# Patient Record
Sex: Female | Born: 1965
Health system: Southern US, Community
[De-identification: ages and names within clinical notes are randomized; demographics above are authoritative.]

## PROBLEM LIST (undated history)

## (undated) DIAGNOSIS — L218 Other seborrheic dermatitis: Secondary | ICD-10-CM

## (undated) DIAGNOSIS — T7840XA Allergy, unspecified, initial encounter: Secondary | ICD-10-CM

## (undated) DIAGNOSIS — R002 Palpitations: Secondary | ICD-10-CM

## (undated) DIAGNOSIS — R141 Gas pain: Secondary | ICD-10-CM

## (undated) DIAGNOSIS — R143 Flatulence: Secondary | ICD-10-CM

## (undated) DIAGNOSIS — R609 Edema, unspecified: Secondary | ICD-10-CM

## (undated) DIAGNOSIS — R4589 Other symptoms and signs involving emotional state: Secondary | ICD-10-CM

## (undated) DIAGNOSIS — J302 Other seasonal allergic rhinitis: Secondary | ICD-10-CM

## (undated) DIAGNOSIS — M8580 Other specified disorders of bone density and structure, unspecified site: Secondary | ICD-10-CM

## (undated) DIAGNOSIS — N390 Urinary tract infection, site not specified: Secondary | ICD-10-CM

## (undated) DIAGNOSIS — I10 Essential (primary) hypertension: Secondary | ICD-10-CM

## (undated) DIAGNOSIS — J029 Acute pharyngitis, unspecified: Secondary | ICD-10-CM

## (undated) DIAGNOSIS — F329 Major depressive disorder, single episode, unspecified: Secondary | ICD-10-CM

## (undated) DIAGNOSIS — K5900 Constipation, unspecified: Secondary | ICD-10-CM

## (undated) DIAGNOSIS — E785 Hyperlipidemia, unspecified: Secondary | ICD-10-CM

## (undated) DIAGNOSIS — N189 Chronic kidney disease, unspecified: Secondary | ICD-10-CM

## (undated) DIAGNOSIS — R142 Eructation: Secondary | ICD-10-CM

## (undated) DIAGNOSIS — F411 Generalized anxiety disorder: Secondary | ICD-10-CM

## (undated) HISTORY — DX: Edema, unspecified: R60.9

## (undated) HISTORY — PX: WISDOM TOOTH EXTRACTION: SHX21

## (undated) HISTORY — DX: Constipation, unspecified: K59.00

## (undated) HISTORY — DX: Other seasonal allergic rhinitis: J30.2

## (undated) HISTORY — DX: Chronic kidney disease, unspecified: N18.9

## (undated) HISTORY — DX: Hyperlipidemia, unspecified: E78.5

## (undated) HISTORY — PX: CHOLECYSTECTOMY: SHX55

## (undated) HISTORY — PX: TONSILLECTOMY: SUR1361

## (undated) HISTORY — DX: Urinary tract infection, site not specified: N39.0

## (undated) HISTORY — DX: Other symptoms and signs involving emotional state: R45.89

## (undated) HISTORY — DX: Other seborrheic dermatitis: L21.8

## (undated) HISTORY — DX: Essential (primary) hypertension: I10

## (undated) HISTORY — PX: ABDOMINAL HYSTERECTOMY: SHX81

## (undated) HISTORY — DX: Major depressive disorder, single episode, unspecified: F32.9

## (undated) HISTORY — DX: Allergy, unspecified, initial encounter: T78.40XA

## (undated) HISTORY — DX: Palpitations: R00.2

## (undated) HISTORY — DX: Gas pain: R14.1

## (undated) HISTORY — DX: Acute pharyngitis, unspecified: J02.9

## (undated) HISTORY — PX: OTHER SURGICAL HISTORY: SHX169

## (undated) HISTORY — PX: APPENDECTOMY: SHX54

## (undated) HISTORY — DX: Eructation: R14.2

## (undated) HISTORY — DX: Other specified disorders of bone density and structure, unspecified site: M85.80

## (undated) HISTORY — DX: Gas pain: R14.3

## (undated) HISTORY — DX: Generalized anxiety disorder: F41.1

---

## 1998-04-25 ENCOUNTER — Other Ambulatory Visit: Admission: RE | Admit: 1998-04-25 | Discharge: 1998-04-25 | Payer: Self-pay | Admitting: Obstetrics and Gynecology

## 1999-04-28 ENCOUNTER — Encounter: Admission: RE | Admit: 1999-04-28 | Discharge: 1999-04-28 | Payer: Self-pay | Admitting: Gynecology

## 1999-04-28 ENCOUNTER — Encounter: Payer: Self-pay | Admitting: Gynecology

## 1999-08-17 ENCOUNTER — Other Ambulatory Visit: Admission: RE | Admit: 1999-08-17 | Discharge: 1999-08-17 | Payer: Self-pay | Admitting: Obstetrics and Gynecology

## 1999-08-29 ENCOUNTER — Other Ambulatory Visit: Admission: RE | Admit: 1999-08-29 | Discharge: 1999-08-29 | Payer: Self-pay | Admitting: Gynecology

## 1999-11-03 ENCOUNTER — Encounter: Payer: Self-pay | Admitting: Gynecology

## 1999-11-03 ENCOUNTER — Encounter: Admission: RE | Admit: 1999-11-03 | Discharge: 1999-11-03 | Payer: Self-pay | Admitting: Gynecology

## 2002-02-03 ENCOUNTER — Encounter: Payer: Self-pay | Admitting: Emergency Medicine

## 2002-02-03 ENCOUNTER — Emergency Department (HOSPITAL_COMMUNITY): Admission: EM | Admit: 2002-02-03 | Discharge: 2002-02-03 | Payer: Self-pay | Admitting: Emergency Medicine

## 2002-07-16 ENCOUNTER — Other Ambulatory Visit: Admission: RE | Admit: 2002-07-16 | Discharge: 2002-07-16 | Payer: Self-pay | Admitting: Obstetrics and Gynecology

## 2003-06-19 ENCOUNTER — Encounter: Payer: Self-pay | Admitting: Family Medicine

## 2003-06-19 LAB — CONVERTED CEMR LAB

## 2003-08-06 ENCOUNTER — Other Ambulatory Visit: Admission: RE | Admit: 2003-08-06 | Discharge: 2003-08-06 | Payer: Self-pay | Admitting: Obstetrics and Gynecology

## 2003-11-23 ENCOUNTER — Encounter: Admission: RE | Admit: 2003-11-23 | Discharge: 2003-11-23 | Payer: Self-pay | Admitting: Obstetrics and Gynecology

## 2004-08-21 ENCOUNTER — Other Ambulatory Visit: Admission: RE | Admit: 2004-08-21 | Discharge: 2004-08-21 | Payer: Self-pay | Admitting: Obstetrics and Gynecology

## 2005-02-27 ENCOUNTER — Encounter (INDEPENDENT_AMBULATORY_CARE_PROVIDER_SITE_OTHER): Payer: Self-pay | Admitting: *Deleted

## 2005-02-27 ENCOUNTER — Observation Stay (HOSPITAL_COMMUNITY): Admission: RE | Admit: 2005-02-27 | Discharge: 2005-02-28 | Payer: Self-pay | Admitting: Obstetrics and Gynecology

## 2005-06-13 ENCOUNTER — Ambulatory Visit: Payer: Self-pay | Admitting: Family Medicine

## 2005-06-22 ENCOUNTER — Ambulatory Visit: Payer: Self-pay | Admitting: Family Medicine

## 2005-07-24 ENCOUNTER — Ambulatory Visit: Payer: Self-pay | Admitting: Family Medicine

## 2005-07-30 ENCOUNTER — Ambulatory Visit: Payer: Self-pay | Admitting: Family Medicine

## 2005-11-06 ENCOUNTER — Ambulatory Visit: Payer: Self-pay | Admitting: Family Medicine

## 2006-01-25 ENCOUNTER — Emergency Department (HOSPITAL_COMMUNITY): Admission: EM | Admit: 2006-01-25 | Discharge: 2006-01-25 | Payer: Self-pay | Admitting: Emergency Medicine

## 2006-01-26 ENCOUNTER — Ambulatory Visit: Payer: Self-pay | Admitting: Internal Medicine

## 2006-01-28 ENCOUNTER — Ambulatory Visit: Payer: Self-pay | Admitting: Family Medicine

## 2006-02-12 ENCOUNTER — Encounter (HOSPITAL_BASED_OUTPATIENT_CLINIC_OR_DEPARTMENT_OTHER): Admission: RE | Admit: 2006-02-12 | Discharge: 2006-05-13 | Payer: Self-pay | Admitting: Surgery

## 2006-06-06 ENCOUNTER — Encounter (HOSPITAL_BASED_OUTPATIENT_CLINIC_OR_DEPARTMENT_OTHER): Admission: RE | Admit: 2006-06-06 | Discharge: 2006-07-17 | Payer: Self-pay | Admitting: Surgery

## 2006-06-11 ENCOUNTER — Emergency Department (HOSPITAL_COMMUNITY): Admission: EM | Admit: 2006-06-11 | Discharge: 2006-06-11 | Payer: Self-pay | Admitting: Emergency Medicine

## 2006-07-23 ENCOUNTER — Emergency Department (HOSPITAL_COMMUNITY): Admission: EM | Admit: 2006-07-23 | Discharge: 2006-07-23 | Payer: Self-pay | Admitting: Family Medicine

## 2006-09-18 ENCOUNTER — Encounter: Payer: Self-pay | Admitting: Family Medicine

## 2006-09-18 DIAGNOSIS — I10 Essential (primary) hypertension: Secondary | ICD-10-CM | POA: Insufficient documentation

## 2006-09-18 DIAGNOSIS — F411 Generalized anxiety disorder: Secondary | ICD-10-CM

## 2006-09-18 DIAGNOSIS — R002 Palpitations: Secondary | ICD-10-CM

## 2006-09-18 DIAGNOSIS — F329 Major depressive disorder, single episode, unspecified: Secondary | ICD-10-CM | POA: Insufficient documentation

## 2006-09-18 DIAGNOSIS — F3289 Other specified depressive episodes: Secondary | ICD-10-CM

## 2006-09-18 HISTORY — DX: Essential (primary) hypertension: I10

## 2006-09-18 HISTORY — DX: Other specified depressive episodes: F32.89

## 2006-09-18 HISTORY — DX: Palpitations: R00.2

## 2006-09-18 HISTORY — DX: Generalized anxiety disorder: F41.1

## 2006-09-18 HISTORY — DX: Major depressive disorder, single episode, unspecified: F32.9

## 2006-12-23 ENCOUNTER — Ambulatory Visit: Payer: Self-pay | Admitting: Family Medicine

## 2006-12-23 DIAGNOSIS — R609 Edema, unspecified: Secondary | ICD-10-CM

## 2006-12-23 DIAGNOSIS — R4589 Other symptoms and signs involving emotional state: Secondary | ICD-10-CM

## 2006-12-23 HISTORY — DX: Other symptoms and signs involving emotional state: R45.89

## 2007-01-06 ENCOUNTER — Ambulatory Visit: Payer: Self-pay | Admitting: Family Medicine

## 2007-01-06 DIAGNOSIS — L218 Other seborrheic dermatitis: Secondary | ICD-10-CM

## 2007-01-06 HISTORY — DX: Other seborrheic dermatitis: L21.8

## 2007-01-29 ENCOUNTER — Encounter: Admission: RE | Admit: 2007-01-29 | Discharge: 2007-01-29 | Payer: Self-pay | Admitting: Obstetrics and Gynecology

## 2007-02-25 ENCOUNTER — Telehealth: Payer: Self-pay | Admitting: Family Medicine

## 2007-03-13 ENCOUNTER — Ambulatory Visit: Payer: Self-pay | Admitting: Cardiology

## 2007-03-25 ENCOUNTER — Encounter: Payer: Self-pay | Admitting: Family Medicine

## 2007-03-25 ENCOUNTER — Ambulatory Visit: Payer: Self-pay

## 2007-05-12 ENCOUNTER — Telehealth: Payer: Self-pay | Admitting: Family Medicine

## 2007-07-14 ENCOUNTER — Telehealth: Payer: Self-pay | Admitting: Family Medicine

## 2007-09-01 ENCOUNTER — Telehealth: Payer: Self-pay | Admitting: Family Medicine

## 2007-09-22 ENCOUNTER — Telehealth: Payer: Self-pay | Admitting: Family Medicine

## 2008-02-26 ENCOUNTER — Ambulatory Visit: Payer: Self-pay | Admitting: Internal Medicine

## 2008-04-27 ENCOUNTER — Telehealth: Payer: Self-pay | Admitting: Internal Medicine

## 2008-04-28 ENCOUNTER — Encounter: Admission: RE | Admit: 2008-04-28 | Discharge: 2008-04-28 | Payer: Self-pay | Admitting: Obstetrics and Gynecology

## 2008-05-10 ENCOUNTER — Ambulatory Visit: Payer: Self-pay | Admitting: Internal Medicine

## 2008-05-10 LAB — CONVERTED CEMR LAB
Albumin: 3.9 g/dL (ref 3.5–5.2)
BUN: 16 mg/dL (ref 6–23)
Bilirubin, Direct: 0.1 mg/dL (ref 0.0–0.3)
Calcium: 9.7 mg/dL (ref 8.4–10.5)
Cholesterol: 202 mg/dL (ref 0–200)
Eosinophils Absolute: 0.2 10*3/uL (ref 0.0–0.7)
GFR calc Af Amer: 101 mL/min
GFR calc non Af Amer: 84 mL/min
Glucose, Bld: 89 mg/dL (ref 70–99)
HCT: 41.1 % (ref 36.0–46.0)
Hemoglobin: 14.2 g/dL (ref 12.0–15.0)
MCV: 88.2 fL (ref 78.0–100.0)
Monocytes Absolute: 0.3 10*3/uL (ref 0.1–1.0)
Monocytes Relative: 6.3 % (ref 3.0–12.0)
Neutro Abs: 2.7 10*3/uL (ref 1.4–7.7)
Platelets: 174 10*3/uL (ref 150–400)
RDW: 11.6 % (ref 11.5–14.6)
Sodium: 143 meq/L (ref 135–145)
TSH: 1.74 microintl units/mL (ref 0.35–5.50)
Total CHOL/HDL Ratio: 4.7
Total Protein: 6.9 g/dL (ref 6.0–8.3)
Triglycerides: 84 mg/dL (ref 0–149)

## 2008-09-21 ENCOUNTER — Encounter: Payer: Self-pay | Admitting: Family Medicine

## 2008-10-07 ENCOUNTER — Emergency Department (HOSPITAL_COMMUNITY): Admission: EM | Admit: 2008-10-07 | Discharge: 2008-10-07 | Payer: Self-pay | Admitting: Emergency Medicine

## 2008-10-07 ENCOUNTER — Encounter: Payer: Self-pay | Admitting: Gastroenterology

## 2008-10-08 ENCOUNTER — Emergency Department (HOSPITAL_COMMUNITY): Admission: EM | Admit: 2008-10-08 | Discharge: 2008-10-08 | Payer: Self-pay | Admitting: Emergency Medicine

## 2008-10-11 ENCOUNTER — Telehealth: Payer: Self-pay | Admitting: Gastroenterology

## 2008-10-11 ENCOUNTER — Encounter: Payer: Self-pay | Admitting: Internal Medicine

## 2008-10-11 ENCOUNTER — Ambulatory Visit: Payer: Self-pay | Admitting: Internal Medicine

## 2008-10-11 ENCOUNTER — Telehealth: Payer: Self-pay | Admitting: Internal Medicine

## 2008-10-11 DIAGNOSIS — R1084 Generalized abdominal pain: Secondary | ICD-10-CM | POA: Insufficient documentation

## 2008-10-11 DIAGNOSIS — R141 Gas pain: Secondary | ICD-10-CM | POA: Insufficient documentation

## 2008-10-11 DIAGNOSIS — K5900 Constipation, unspecified: Secondary | ICD-10-CM | POA: Insufficient documentation

## 2008-10-11 DIAGNOSIS — R143 Flatulence: Secondary | ICD-10-CM

## 2008-10-11 DIAGNOSIS — R142 Eructation: Secondary | ICD-10-CM

## 2008-10-12 ENCOUNTER — Telehealth: Payer: Self-pay | Admitting: Physician Assistant

## 2008-10-12 LAB — CONVERTED CEMR LAB
CO2: 32 meq/L (ref 19–32)
Chloride: 106 meq/L (ref 96–112)
Eosinophils Relative: 2.5 % (ref 0.0–5.0)
Monocytes Relative: 2.6 % — ABNORMAL LOW (ref 3.0–12.0)
Neutrophils Relative %: 70.3 % (ref 43.0–77.0)
Platelets: 182 10*3/uL (ref 150.0–400.0)
Potassium: 4.1 meq/L (ref 3.5–5.1)
RBC: 4.68 M/uL (ref 3.87–5.11)
Sodium: 143 meq/L (ref 135–145)
TSH: 1.67 microintl units/mL (ref 0.35–5.50)
WBC: 7.5 10*3/uL (ref 4.5–10.5)

## 2008-10-18 ENCOUNTER — Telehealth: Payer: Self-pay | Admitting: Physician Assistant

## 2008-11-18 ENCOUNTER — Ambulatory Visit: Payer: Self-pay | Admitting: Internal Medicine

## 2008-11-18 DIAGNOSIS — N390 Urinary tract infection, site not specified: Secondary | ICD-10-CM

## 2008-11-18 LAB — CONVERTED CEMR LAB
Bilirubin Urine: NEGATIVE
Ketones, urine, test strip: NEGATIVE
Protein, U semiquant: 30
Urobilinogen, UA: 0.2

## 2009-03-21 ENCOUNTER — Telehealth: Payer: Self-pay | Admitting: Family Medicine

## 2009-04-08 ENCOUNTER — Ambulatory Visit: Payer: Self-pay | Admitting: Internal Medicine

## 2009-05-02 ENCOUNTER — Encounter: Admission: RE | Admit: 2009-05-02 | Discharge: 2009-05-02 | Payer: Self-pay | Admitting: Obstetrics and Gynecology

## 2009-05-10 ENCOUNTER — Encounter: Admission: RE | Admit: 2009-05-10 | Discharge: 2009-05-10 | Payer: Self-pay | Admitting: Obstetrics and Gynecology

## 2009-05-30 ENCOUNTER — Emergency Department (HOSPITAL_COMMUNITY): Admission: EM | Admit: 2009-05-30 | Discharge: 2009-05-30 | Payer: Self-pay | Admitting: Family Medicine

## 2009-05-30 ENCOUNTER — Telehealth: Payer: Self-pay | Admitting: Cardiology

## 2009-07-04 ENCOUNTER — Ambulatory Visit: Payer: Self-pay | Admitting: Internal Medicine

## 2009-07-04 DIAGNOSIS — J069 Acute upper respiratory infection, unspecified: Secondary | ICD-10-CM | POA: Insufficient documentation

## 2009-07-08 ENCOUNTER — Ambulatory Visit: Payer: Self-pay | Admitting: Cardiology

## 2009-08-16 ENCOUNTER — Ambulatory Visit: Payer: Self-pay | Admitting: Cardiology

## 2009-08-16 LAB — CONVERTED CEMR LAB
CO2: 33 meq/L — ABNORMAL HIGH (ref 19–32)
GFR calc non Af Amer: 82.94 mL/min (ref >60)
Glucose, Bld: 60 mg/dL — ABNORMAL LOW (ref 70–99)
Potassium: 3.9 meq/L (ref 3.5–5.1)
Sodium: 141 meq/L (ref 135–145)

## 2009-10-05 ENCOUNTER — Telehealth: Payer: Self-pay | Admitting: Cardiology

## 2009-10-27 ENCOUNTER — Telehealth: Payer: Self-pay | Admitting: Internal Medicine

## 2010-01-09 ENCOUNTER — Telehealth: Payer: Self-pay | Admitting: Internal Medicine

## 2010-05-10 ENCOUNTER — Telehealth: Payer: Self-pay | Admitting: Internal Medicine

## 2010-07-06 ENCOUNTER — Telehealth: Payer: Self-pay | Admitting: Internal Medicine

## 2010-07-09 ENCOUNTER — Encounter: Payer: Self-pay | Admitting: Obstetrics and Gynecology

## 2010-07-09 ENCOUNTER — Encounter: Payer: Self-pay | Admitting: Rheumatology

## 2010-07-12 ENCOUNTER — Encounter: Payer: Self-pay | Admitting: Internal Medicine

## 2010-07-12 ENCOUNTER — Ambulatory Visit
Admission: RE | Admit: 2010-07-12 | Discharge: 2010-07-12 | Payer: Self-pay | Source: Home / Self Care | Attending: Internal Medicine | Admitting: Internal Medicine

## 2010-07-12 DIAGNOSIS — R002 Palpitations: Secondary | ICD-10-CM

## 2010-07-12 DIAGNOSIS — I10 Essential (primary) hypertension: Secondary | ICD-10-CM

## 2010-07-12 NOTE — Assessment & Plan Note (Signed)
Palpitations- she remains asymptomatic.  Will continue to follow clinically

## 2010-07-12 NOTE — Progress Notes (Signed)
Subjective:     Patient ID: Molly Peterson is a 45 y.o. female.  HPI  45 year old patient who has a history of hypertension, and this has been well-controlled on low-dose less pill, as well as diuretic therapy.  She also has issues with fluid retention.  Blood pressure readings are checked occasionally and have been normal.  She has a history of palpitations that have been stable.  Her only concerns today are occasional fluid retention issues associated with weight gain.  She has begun working full time but in general, feels her stress and anxiety are much improved.  She has no depression.  The following portions of the patient's history were reviewed and updated as appropriate: allergies, current medications, past family history, past medical history, past social history, past surgical history and problem list.  Review of Systems  Constitutional: Negative.  Negative for appetite change.  HENT: Negative for hearing loss, congestion, sore throat, sinus pressure and tinnitus.   Eyes: Negative for pain, discharge and visual disturbance.  Respiratory: Negative for cough and shortness of breath.   Cardiovascular: Negative for chest pain, palpitations (rare) and leg swelling.       Rare palpitations  Gastrointestinal: Negative for nausea, vomiting, diarrhea, constipation, blood in stool and abdominal distention.  Genitourinary: Negative for frequency, hematuria, flank pain, vaginal bleeding, vaginal discharge, difficulty urinating and pelvic pain.       Status post hysterectomy  Musculoskeletal: Negative for joint swelling, arthralgias and gait problem.  Neurological: Negative for dizziness, syncope, speech difficulty, weakness, numbness and headaches.  Hematological: Negative for adenopathy. Does not bruise/bleed easily.  Psychiatric/Behavioral: Negative for behavioral problems and agitation.       Objective:   Physical Exam  Constitutional: She is oriented to person, place, and time. She  appears well-developed and well-nourished.  HENT:  Head: Normocephalic.  Right Ear: External ear normal.  Left Ear: External ear normal.  Mouth/Throat: Oropharynx is clear and moist.  Eyes: Conjunctivae and EOM are normal. Pupils are equal, round, and reactive to light.  Neck: Normal range of motion. Neck supple. No thyromegaly present.  Cardiovascular: Normal rate, regular rhythm, normal heart sounds and intact distal pulses.   Pulmonary/Chest: Effort normal and breath sounds normal.  Abdominal: Soft. Bowel sounds are normal. She exhibits no mass. There is no tenderness.  Musculoskeletal: Normal range of motion. She exhibits no edema.  Lymphadenopathy:    She has no cervical adenopathy.  Neurological: She is alert and oriented to person, place, and time.  Skin: Skin is warm and dry. No rash noted.  Psychiatric: She has a normal mood and affect. Her behavior is normal.       Assessment:     Impression hypertension, stable Palpitations stable Edema- reasonably well  controlled on diuretic therapy    Plan:

## 2010-07-12 NOTE — Assessment & Plan Note (Signed)
Hypertension-well controlled on present therapy.  Will continue Aldactone and lisinopril.  Will encourage a restricted salt diet, exercise and modest weight loss

## 2010-07-12 NOTE — Patient Instructions (Signed)
Return in one year for follow-up Limit your sodium (Salt) intake Exercise Please check your blood pressure on a regular basis.  If it is consistently greater than 150/90, please make an office appointment.

## 2010-07-18 ENCOUNTER — Telehealth: Payer: Self-pay | Admitting: Internal Medicine

## 2010-07-20 NOTE — Progress Notes (Signed)
Summary: refill spironolact. - denied  Phone Note Refill Request Message from:  Fax from Pharmacy on July 06, 2010 11:22 AM  Refills Requested: Medication #1:  ALDACTONE 25 MG  TABS 1 by mouth qam   [facility]   Initial call taken by: Duard Brady LPN,  July 06, 2010 11:23 AM  Follow-up for Phone Call        needs to be seen - last seen 03/2009 [facility] - denied Follow-up by: Duard Brady LPN,  July 06, 2010 11:23 AM

## 2010-07-20 NOTE — Progress Notes (Signed)
Summary: refill xanax  Phone Note Refill Request Message from:  Fax from Pharmacy on May 10, 2010 11:53 AM  Refills Requested: Medication #1:  ALPRAZOLAM 0.25 MG TABS 1 every 8 hours as needed.   Last Ref[facility]: [facility]/09/2010 [facility]   Method Requested: Fax to Local Pharmacy Initial call taken by: Duard Brady LPN,  May 10, 2010 11:53 AM  Follow-up for Phone Call        #60 RF 3 Follow-up by: Gordy Savers  MD,  May 10, 2010 11:59 AM    Prescriptions: ALPRAZOLAM 0.25 MG TABS (ALPRAZOLAM) 1 every 8 hours as needed  #60 x 3   Entered by:   Duard Brady LPN   Authorized by:   Gordy Savers  MD   Signed by:   Duard Brady LPN on 40/98/1191   Method used:   Historical   RxID:   [id number]  [facility]

## 2010-07-20 NOTE — Progress Notes (Signed)
Summary: pt needs refill asap   Phone Note Refill Request Call back at Work Phone [phone] Message from:  Patient on [facility] [address]  Refills Requested: Medication #1:  xanax Initial call taken by: Omer Jack,  October 05, 2009 9:18 AM  Follow-up for Phone Call        CMA s/w TW to verify Rx for alprazolam, said did not remember filling Rx but go ahead and fill this time w/1 refill and advise pt to f/u w/Dr. Eleonore Chiquito.  Alprazolam called in today. Danielle Rankin, CMA  October 05, 2009 9:30 AM
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
## 2010-07-20 NOTE — Progress Notes (Signed)
Summary: REFILL REQUEST (Alprazolam / Xanax)  Phone Note Refill Request Message from:  Patient on Oct 27, 2009 9:31 AM  Refills Requested: Medication #1:  ALPRAZOLAM 0.25 MG TABS 1 every 8 hours as needed.   Notes: Pt adv Rx will be maintained by Dr Kirtland Bouchard per Dr. Valera Castle.... Pt req refill Rx (#30) be sent to [facility] [place].    Initial call taken by: Debbra Riding,  Oct 27, 2009 9:32 AM  Follow-up for Phone Call        OK  #60 Follow-up by: Gordy Savers  MD,  Oct 27, 2009 9:42 AM    Prescriptions: ALPRAZOLAM 0.25 MG TABS (ALPRAZOLAM) 1 every 8 hours as needed  #60 x 0   Entered by:   Duard Brady LPN   Authorized by:   Gordy Savers  MD   Signed by:   Duard Brady LPN on 16/03/9603   Method used:   Historical   RxID:   [id number]  [facility]

## 2010-07-20 NOTE — Progress Notes (Signed)
Summary: refill alrazolam  Phone Note Refill Request Message from:  Fax from Pharmacy on January 09, 2010 11:35 AM  Refills Requested: Medication #1:  ALPRAZOLAM 0.25 MG TABS 1 every 8 hours as needed.   Last Refilled: 10/27/2009 [facility]   [phone]   Method Requested: Telephone to Pharmacy Initial call taken by: Duard Brady LPN,  January 09, 2010 11:36 AM    Prescriptions: ALPRAZOLAM 0.25 MG TABS (ALPRAZOLAM) 1 every 8 hours as needed  #60 x 0   Entered by:   Duard Brady LPN   Authorized by:   Gordy Savers  MD   Signed by:   Duard Brady LPN on 78/29/5621   Method used:   Historical   RxID:   [id number]  [facility]

## 2010-07-20 NOTE — Assessment & Plan Note (Signed)
Summary: fu on med/njr   Vital Signs:  Patient profile:   45 year old female Height:      63 inches Weight:      137 pounds BMI:     24.36 Temp:     97.9 degrees F oral BP sitting:   100 / 64  (right arm) Cuff size:   regular  Vitals Entered By: Duard Brady LPN (July 12, 2010 11:39 AM) CC: medication review - needs refills Is Patient Diabetic? No   Primary Care Provider:  Eleonore Chiquito  CC:  medication review - needs refills.  History of Present Illness: Patient ID: Molly Peterson is a 45 y.o. female.  HPI  45 year old patient who has a history of hypertension, and this has been well-controlled on low-dose less pill, as well as diuretic therapy.  She also has issues with fluid retention.  Blood pressure readings are checked occasionally and have been normal.  She has a history of palpitations that have been stable.  Her only concerns today are occasional fluid retention issues associated with weight gain.  She has begun working full time but in general, feels her stress and anxiety are much improved.  She has no depression.  The following portions of the patient's history were reviewed and updated as appropriate: allergies, current medications, past family history, past medical history, past social history, past surgical history and problem list.  Review of Systems  Constitutional: Negative.  Negative for appetite change.  HENT: Negative for hearing loss, congestion, sore throat, sinus pressure and tinnitus.   Eyes: Negative for pain, discharge and visual disturbance.  Respiratory: Negative for cough and shortness of breath.   Cardiovascular: Negative for chest pain, palpitations (rare) and leg swelling.       Rare palpitations  Gastrointestinal: Negative for nausea, vomiting, diarrhea, constipation, blood in stool and abdominal distention.  Genitourinary: Negative for frequency, hematuria, flank pain, vaginal bleeding, vaginal discharge, difficulty urinating and  pelvic pain.       Status post hysterectomy  Musculoskeletal: Negative for joint swelling, arthralgias and gait problem.  Neurological: Negative for dizziness, syncope, speech difficulty, weakness, numbness and headaches.  Hematological: Negative for adenopathy. Does not bruise/bleed easily.  Psychiatric/Behavioral: Negative for behavioral problems and agitation.   Physical Exam  Constitutional: She is oriented to person, place, and time. She appears well-developed and well-nourished.  HENT:  Head: Normocephalic.  Right Ear: External ear normal.  Left Ear: External ear normal.  Mouth/Throat: Oropharynx is clear and moist.  Eyes: Conjunctivae and EOM are normal. Pupils are equal, round, and reactive to light.  Neck: Normal range of motion. Neck supple. No thyromegaly present.  Cardiovascular: Normal rate, regular rhythm, normal heart sounds and intact distal pulses.   Pulmonary/Chest: Effort normal and breath sounds normal.  Abdominal: Soft. Bowel sounds are normal. She exhibits no mass. There is no tenderness.  Musculoskeletal: Normal range of motion. She exhibits no edema.  Lymphadenopathy:    She has no cervical adenopathy.  Neurological: She is alert and oriented to person, place, and time.  Skin: Skin is warm and dry. No rash noted.  Psychiatric: She has a normal mood and affect. Her behavior is normal.    Preventive Screening-Counseling & Management  Alcohol-Tobacco     Smoking Status: quit     Year Quit: 2004  Allergies: No Known Drug Allergies   Impression & Recommendations:  Problem # 1:  HYPERTENSION (ICD-401.9)  Her updated medication list for this problem includes:  Aldactone 25 Mg Tabs (Spironolactone) .Marland Kitchen... 1 by mouth qam    Lisinopril 10 Mg Tabs (Lisinopril) .Marland Kitchen... 1 once daily  Problem # 2:  PALPITATIONS (ICD-785.1)  Complete Medication List: 1)  Aldactone 25 Mg Tabs (Spironolactone) .Marland Kitchen.. 1 by mouth qam 2)  Lisinopril 10 Mg Tabs (Lisinopril) .Marland Kitchen.. 1  once daily 3)  Alprazolam 0.25 Mg Tabs (Alprazolam) .Marland Kitchen.. 1 every 8 hours as needed  Patient Instructions: 1)  Please schedule a follow-up appointment in 1 year. 2)  Limit your Sodium (Salt). 3)  It is important that you exercise regularly at least 20 minutes 5 times a week. If you develop chest pain, have severe difficulty breathing, or feel very tired , stop exercising immediately and seek medical attention. 4)  Check your Blood Pressure regularly. If it is above: 150/90 you should make an appointment. Prescriptions: ALPRAZOLAM 0.25 MG TABS (ALPRAZOLAM) 1 every 8 hours as needed  #60 x 3   Entered and Authorized by:   Gordy Savers  MD   Signed by:   Gordy Savers  MD on 07/12/2010   Method used:   Print then Give to Patient   RxID:   [id number] LISINOPRIL 10 MG TABS (LISINOPRIL) 1 once daily  #90 x 11   Entered and Authorized by:   Gordy Savers  MD   Signed by:   Gordy Savers  MD on 07/12/2010   Method used:   Print then Give to Patient   RxID:   [id number] ALDACTONE 25 MG  TABS (SPIRONOLACTONE) 1 by mouth qam  #90 x 4   Entered and Authorized by:   Gordy Savers  MD   Signed by:   Gordy Savers  MD on 07/12/2010   Method used:   Print then Give to Patient   RxID:   [id number]    Orders Added: 1)  Est. Patient Level III [95638]
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
## 2010-07-26 NOTE — Progress Notes (Signed)
Summary: REFILL REQUEST  Phone Note Refill Request Message from:  Patient on July 18, 2010 8:49 AM  Refills Requested: Medication #1:  ALDACTONE 25 MG  TABS 1 by mouth qam   Notes: [facility] [address]...Marland Kitchen[facility] KitchenMarland Kitchen Pt left Rx at home and needs it to be sent into pharmacy.  Medication #2:  LISINOPRIL 10 MG TABS 1 once daily   Notes: [facility] [place]...Marland KitchenMarland KitchenMarland Kitchen Pt left Rx at home and needs it to be sent into pharmacy.    Initial call taken by: Debbra Riding,  July 18, 2010 8:51 AM  Follow-up for Phone Call        attempt to call - VM - LMTCB if questions - 30 day rx given - pt has printed rx's with RF from appt last week. KIK Follow-up by: Duard Brady LPN,  July 18, 2010 9:02 AM    Prescriptions: LISINOPRIL 10 MG TABS (LISINOPRIL) 1 once daily  #30 x 0   Entered by:   Duard Brady LPN   Authorized by:   Gordy Savers  MD   Signed by:   Duard Brady LPN on 59/56/3875   Method used:   Electronically to        [facility] [place] [address] (retail)       [address]       [place], [place]  [address]       Ph: [phone]       Fax: [phone]   RxID:   [id number] ALDACTONE 25 MG  TABS (SPIRONOLACTONE) 1 by mouth qam  #30 x 0   Entered by:   Duard Brady LPN   Authorized by:   Gordy Savers  MD   Signed by:   Duard Brady LPN on 23/76/2831   Method used:   Electronically to        [facility] [place] [facility] (retail)       [address]       [place], [place]  [address]       Ph: [phone]       Fax: [phone]   RxID:   [id number]

## 2010-08-23 ENCOUNTER — Telehealth: Payer: Self-pay | Admitting: Internal Medicine

## 2010-08-23 MED ORDER — ALPRAZOLAM 0.25 MG PO TABS: 0.2500 mg | ORAL_TABLET | Freq: Three times a day (TID) | ORAL | Status: DC | PRN

## 2010-08-23 MED ORDER — SPIRONOLACTONE 25 MG PO TABS: 25.0000 mg | ORAL_TABLET | Freq: Every day | ORAL | Status: DC

## 2010-08-23 MED ORDER — LISINOPRIL 10 MG PO TABS: 10.0000 mg | ORAL_TABLET | Freq: Every day | ORAL | Status: DC

## 2010-08-23 NOTE — Telephone Encounter (Signed)
Pt has lost Rx for aldactone 25mg  lisinopril 10mg , alprazolam .25mg .....  All were on the same paper and she didn't have to get it filled right away and has lost it since her last appt....[facility] [address].... Pt is out of fluid pill and needs the refills asap please.

## 2010-08-23 NOTE — Telephone Encounter (Signed)
[facility] - could not get thru to actual person -   Attempt to call pt - ans mach - LMTCB if questions refills called in [facility]

## 2010-08-23 NOTE — Telephone Encounter (Signed)
Pt called to check on status of refill. Pt went to [facility] [address] and nothing has been sent in yet. Aldactone 25 mg, Lisinipril 10 mg and Alprazolam .25mg .

## 2010-08-23 NOTE — Telephone Encounter (Signed)
Was seen in office 07/12/10 - please advise on reorder of these meds. KIK

## 2010-08-23 NOTE — Telephone Encounter (Signed)
Addended by: Duard Brady on: 08/23/2010 04:37 PM   Modules accepted: Orders

## 2010-08-23 NOTE — Telephone Encounter (Signed)
Ok to RF? 

## 2010-08-28 ENCOUNTER — Telehealth: Payer: Self-pay | Admitting: Internal Medicine

## 2010-08-28 NOTE — Telephone Encounter (Signed)
ok 

## 2010-08-28 NOTE — Telephone Encounter (Signed)
Pt was told to get a pro-biotic otc. Pt will try align ok per doc jenkins

## 2010-09-20 ENCOUNTER — Telehealth: Payer: Self-pay | Admitting: Cardiology

## 2010-09-20 NOTE — Telephone Encounter (Signed)
Pt states last night her left arm went to sleep it was completely numb.

## 2010-10-06 ENCOUNTER — Ambulatory Visit: Payer: Self-pay | Admitting: Cardiology

## 2010-10-31 NOTE — Assessment & Plan Note (Signed)
[facility]                            CARDIOLOGY OFFICE NOTE   NAME:Peterson, Molly LEMIRE                    MRN:          [id number]  DATE:03/13/2007                            DOB:          [birth year]    I was asked by Dr. Milinda Antis to evaluate Molly Peterson with some chest  discomfort.   HISTORY OF PRESENT ILLNESS:  Molly Peterson is a 45 year old married white  female, mother of 2, who comes today with the above complaint. About  three weeks ago on a very stressful day, she had a sudden onset of chest  tightness with tingling in her left arm. She had to lay down for about  30 to 45 minutes for it to resolve.   She has been out of her exercise routine because of increased work and  stress loads. Her gym also closed. When she was exercising, she denied  any chest tightness or shortness of breath.   Her risk factors are hypertension since age 75. Her father had an MI at  age 8, but he smoked and had hypertension.   She smoked socially up until 2005. She was not a heavy smoker.   MEDICATIONS:  She is currently on:  1. Zoloft 50 mg daily.  2. Aldactone 25 mg daily.  3. Lisinopril/hydrochlorothiazide 10/12.5 mg daily.   She drinks a couple of beers per month. She has a couple of glasses of  caffeinated beverage per day.   SURGICAL HISTORY:  Tonsillectomy in 1988, appendectomy in 1991, C-  section in 1997 and 1998, hysterectomy in 2006.   FAMILY HISTORY:  As above.   SOCIAL HISTORY:  She is married and has two children. They are 10 and  11. One was just diagnosed with a medical condition which has led to  stress. 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 Workload has increased lately.   REVIEW OF SYSTEMS:  Other than the HPI is really negative.   PHYSICAL EXAMINATION:  VITAL SIGNS:  Blood pressure 114/88, pulse 78 and  regular. EKG is normal except for poor R-wave progression across the  anterior precordium.  She is 5 foot 3 inches and weighs 126 pounds.  HEENT:  Normocephalic atraumatic, PERRLA, extraocular movements intact,  sclerae clear, facial symmetry is normal.  NECK:  Carotid upstrokes are equally bilaterally without bruits. There  is no JVD. Thyroid is not enlarged. Trachea is midline.  LUNGS:  Clear to auscultation and percussion.  HEART:  Regular rate and rhythm, PMI is non-displaced. She has no click.  S2 splits physiologically.  ABDOMEN:  Soft with no midline or flank bruit. No hepatomegaly.  EXTREMITIES:  No cyanosis, clubbing, or edema. Pulses are present.  NEUROLOGIC:  Intact.   ASSESSMENT AND PLAN:  Chest tightness with arm tingling in a stressful  situation. She does have some risk factors, particularly her  hypertension since age 67. Her family history is not purely present, but  it bothers me that her father had an MI at age 17.   RECOMMENDATIONS:  1. Exercise rest stress Myoview to  rule out any obstructive coronary      disease.  2. Once we are clear on her stress Myoview, regular exercise three      hours per week.  3. Continue current blood pressure medicines.   She tells me that her lipids were checked by Dr. Rana Snare and were normal. I  will trust her on this. We will see her back on a p.r.n. basis.     Thomas C. Daleen Squibb, MD, [facility]  Electronically Signed    TCW/MedQ  DD: 03/13/2007  DT: 03/14/2007  Job #: [id number]   cc:   Marne A. Milinda Antis, MD  Dineen Kid. Rana Snare, M.D.
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
## 2010-11-03 NOTE — Op Note (Signed)
Molly Peterson, Molly Peterson             ACCOUNT NO.:  [id number]   MEDICAL RECORD NO.:  [id number]          PATIENT TYPE:  INP   LOCATION:  9399                          FACILITY:  [facility]   PHYSICIAN:  Dineen Kid. Rana Snare, M.D.    DATE OF BIRTH:  05-23-1966   DATE OF PROCEDURE:  02/27/2005  DATE OF DISCHARGE:                                 OPERATIVE REPORT   PREOPERATIVE DIAGNOSES:  1.  Menometrorrhagia, pelvic pain.  2.  History of recurrent left ovarian cyst.  3.  All not responsive to medical management.   POSTOPERATIVE DIAGNOSES:  1.  Menometrorrhagia, pelvic pain.  2.  History of recurrent left ovarian cyst.  3.  All not responsive to medical management.   OPERATION/PROCEDURE:  Laparoscopic-assisted vaginal hysterectomy.   SURGEON:  Dineen Kid. Rana Snare, M.D.   ASSISTANT:  Zelphia Cairo, M.D.   ANESTHESIA:  General endotracheal anesthesia.   INDICATIONS:  [name] is a 45 year old, G2, P2, with worsening  menometrorrhagia, dysmenorrhea, and pelvic pain with a predilection towards  the left adnexa. She has a history of recurrent left ovarian cyst.  She has  tried multiple conservative medical managements including oral contraceptive  agents, anti-inflammatory medications and narcotic relief without much  benefit.  She desires surgical intervention.  Plan laparoscopic-assisted  vaginal hysterectomy with possible removal of left tube and ovary.  The  risks and benefits were discussed at length and informed consent was  obtained.  See history and physical for further details.   FINDINGS:  Normal-appearing left and right ovary.  No evidence of adhesions  or endometriosis.  Uterus is slightly enlarged with the appearance of  adenomyosis.  Liver was normal in appearance.  The appendix was surgically  absent.   DESCRIPTION OF PROCEDURE:  After adequate analgesia, the patient was placed  in the dorsal lithotomy position. She was sterilely prepped and draped.  Bladder was sterilely drained.   The Hulka tenaculum was placed on the  cervix.  A 1 cm infraumbilical skin incision was made.  A Veress needle was  inserted.  The abdomen is insufflated.  Dullness to percussion.  An 11 mm  trocar was inserted. Laparoscope was then inserted.  A 5 mm trocar was  inserted left of the midline two fingerbreadths above the pubic symphysis  under direct visualization.  A Gyrus tripolar was used to ligate across the  right utero-ovarian ligament with good hemostasis achieved and dissected  across round ligament to the inferior portion of the broad ligament.  The  left utero-ovarian ligament was ligated and dissected similarly across the  inferior portion of the broad ligament.  The bladder flap was created at the  vesicouterine junction.  The abdomen was then desufflated, legs  repositioned.   Weighted speculum was placed in the vagina.  A Jacobs tenaculum was placed  on the posterior cervix.  A posterior colpotomy was performed.  Cervix was  circumscribed with the Bovie cautery.  A LigaSure instrument was used to  ligate across the uterosacral ligaments bilaterally and dissected with the  Mayo scissors.  The cardinal ligaments were coagulated in similar fashion as  were the bladder pillars and dissected with the Mayo scissors.  Anterior  fascial mucosa was dissected off the anterior surface of the cervix.  Anterior peritoneum was entered sharply and a Deaver retractor was placed  below the bladder.  The inferior portion of the broad ligament was then  clamped and coagulated and dissected with Mayo scissors.  The uterus was  removed.  A Rubin pack was placed intra-abdominally. The uterosacral  ligaments were identified, suture ligated with 0 Monocryl sutures in figure-  of-eight fashion.  Posterior peritoneum was closed with a 0 Monocryl in a  pursestring fashion.  The uterosacral ligaments were then plicated in the  midline using figure-of-eight 0 Monocryl.  The posterior vaginal mucosa was   closed in a vertical fashion using a figure-of-eight and 0 Monocryl.  The  Rubin pack was removed and the anterior vaginal mucosa was closed in similar  fashion using figure-of-eight of 0 Monocryl.  Foley catheter was then placed  with return of clear yellow urine.  The legs were then repositioned, abdomen  reinsufflated, ______ suction irrigator was inserted for copious amounts of  irrigation.  Hemostasis was achieved using bipolar portion of the Gyrus with  good hemostasis achieved throughout the cul-de-sac and the pedicles.  After  careful examination and copious amounts of irrigation, adequate hemostasis  was assured.  The trocars were removed.  The infraumbilical skin incision  was closed with interrupted suture of 0 Vicryl in a figure-of-eight fashion  in the fascia and 3-0 Vicryl raphed subcuticular suture on the skin.  The 5  mm site was closed with a 3-0 Vicryl raphed subcuticular suture with good  approximation and good hemostasis.  The incisions were injected with 0.25%  Marcaine, a total  of 10 mL used.  Sponge and instrument counts correct x3.  Estimated blood loss 300 mL.  The patient received 1 g of Rocephin  preoperatively.      Dineen Kid Rana Snare, M.D.  Electronically Signed     DCL/MEDQ  D:  02/27/2005  T:  02/27/2005  Job:  [id number]

## 2010-11-03 NOTE — H&P (Signed)
Molly Peterson, Molly Peterson             ACCOUNT NO.:  [id number]   MEDICAL RECORD NO.:  [id number]          PATIENT TYPE:  AMB   LOCATION:  SDC                           FACILITY:  [facility]   PHYSICIAN:  Dineen Kid. Rana Snare, M.D.    DATE OF BIRTH:  1965-08-14   DATE OF ADMISSION:  02/27/2005  DATE OF DISCHARGE:                                HISTORY & PHYSICAL   HISTORY OF PRESENT ILLNESS:  Molly Peterson is a 45 year old, G2, P2, with  worsening problems with menometrorrhagia, dysmenorrhea, and pelvic pain, and  with a predilection towards the left adnexa.  She has had a history of  recurrent left ovarian cyst.  She has tried multiple conservative medical  management therapies including oral contraceptive agents, has been offered  NovaSure endometrial ablation, but because of continued pain and bleeding,  desires definitive surgical intervention, and plans hysterectomy with  possible removal of the left tube and ovary.   PAST MEDICAL HISTORY:  Negative.   PAST SURGICAL HISTORY:  1.  Appendectomy in 1993.  2.  Tonsillectomy in 1990.  3.  Two cesarean sections in 1997 and 1998.  Her husband has had a      vasectomy.   CURRENT MEDICATIONS:  Advil.   ALLERGIES:  No known drug allergies.   PHYSICAL EXAMINATION:  VITAL SIGNS:  Blood pressure is 124/86.  HEART:  Regular rate and rhythm.  LUNGS:  Clear to auscultation bilaterally.  ABDOMEN:  Nondistended, nontender.  PELVIC:  The uterus is anteverted, mobile, nontender.  No adnexal masses are  palpable.  Ultrasound evaluation from July of 2006 shows a 7.6 x 4.1 x 5.2  cm uterus.  The left ovary has a small simple cyst with some septations  measuring 2.5 cm in size.   IMPRESSION AND PLAN:  Menometrorrhagia, pelvic pain, history of recurrent  left ovarian cyst, not responsive to conservative medical management  including oral contraceptive agents, anti-inflammatory medications, or  narcotic medications.  After discussing different options at length,  the  patient desires laparoscopically-assisted vaginal hysterectomy with left  ovarian cystectomy, possible left salpingo-oophorectomy.  She does want  preservation of the right ovary.  We discussed the risks and benefits of at  length which include, but are not limited to, risk of infection, bleeding,  damage to the ureters, ovaries, bowel, bladder, the possibility this may not  alleviate the pain, the  possibility that it could recur, the possibility of the cyst recurring,  risks associated with anesthesia, risks associated with blood loss, the  possibility that this may not be able to be performed vaginally and may  require laparotomy.  She does give her informed consent and wishes to  proceed.      Dineen Kid Rana Snare, M.D.  Electronically Signed     DCL/MEDQ  D:  02/26/2005  T:  02/26/2005  Job:  [id number]

## 2010-11-03 NOTE — Discharge Summary (Signed)
[name], Peterson             ACCOUNT NO.:  [id number]   MEDICAL RECORD NO.:  [id number]          PATIENT TYPE:  INP   LOCATION:  9318                          FACILITY:  [facility]   PHYSICIAN:  Dineen Kid. Rana Snare, M.D.    DATE OF BIRTH:  1965-08-11   DATE OF ADMISSION:  02/27/2005  DATE OF DISCHARGE:  02/28/2005                                 DISCHARGE SUMMARY   HISTORY OF PRESENT ILLNESS:  Molly Peterson is a 45 year old G2, P2 with  worsening menometrorrhagia, dysmenorrhea, pelvic pain with a predilection  towards the left adnexa.  She also has a history of a left ovarian cyst.  Tried multiple conservative medical managements without success.  Desires  definitive surgical intervention.  Plans laparoscopic-assisted vaginal  hysterectomy, possible removal of left tube and ovary versus left ovarian  cystectomy versus leaving the left tube and ovary.   HOSPITAL COURSE:  Patient was admitted same day of surgery.  She underwent  an LAVH which was uncomplicated.  The left tube and ovary were perfectly  normal in appearance as was the right tube and ovary.  The surgery was  uncomplicated.  The estimated blood loss was 300 mL.  Patient's  postoperative course was unremarkable.  Postoperative hemoglobin 11.6, white  count 7.9.  On postoperative day #1 she tolerated regular diet, ambulated  without difficulty.  Bowel sounds were normoactive.  Incisions were clean,  dry, and intact.  Patient was discharged home.  Will follow up in the office  in two weeks.   DISPOSITION:  Patient will be discharged home.  Follow up in the office two  weeks.  Sent home a prescription for Tylox #30.  Told to return for  increased pain, fever, or bleeding.      Dineen Kid Rana Snare, M.D.  Electronically Signed     DCL/MEDQ  D:  02/28/2005  T:  02/28/2005  Job:  [id number]
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
## 2010-11-10 ENCOUNTER — Ambulatory Visit: Payer: Self-pay | Admitting: Cardiology

## 2010-11-14 ENCOUNTER — Encounter: Payer: Self-pay | Admitting: Cardiology

## 2010-11-24 ENCOUNTER — Encounter: Payer: Self-pay | Admitting: Cardiology

## 2010-11-24 ENCOUNTER — Ambulatory Visit (INDEPENDENT_AMBULATORY_CARE_PROVIDER_SITE_OTHER): Payer: PRIVATE HEALTH INSURANCE | Admitting: Cardiology

## 2010-11-24 DIAGNOSIS — R609 Edema, unspecified: Secondary | ICD-10-CM

## 2010-11-24 DIAGNOSIS — R002 Palpitations: Secondary | ICD-10-CM

## 2010-11-24 DIAGNOSIS — I10 Essential (primary) hypertension: Secondary | ICD-10-CM

## 2010-11-24 NOTE — Progress Notes (Signed)
HPI Molly Peterson in today for Evaluation and management of her hypertension and lower extremity edema. She has no complaints today. Her blood pressures under good control. She's had no edema.  Electrocardiogram is normal. Past Medical History  Diagnosis Date  . ANXIETY 09/18/2006  . REACTION, ACUTE STRESS W/EMOTIONAL DSTURB 12/23/2006  . DEPRESSION 09/18/2006  . HYPERTENSION 09/18/2006  . DERMATITIS, SEBORRHEIC NEC 01/06/2007  . Palpitations 09/18/2006  . Edema     symptom  . Acute pharyngitis   . Urinary tract infection, site not specified   . Flatulence, eructation, and gas pain   . Unspecified constipation     Past Surgical History  Procedure Date  . Appendectomy unknown  . Abdominal hysterectomy     partial  . Tonsillectomy unknown  . Caesarean     x2    Family History  Problem Relation Age of Onset  . Hypertension Mother   . Heart disease Mother     PVC @age  55  . Heart disease Father     MI @ags  32 , CABG @55   . Hypertension Father     History   Social History  . Marital Status: Married    Spouse Name: N/A    Number of Children: 2  . Years of Education: N/A   Occupational History  .     Social History Main Topics  . Smoking status: Former Smoker    Quit date: 06/18/2002  . Smokeless tobacco: Former Neurosurgeon  . Alcohol Use: Not on file  . Drug Use: Not on file  . Sexually Active: Not on file   Other Topics Concern  . Not on file   Social History Narrative  . No narrative on file    No Known Allergies  Current Outpatient Prescriptions  Medication Sig Dispense Refill  . ALPRAZolam (XANAX) 0.25 MG tablet Take 1 tablet (0.25 mg total) by mouth every 8 (eight) hours as needed.  30 tablet  1  . lisinopril (PRINIVIL,ZESTRIL) 10 MG tablet Take 1 tablet (10 mg total) by mouth daily.  90 tablet  3  . spironolactone (ALDACTONE) 25 MG tablet Take 1 tablet (25 mg total) by mouth daily.  90 tablet  3    ROS Negative other than HPI.   PE General Appearance:  well developed, well nourished in no acute distress HEENT: symmetrical face, PERRLA, good dentition  Neck: no JVD, thyromegaly, or adenopathy, trachea midline Chest: symmetric without deformity Cardiac: PMI non-displaced, RRR, normal S1, S2, no gallop or murmur Lung: clear to ausculation and percussion Vascular: all pulses full without bruits  Abdominal: nondistended, nontender, good bowel sounds, no HSM, no bruits Extremities: no cyanosis, clubbing or edema, no sign of DVT, no varicosities  Skin: normal color, no rashes Neuro: alert and oriented x 3, non-focal Pysch: normal affect Filed Vitals:   11/24/10 0935  BP: 106/62  Pulse: 75  Height: 5\' 3"  (1.6 m)  Weight: 131 lb (59.421 kg)    EKG  Labs and Studies Reviewed.   Lab Results  Component Value Date   WBC 7.5 10/11/2008   HGB 14.2 10/11/2008   HCT 41.7 10/11/2008   MCV 89.0 10/11/2008   PLT 182.0 10/11/2008      Chemistry      Component Value Date/Time   NA 141 08/16/2009 1020   K 3.9 08/16/2009 1020   CL 105 08/16/2009 1020   CO2 33* 08/16/2009 1020   BUN 16 08/16/2009 1020   CREATININE 0.8 08/16/2009 1020  Component Value Date/Time   CALCIUM 9.6 08/16/2009 1020   ALKPHOS 45 05/10/2008 0903   AST 20 05/10/2008 0903   ALT 18 05/10/2008 0903   BILITOT 0.9 05/10/2008 0903       Lab Results  Component Value Date   CHOL 202* 05/10/2008   Lab Results  Component Value Date   HDL 43.1 05/10/2008   No results found for this basename: [facility]   Lab Results  Component Value Date   TRIG 84 05/10/2008   Lab Results  Component Value Date   CHOLHDL 4.7 CALC 05/10/2008   No results found for this basename: HGBA1C   Lab Results  Component Value Date   ALT 18 05/10/2008   AST 20 05/10/2008   ALKPHOS 45 05/10/2008   BILITOT 0.9 05/10/2008   Lab Results  Component Value Date   TSH 1.67 10/11/2008

## 2010-11-24 NOTE — Patient Instructions (Signed)
Your physician recommends that you schedule a follow-up appointment in: 1 year with Dr. Wall  

## 2010-11-24 NOTE — Assessment & Plan Note (Signed)
Improved

## 2010-11-24 NOTE — Assessment & Plan Note (Signed)
Good control. No change in therapy.

## 2011-01-31 ENCOUNTER — Other Ambulatory Visit: Payer: Self-pay

## 2011-01-31 MED ORDER — ALPRAZOLAM 0.25 MG PO TABS: 0.2500 mg | ORAL_TABLET | Freq: Three times a day (TID) | ORAL | Status: DC | PRN

## 2011-01-31 NOTE — Telephone Encounter (Signed)
[facility] to [facility]

## 2011-03-27 ENCOUNTER — Telehealth: Payer: Self-pay | Admitting: *Deleted

## 2011-03-27 MED ORDER — ALPRAZOLAM 0.25 MG PO TABS: 0.2500 mg | ORAL_TABLET | Freq: Three times a day (TID) | ORAL | Status: DC | PRN

## 2011-03-27 NOTE — Telephone Encounter (Signed)
[facility]

## 2011-03-27 NOTE — Telephone Encounter (Signed)
Ok #60 

## 2011-03-27 NOTE — Telephone Encounter (Signed)
Pt got Alprazolam .25 mg. # 30 instead of #60 the last refill.  She is going out of town, and is really nervous staying alone in the hotel room.  Would like a refill called to [facility] ([place]). She has about 12 left in her bottle from last refill.

## 2011-09-14 ENCOUNTER — Other Ambulatory Visit: Payer: Self-pay | Admitting: Internal Medicine

## 2011-09-21 ENCOUNTER — Other Ambulatory Visit: Payer: Self-pay | Admitting: Internal Medicine

## 2011-09-26 ENCOUNTER — Encounter: Payer: Self-pay | Admitting: Internal Medicine

## 2011-09-26 ENCOUNTER — Ambulatory Visit (INDEPENDENT_AMBULATORY_CARE_PROVIDER_SITE_OTHER): Payer: BC Managed Care – PPO | Admitting: Internal Medicine

## 2011-09-26 VITALS — BP 90/60 | Temp 98.1°F | Wt 131.0 lb

## 2011-09-26 DIAGNOSIS — M25562 Pain in left knee: Secondary | ICD-10-CM

## 2011-09-26 DIAGNOSIS — I10 Essential (primary) hypertension: Secondary | ICD-10-CM

## 2011-09-26 DIAGNOSIS — R609 Edema, unspecified: Secondary | ICD-10-CM

## 2011-09-26 LAB — LIPID PANEL
HDL: 44.1 mg/dL (ref >39.00)
Triglycerides: 81 mg/dL (ref 0.0–149.0)

## 2011-09-26 LAB — COMPREHENSIVE METABOLIC PANEL
Alkaline Phosphatase: 58 U/L (ref 39–117)
BUN: 19 mg/dL (ref 6–23)
Glucose, Bld: 89 mg/dL (ref 70–99)
Sodium: 139 mEq/L (ref 135–145)
Total Bilirubin: 0.4 mg/dL (ref 0.3–1.2)
Total Protein: 7.3 g/dL (ref 6.0–8.3)

## 2011-09-26 LAB — CBC WITH DIFFERENTIAL/PLATELET
Basophils Relative: 0.7 % (ref 0.0–3.0)
Eosinophils Relative: 4.3 % (ref 0.0–5.0)
HCT: 40.1 % (ref 36.0–46.0)
Hemoglobin: 13.6 g/dL (ref 12.0–15.0)
Lymphs Abs: 2.2 10*3/uL (ref 0.7–4.0)
Monocytes Relative: 7.1 % (ref 3.0–12.0)
Neutro Abs: 2.3 10*3/uL (ref 1.4–7.7)
RBC: 4.59 Mil/uL (ref 3.87–5.11)
WBC: 5.2 10*3/uL (ref 4.5–10.5)

## 2011-09-26 LAB — SEDIMENTATION RATE: Sed Rate: 19 mm/hr (ref 0–22)

## 2011-09-26 MED ORDER — SPIRONOLACTONE 25 MG PO TABS: 25.0000 mg | ORAL_TABLET | Freq: Every day | ORAL | Status: DC

## 2011-09-26 MED ORDER — ALPRAZOLAM 0.25 MG PO TABS: 0.2500 mg | ORAL_TABLET | Freq: Three times a day (TID) | ORAL | Status: DC | PRN

## 2011-09-26 NOTE — Patient Instructions (Signed)
Limit your sodium (Salt) intake    It is important that you exercise regularly, at least 20 minutes 3 to 4 times per week.  If you develop chest pain or shortness of breath seek  medical attention.  Please check your blood pressure on a regular basis.  If it is consistently greater than 150/90, please make an office appointment.  Rheumatology evaluation as discussed  Call or return to clinic prn if these symptoms worsen or fail to improve as anticipated.

## 2011-09-26 NOTE — Progress Notes (Signed)
  Subjective:    Patient ID: Molly Peterson, female    DOB: [birth year], 45 y.o.   MRN: [id number]  HPI  46 year old patient who is seen today for followup. She is seen at this office and frequently and is also followed by cardiology due to hypertension fluid retention palpitations and a family history of heart disease. Blood pressure today is in the low normal range. She continues to complain of edema mainly involving the hands. She does adhere to a low salt diet.  Complaints include generalized pain and stiffness that is worse in the morning;  she describes a general sense of unwellness and at times seems quite forgetful. Symptoms seemed to improve throughout the day and do at times unresponsive anti-inflammatory medication she does have a history of anxiety and depression. A son has chronic fatigue syndrome    Review of Systems  Constitutional: Positive for fatigue.  HENT: Negative for hearing loss, congestion, sore throat, rhinorrhea, dental problem, sinus pressure and tinnitus.   Eyes: Negative for pain, discharge and visual disturbance.  Respiratory: Negative for cough and shortness of breath.   Cardiovascular: Negative for chest pain, palpitations and leg swelling.  Gastrointestinal: Negative for nausea, vomiting, abdominal pain, diarrhea, constipation, blood in stool and abdominal distention.  Genitourinary: Negative for dysuria, urgency, frequency, hematuria, flank pain, vaginal bleeding, vaginal discharge, difficulty urinating, vaginal pain and pelvic pain.  Musculoskeletal: Positive for myalgias, joint swelling and arthralgias. Negative for gait problem.  Skin: Negative for rash.  Neurological: Positive for weakness. Negative for dizziness, syncope, speech difficulty, numbness and headaches.  Hematological: Negative for adenopathy.  Psychiatric/Behavioral: Negative for behavioral problems, dysphoric mood and agitation. The patient is not nervous/anxious.        Objective:   Physical Exam  Constitutional: She is oriented to person, place, and time. She appears well-developed and well-nourished.  HENT:  Head: Normocephalic.  Right Ear: External ear normal.  Left Ear: External ear normal.  Mouth/Throat: Oropharynx is clear and moist.  Eyes: Conjunctivae and EOM are normal. Pupils are equal, round, and reactive to light.  Neck: Normal range of motion. Neck supple. No thyromegaly present.  Cardiovascular: Normal rate, regular rhythm, normal heart sounds and intact distal pulses.   Pulmonary/Chest: Effort normal and breath sounds normal.  Abdominal: Soft. Bowel sounds are normal. She exhibits no mass. There is no tenderness.  Musculoskeletal: Normal range of motion. She exhibits no edema.       The fingers seem slightly puffy but no frank arthritis. Her rings fit snugly  Lymphadenopathy:    She has no cervical adenopathy.  Neurological: She is alert and oriented to person, place, and time.  Skin: Skin is warm and dry. No rash noted.  Psychiatric: She has a normal mood and affect. Her behavior is normal.          Assessment & Plan:   Hypertension well controlled. We'll discontinue lisinopril and continue diuretic therapy only she is concerned about possible side effects related to the lisinopril and blood pressure is in a low normal range. History of fluid retention. Patient has a long history of generalized stiffness pain and general sense of unwellness. I wonder about a fibromyalgia type syndrome. We'll set up for rheumatologic evaluation and check screening labs and a sedimentation rate today

## 2011-10-05 ENCOUNTER — Telehealth: Payer: Self-pay | Admitting: Internal Medicine

## 2011-10-05 NOTE — Telephone Encounter (Signed)
Spoke with pt - informed labs stable and WNL

## 2011-10-05 NOTE — Telephone Encounter (Signed)
Pt is requesting bloodwork results 

## 2011-11-01 ENCOUNTER — Other Ambulatory Visit: Payer: Self-pay | Admitting: Obstetrics and Gynecology

## 2011-11-01 DIAGNOSIS — Z1231 Encounter for screening mammogram for malignant neoplasm of breast: Secondary | ICD-10-CM

## 2011-11-09 ENCOUNTER — Telehealth: Payer: Self-pay | Admitting: Internal Medicine

## 2011-11-09 DIAGNOSIS — M2550 Pain in unspecified joint: Secondary | ICD-10-CM

## 2011-11-09 NOTE — Telephone Encounter (Signed)
Addended by: Duard Brady I on: 11/09/2011 01:24 PM   Modules accepted: Orders

## 2011-11-09 NOTE — Telephone Encounter (Signed)
Rheumatology referral please do to arthralgias

## 2011-11-09 NOTE — Telephone Encounter (Signed)
Patient called stating that she was suppose to a rheumatologist but her labs came back negative and now she has developed more symptoms plus the on-going symptoms and she would like to know what the next step is as she has not heard anything back in a while. Please advise.

## 2011-11-09 NOTE — Telephone Encounter (Signed)
Will make new referral - , pt also indicated BP running high norm - she restarted her BP med.will monitor and to see next week when back in town if still not feeling well

## 2011-11-09 NOTE — Telephone Encounter (Signed)
Pt called and cx appt with rheumatology because "her labs were normal"  Would you like for me to re-order referral to rheumatology?

## 2011-11-16 ENCOUNTER — Emergency Department (HOSPITAL_COMMUNITY)
Admission: EM | Admit: 2011-11-16 | Discharge: 2011-11-16 | Disposition: A | Discharge status: Home / Self Care | Payer: BC Managed Care – PPO | Attending: Emergency Medicine | Admitting: Emergency Medicine

## 2011-11-16 ENCOUNTER — Ambulatory Visit
Admission: RE | Admit: 2011-11-16 | Discharge: 2011-11-16 | Disposition: A | Discharge status: Home / Self Care | Payer: BC Managed Care – PPO | Source: Ambulatory Visit | Attending: Obstetrics and Gynecology | Admitting: Obstetrics and Gynecology

## 2011-11-16 ENCOUNTER — Encounter (HOSPITAL_COMMUNITY): Payer: Self-pay | Admitting: Emergency Medicine

## 2011-11-16 DIAGNOSIS — Z23 Encounter for immunization: Secondary | ICD-10-CM | POA: Insufficient documentation

## 2011-11-16 DIAGNOSIS — I10 Essential (primary) hypertension: Secondary | ICD-10-CM | POA: Insufficient documentation

## 2011-11-16 DIAGNOSIS — Z1231 Encounter for screening mammogram for malignant neoplasm of breast: Secondary | ICD-10-CM

## 2011-11-16 DIAGNOSIS — Z203 Contact with and (suspected) exposure to rabies: Secondary | ICD-10-CM | POA: Insufficient documentation

## 2011-11-16 DIAGNOSIS — F341 Dysthymic disorder: Secondary | ICD-10-CM | POA: Insufficient documentation

## 2011-11-16 MED ORDER — RABIES VACCINE, PCEC IM SUSR
1.0000 mL | Freq: Once | INTRAMUSCULAR | Status: AC
  Administered 2011-11-16: 1 mL via INTRAMUSCULAR
  Filled 2011-11-16: qty 1

## 2011-11-16 MED ORDER — RABIES IMMUNE GLOBULIN 150 UNIT/ML IM INJ
20.0000 [IU]/kg | INJECTION | Freq: Once | INTRAMUSCULAR | Status: AC
  Administered 2011-11-16: 1200 [IU]
  Filled 2011-11-16: qty 8

## 2011-11-16 NOTE — Discharge Instructions (Signed)
Rabies   You may have been exposed to rabies. It can be carried by skunks, bats, woodchucks, raccoons and foxes. It is less common in dogs; however this is one of the most common bites for which the rabies vaccine is given. When bitten by an infected animal, a person gets rabies from the infected saliva (spit) of the animal. Most people get sick 20 to 90 days after being bitten. This varies based on the location of the bite. The rabies virus affects the brain so the closer to the head the bite occurs, the less time it will take the illness to develop. Once rabies develops it almost always causes death. Because of this, it is often necessary to start treatment whether it is known if the animal is healthy or not.  If bitten by an animal and the animal can be observed to see if it remains healthy, often no further treatment is necessary other than care of the wounds caused by the animal. If the animal has been killed it can be sent into a state laboratory and the brain can be examined to see if the animal had rabies.  TREATMENT   There is no known treatment for rabies once the disease starts. This is a viral illness and antibiotics (medications which kill germs) do not help. This is why caregivers use extra caution and treat questionable bites with rabies vaccine to prevent the disease.  RABIES IMMUNIZATION SCHEDULE - POST-EXPOSURE  Be sure to record the dates of your injections for your records.  1st Injection Date________________________  Day 3__________________________________  Day 7__________________________________  Day 14_________________________________  HOME CARE INSTRUCTIONS   If you have received a bite from an unknown animal, make sure you know the instructions for follow up. If the animal was sent to a laboratory for examination, make sure you know how you are to obtain your results.   Keep wound clean, dry and dressed as suggested by your caregiver.   Take antibiotics as directed and finish the  prescription, even if the wound appears OK.   Keep injured part elevated as much as possible.   Do not resume use of the affected area until directed.   Only take over-the-counter or prescription medicines for pain, discomfort, or fever as directed by your caregiver.  SEEK IMMEDIATE MEDICAL CARE IF:    You have a fever.   There is nausea (feeling sick to your stomach), vomiting, chills or a high temperature.   There is unusual behavior including hyperactivity, fear of water (hydrophobia), or fear of air (aerophobia).   If pain prevents movement of any joint.   If you are unable to move a finger or toe.   The wound becomes more inflamed and swollen, or has a purulent (pus-like) discharge.   You notice that there is a foreign substance, such as cloth or a tooth, in the wound.   If a red line develops at the site of the wound and begins to move up the arm or leg.  Document Released: 06/04/2005 Document Revised: 05/24/2011 Document Reviewed: 09/09/2008  [employer] Patient Information 2012 [employer]
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
## 2011-11-16 NOTE — ED Provider Notes (Signed)
History     CSN: [id number]  Arrival date & time 11/16/11  1531   First MD Initiated Contact with Patient 11/16/11 1600      Chief Complaint  Patient presents with  . Rabies Injection    (Consider location/radiation/quality/duration/timing/severity/associated sxs/prior treatment) HPI Comments: Patient here with husband and sons - she states that on Thursday evening she and her husband awoke to their dogs barking and noted a black object flying in the room, when they turned on the lights it was noted to be a bat.  They have called animal control who has looked through the home and did not find a bat.  They called their PCP and CDC and it was recommended they get the rabies vaccine and immunuglobin.   Past Medical History  Diagnosis Date  . ANXIETY 09/18/2006  . REACTION, ACUTE STRESS W/EMOTIONAL DSTURB 12/23/2006  . DEPRESSION 09/18/2006  . HYPERTENSION 09/18/2006  . DERMATITIS, SEBORRHEIC NEC 01/06/2007  . Palpitations 09/18/2006  . Edema     symptom  . Acute pharyngitis   . Urinary tract infection, site not specified   . Flatulence, eructation, and gas pain   . Unspecified constipation     Past Surgical History  Procedure Date  . Appendectomy unknown  . Abdominal hysterectomy     partial  . Tonsillectomy unknown  . Caesarean     x2    Family History  Problem Relation Age of Onset  . Hypertension Mother   . Heart disease Mother     PVC @age  55  . Heart disease Father     MI @ags  5 , CABG @55   . Hypertension Father     History  Substance Use Topics  . Smoking status: Former Smoker    Quit date: 06/18/2002  . Smokeless tobacco: Never Used  . Alcohol Use: Yes     occasionally    OB History    Grav Para Term Preterm Abortions TAB SAB Ect Mult Living                  Review of Systems  Constitutional: Negative for fever and chills.  HENT: Negative for neck pain and neck stiffness.   Eyes: Negative for pain.  Respiratory: Negative for chest tightness and  shortness of breath.   Cardiovascular: Negative for chest pain.  Gastrointestinal: Negative for nausea, vomiting and abdominal pain.  Genitourinary: Negative for dysuria.  Musculoskeletal: Negative for arthralgias.  Skin: Negative for rash and wound.  Neurological: Negative for seizures, speech difficulty and headaches.  All other systems reviewed and are negative.    Allergies  Review of patient's allergies indicates no known allergies.  Home Medications   Current Outpatient Rx  Name Route Sig Dispense Refill  . LISINOPRIL 20 MG PO TABS Oral Take 20 mg by mouth daily.    Marland Kitchen SPIRONOLACTONE 25 MG PO TABS Oral Take 25 mg by mouth daily.      BP 143/103  Pulse 97  Temp(Src) 97.9 F (36.6 C) (Oral)  Resp 20  Wt 133 lb 1 oz (60.357 kg)  SpO2 99%  Physical Exam  Nursing note and vitals reviewed. Constitutional: She is oriented to person, place, and time. She appears well-developed and well-nourished. No distress.  HENT:  Head: Normocephalic and atraumatic.  Right Ear: External ear normal.  Left Ear: External ear normal.  Nose: Nose normal.  Mouth/Throat: Oropharynx is clear and moist. No oropharyngeal exudate.  Eyes: Conjunctivae are normal. Pupils are equal, round, and reactive to light.  No scleral icterus.  Neck: Normal range of motion. Neck supple.  Cardiovascular: Normal rate, regular rhythm and normal heart sounds.  Exam reveals no gallop and no friction rub.   No murmur heard. Pulmonary/Chest: Breath sounds normal. No respiratory distress. She has no wheezes. She has no rales. She exhibits no tenderness.  Abdominal: Soft. Bowel sounds are normal. She exhibits no distension. There is no tenderness.  Musculoskeletal: Normal range of motion. She exhibits no edema and no tenderness.  Lymphadenopathy:    She has no cervical adenopathy.  Neurological: She is alert and oriented to person, place, and time. No cranial nerve deficit. She exhibits normal muscle tone. Coordination  normal.  Skin: Skin is warm and dry. No rash noted. No erythema. No pallor.  Psychiatric: She has a normal mood and affect. Her behavior is normal. Judgment and thought content normal.    ED Course  Procedures (including critical care time)  Labs Reviewed - No data to display No results found.   Rabies exposure    MDM  Patient here with exposure to bat in home.  Though they did not find the bat, it has been discussed with the family and they have opted to get the rabies vaccine and IG injections.  They will follow up at [facility] for remainder of the course.        Izola Price [place], [place] 11/16/11 [phone]

## 2011-11-17 NOTE — ED Provider Notes (Signed)
Medical screening examination/treatment/procedure(s) were performed by non-physician practitioner and as supervising physician I was immediately available for consultation/collaboration.  Arley Phenix, MD 11/17/11 [phone]

## 2011-11-19 ENCOUNTER — Emergency Department (INDEPENDENT_AMBULATORY_CARE_PROVIDER_SITE_OTHER)
Admission: EM | Admit: 2011-11-19 | Discharge: 2011-11-19 | Disposition: A | Discharge status: Home / Self Care | Payer: BC Managed Care – PPO | Source: Home / Self Care

## 2011-11-19 ENCOUNTER — Encounter (HOSPITAL_COMMUNITY): Payer: Self-pay | Admitting: *Deleted

## 2011-11-19 DIAGNOSIS — Z23 Encounter for immunization: Secondary | ICD-10-CM

## 2011-11-19 MED ORDER — RABIES VACCINE, PCEC IM SUSR
1.0000 mL | Freq: Once | INTRAMUSCULAR | Status: AC
  Administered 2011-11-19: 1 mL via INTRAMUSCULAR

## 2011-11-19 MED ORDER — RABIES VACCINE, PCEC IM SUSR
INTRAMUSCULAR | Status: AC
  Filled 2011-11-19: qty 1

## 2011-11-19 NOTE — ED Notes (Signed)
Rabies vaccine schedule faxed to urgent care and pharmacy

## 2011-11-19 NOTE — ED Notes (Signed)
Pt. here for 2nd rabies vaccine for bat exposure.

## 2011-11-19 NOTE — Discharge Instructions (Signed)
Call if any problems.  Return 6/7 for next vaccine.

## 2011-11-23 ENCOUNTER — Encounter (HOSPITAL_COMMUNITY): Payer: Self-pay | Admitting: *Deleted

## 2011-11-23 ENCOUNTER — Emergency Department (INDEPENDENT_AMBULATORY_CARE_PROVIDER_SITE_OTHER)
Admission: EM | Admit: 2011-11-23 | Discharge: 2011-11-23 | Disposition: A | Discharge status: Home / Self Care | Payer: BC Managed Care – PPO | Source: Home / Self Care

## 2011-11-23 DIAGNOSIS — Z23 Encounter for immunization: Secondary | ICD-10-CM

## 2011-11-23 MED ORDER — RABIES VACCINE, PCEC IM SUSR
1.0000 mL | Freq: Once | INTRAMUSCULAR | Status: AC
  Administered 2011-11-23: 1 mL via INTRAMUSCULAR

## 2011-11-23 MED ORDER — RABIES VACCINE, PCEC IM SUSR
INTRAMUSCULAR | Status: AC
  Filled 2011-11-23: qty 1

## 2011-11-23 NOTE — Discharge Instructions (Signed)
Call if any problems.  Return 6/14 @ 1600 for last vaccine. 

## 2011-11-23 NOTE — ED Notes (Signed)
Here for third rabies vaccine for bat exposure. No problems from previous injections.

## 2011-11-29 ENCOUNTER — Telehealth: Payer: Self-pay | Admitting: Internal Medicine

## 2011-11-29 MED ORDER — HYDROCHLOROTHIAZIDE 25 MG PO TABS: 25.0000 mg | ORAL_TABLET | Freq: Every day | ORAL | Status: DC

## 2011-11-29 NOTE — Telephone Encounter (Signed)
Pt went to specialist this morning. Specialist told pt that her spironolactone (ALDACTONE) 25 MG tablet  needed to be changed, because it doesn't work as well as HCTZ. Pls call in HCTZ to [facility] at [facility].

## 2011-11-29 NOTE — Telephone Encounter (Signed)
hctz  25 mg  #90  One daily

## 2011-11-29 NOTE — Telephone Encounter (Signed)
Attempt to call - hm# will not connect - tried cell# - picked up then hung up on . Will sent new medications in

## 2011-11-29 NOTE — Telephone Encounter (Signed)
Please advise 

## 2011-11-30 ENCOUNTER — Encounter (HOSPITAL_COMMUNITY): Payer: Self-pay | Admitting: *Deleted

## 2011-11-30 ENCOUNTER — Emergency Department (INDEPENDENT_AMBULATORY_CARE_PROVIDER_SITE_OTHER)
Admission: EM | Admit: 2011-11-30 | Discharge: 2011-11-30 | Disposition: A | Discharge status: Home / Self Care | Payer: BC Managed Care – PPO | Source: Home / Self Care

## 2011-11-30 DIAGNOSIS — Z23 Encounter for immunization: Secondary | ICD-10-CM

## 2011-11-30 MED ORDER — RABIES VACCINE, PCEC IM SUSR
INTRAMUSCULAR | Status: AC
  Filled 2011-11-30: qty 1

## 2011-11-30 MED ORDER — RABIES VACCINE, PCEC IM SUSR
1.0000 mL | Freq: Once | INTRAMUSCULAR | Status: AC
  Administered 2011-11-30: 1 mL via INTRAMUSCULAR

## 2011-11-30 NOTE — Discharge Instructions (Signed)
Congratulations, you have finished your rabies series.  Call if any problems. If any further rabies exposures, go to the ED and get a rabies titer drawn. If low, you would need a booster shot.

## 2011-11-30 NOTE — ED Notes (Signed)
Here for last rabies vaccine for bat exposure. No previous problems with injections. 

## 2011-12-14 ENCOUNTER — Other Ambulatory Visit: Payer: Self-pay | Admitting: Internal Medicine

## 2011-12-14 NOTE — Telephone Encounter (Signed)
Lisinopril 10 mg #90

## 2011-12-14 NOTE — Telephone Encounter (Signed)
Caller: [name]/Patient is calling with a question about Lasinopril 10MG .The medication was written by Eleonore Chiquito. States has seen MD in past for blood pressure issues in April 2013. Was told to stop taking Lisinopril 10mg . Has checked blood pressure intermittently and was 130's systolic. Began taking Lisinopril 1/2 tab and pressure is now in 120's systolic. Diastolic is in 80's when taking 1/2 tab. Has appointment 7-3 with cardiologist/Dr Margo Aye but does not have enough medicine. Has also been taking HCTZ 25mg  daily as directed and "has helped tremendously". Took last 1/2tab of Lisinopril 6-28. Heart rate was in 80's prior to starting Lisinopril again but hasnot checked since starting medicine.  States does not want to be without medicine until sees cardiologist 7-3. Uses [facility]. PLEASE CALL PT AND ADVISE IF CAN REFILL LISINOPRIL 10MG  TABS.

## 2011-12-19 ENCOUNTER — Encounter: Payer: Self-pay | Admitting: Cardiology

## 2011-12-19 ENCOUNTER — Ambulatory Visit (INDEPENDENT_AMBULATORY_CARE_PROVIDER_SITE_OTHER): Payer: BC Managed Care – PPO | Admitting: Cardiology

## 2011-12-19 VITALS — BP 110/70 | HR 89 | Ht 63.0 in | Wt 130.0 lb

## 2011-12-19 DIAGNOSIS — E785 Hyperlipidemia, unspecified: Secondary | ICD-10-CM | POA: Insufficient documentation

## 2011-12-19 DIAGNOSIS — I10 Essential (primary) hypertension: Secondary | ICD-10-CM

## 2011-12-19 NOTE — Patient Instructions (Addendum)
Your physician recommends that you return for fasting lab work: lipid level  Your physician recommends that you continue on your current medications as directed. Please refer to the Current Medication list given to you today.  Your physician wants you to follow-up in: 1 year. You will receive a reminder letter in the mail two months in advance. If you don't receive a letter, please call our office to schedule the follow-up appointment.

## 2011-12-19 NOTE — Assessment & Plan Note (Addendum)
Improved. She does have  sometimes at night.

## 2011-12-19 NOTE — Progress Notes (Signed)
HPI Molly Peterson returns today for evaluation and management of her hypertension, palpitations, family history of coronary disease.  Her father did have coronary disease in his mid-50s but was a 3 pack-a-day smoker. She does not smoke.  She does not get exercise much. Her weight is stable.  Her last lipids were remarkably increased from once 3 years ago. Her total cholesterol was 241, triglycerides were normal at 81, HDL is 44, and LDL was up 409. Respiratory work was normal including her TSH. She  denies any dyspnea on exertion, chest pain, orthopnea, or PND. She is very few palpitations. She does get some swelling in her hands during the day. This has been evaluated by her primary care.  Past Medical History  Diagnosis Date  . ANXIETY 09/18/2006  . REACTION, ACUTE STRESS W/EMOTIONAL DSTURB 12/23/2006  . DEPRESSION 09/18/2006  . HYPERTENSION 09/18/2006  . DERMATITIS, SEBORRHEIC NEC 01/06/2007  . Palpitations 09/18/2006  . Edema     symptom  . Acute pharyngitis   . Urinary tract infection, site not specified   . Flatulence, eructation, and gas pain   . Unspecified constipation     Current Outpatient Prescriptions  Medication Sig Dispense Refill  . hydrochlorothiazide (HYDRODIURIL) 25 MG tablet Take 1 tablet (25 mg total) by mouth daily.  90 tablet  0  . lisinopril (PRINIVIL,ZESTRIL) 10 MG tablet TAKE ONE TABLET BY MOUTH ONE TIME DAILY  90 tablet  2    No Known Allergies  Family History  Problem Relation Age of Onset  . Heart disease Mother     PVC @age  55  . Heart disease Father     MI @ags  90 , CABG @55   . Hypertension Father     History   Social History  . Marital Status: Married    Spouse Name: N/A    Number of Children: 2  . Years of Education: N/A   Occupational History  .     Social History Main Topics  . Smoking status: Former Smoker    Quit date: 06/18/2002  . Smokeless tobacco: Never Used  . Alcohol Use: Yes     occasionally  . Drug Use: No  . Sexually Active:  Not on file   Other Topics Concern  . Not on file   Social History Narrative  . No narrative on file    ROS ALL NEGATIVE EXCEPT THOSE NOTED IN HPI  PE  General Appearance: well developed, well nourished in no acute distress HEENT: symmetrical face, PERRLA, good dentition  Neck: no JVD, thyromegaly, or adenopathy, trachea midline Chest: symmetric without deformity Cardiac: PMI non-displaced, RRR, normal S1, S2, no gallop or murmur Lung: clear to ausculation and percussion Vascular: all pulses full without bruits  Abdominal: nondistended, nontender, good bowel sounds, no HSM, no bruits Extremities: no cyanosis, clubbing or edema, no sign of DVT, no varicosities  Skin: normal color, no rashes Neuro: alert and oriented x 3, non-focal Pysch: normal affect  EKG Normal sinus rhythm with a suggestion of biatrial enlargement BMET    Component Value Date/Time   NA 139 09/26/2011 0901   K 4.6 09/26/2011 0901   CL 101 09/26/2011 0901   CO2 29 09/26/2011 0901   GLUCOSE 89 09/26/2011 0901   BUN 19 09/26/2011 0901   CREATININE 0.9 09/26/2011 0901   CALCIUM 9.6 09/26/2011 0901   GFRNONAA 82.94 08/16/2009 1020   GFRAA 101 05/10/2008 0903    Lipid Panel     Component Value Date/Time   CHOL 241*  09/26/2011 0901   TRIG 81.0 09/26/2011 0901   HDL 44.10 09/26/2011 0901   CHOLHDL 5 09/26/2011 0901   VLDL 16.2 09/26/2011 0901    CBC    Component Value Date/Time   WBC 5.2 09/26/2011 0901   RBC 4.59 09/26/2011 0901   HGB 13.6 09/26/2011 0901   HCT 40.1 09/26/2011 0901   PLT 216.0 09/26/2011 0901   MCV 87.5 09/26/2011 0901   MCHC 33.8 09/26/2011 0901   RDW 12.9 09/26/2011 0901   LYMPHSABS 2.2 09/26/2011 0901   MONOABS 0.4 09/26/2011 0901   EOSABS 0.2 09/26/2011 0901   BASOSABS 0.0 09/26/2011 0901

## 2011-12-19 NOTE — Assessment & Plan Note (Signed)
We'll repeat to make certain they're correct. In addition we have asked her to cut out saturated fats and to walk 3 hours a week.

## 2011-12-19 NOTE — Assessment & Plan Note (Signed)
Excellent control.   

## 2011-12-27 ENCOUNTER — Other Ambulatory Visit (INDEPENDENT_AMBULATORY_CARE_PROVIDER_SITE_OTHER): Payer: BC Managed Care – PPO

## 2011-12-27 DIAGNOSIS — I10 Essential (primary) hypertension: Secondary | ICD-10-CM

## 2011-12-27 LAB — LDL CHOLESTEROL, DIRECT: Direct LDL: 139.3 mg/dL

## 2011-12-27 LAB — LIPID PANEL: Total CHOL/HDL Ratio: 5

## 2012-01-08 ENCOUNTER — Telehealth: Payer: Self-pay | Admitting: Cardiology

## 2012-01-08 NOTE — Telephone Encounter (Signed)
New msg Pt calling about lab results

## 2012-01-08 NOTE — Telephone Encounter (Signed)
Pt aware of lab results. She does not wish to go on a statin at this time. She would like to continue diet and exercise  She did go out of town recently and forgot to take her HCTZ with her for 5 days. She experienced " I was so bloated I could wear the pants I brought with me"  Pt would like to know if she can decrease her HCTZ "b/c of all of the things I have read about it and a person my age"  I will forward to Dr. Daleen Squibb for review. Mylo Red RN

## 2012-01-11 NOTE — Telephone Encounter (Signed)
Okay with me 

## 2012-01-11 NOTE — Telephone Encounter (Signed)
LMOVM after reaching pt earlier ( but she had another call) Okay to decrease HCTZ. LM for pt to call back  Mylo Red RN

## 2012-02-28 ENCOUNTER — Telehealth: Payer: Self-pay | Admitting: Gastroenterology

## 2012-02-28 NOTE — Telephone Encounter (Signed)
Left message for patient to call back  

## 2012-02-29 NOTE — Telephone Encounter (Signed)
Seen last in 2010. Unsure about the cause of her symptoms. Has had constipation in the past. Not sure she followed through with colonoscopy in 2010 as recommended. Evaluation in ED or urgent care center today.

## 2012-02-29 NOTE — Telephone Encounter (Signed)
I have left her a detailed message due to the lateness of the day and that it is a weekend. I have relayed that she needs to be evaluated in the ED of Urgent care today.  She is asked to call back if she has any questions today.

## 2012-02-29 NOTE — Telephone Encounter (Signed)
Patient has a a week history of bloating, she reports very painful and uncomfortable yesterday.  She reports that this is pressure and hard..  She does not have intestinal gas and her bowels are very regular. She denies nausea, vomiting, rectal bleeding diarrhea or constipation.  She does report that she is a little better today.  She is advised to try gas-x or phazyme over the weekend and also try Align.  Dr. Russella Dar please advise.

## 2012-03-04 ENCOUNTER — Encounter: Payer: Self-pay | Admitting: Internal Medicine

## 2012-03-04 ENCOUNTER — Ambulatory Visit (INDEPENDENT_AMBULATORY_CARE_PROVIDER_SITE_OTHER): Payer: BC Managed Care – PPO | Admitting: Internal Medicine

## 2012-03-04 ENCOUNTER — Telehealth: Payer: Self-pay | Admitting: Cardiology

## 2012-03-04 VITALS — BP 110/80 | Temp 98.2°F | Wt 135.0 lb

## 2012-03-04 DIAGNOSIS — R5383 Other fatigue: Secondary | ICD-10-CM

## 2012-03-04 DIAGNOSIS — I10 Essential (primary) hypertension: Secondary | ICD-10-CM

## 2012-03-04 NOTE — Telephone Encounter (Signed)
plz return call top patient at hM#, she has questions about lab tests

## 2012-03-04 NOTE — Patient Instructions (Signed)
Limit your sodium (Salt) intake    It is important that you exercise regularly, at least 20 minutes 3 to 4 times per week.  If you develop chest pain or shortness of breath seek  medical attention.  Call or return to clinic prn if these symptoms worsen or fail to improve as anticipated.  Return in 6 months for follow-up  

## 2012-03-04 NOTE — Telephone Encounter (Signed)
Calling for results of thyroid last done.  I gave her the results of her TSH which was done April 2013.

## 2012-03-04 NOTE — Progress Notes (Signed)
Subjective:    Patient ID: Molly Peterson, female    DOB: 03/04/1966, 46 y.o.   MRN: [id number]  HPI 46 year old patient who has treated hypertension controlled on diuretic therapy. She has had concerns with fluid retention in the past. Complaints today include fatigue in spite of a daily exercise regimen. She's also had some modest weight gain in spite of efforts at weight loss.  She normally has energy in the afternoon but presently seems to tire easily.  Past Medical History  Diagnosis Date  . ANXIETY 09/18/2006  . REACTION, ACUTE STRESS W/EMOTIONAL DSTURB 12/23/2006  . DEPRESSION 09/18/2006  . HYPERTENSION 09/18/2006  . DERMATITIS, SEBORRHEIC NEC 01/06/2007  . Palpitations 09/18/2006  . Edema     symptom  . Acute pharyngitis   . Urinary tract infection, site not specified   . Flatulence, eructation, and gas pain   . Unspecified constipation     History   Social History  . Marital Status: Married    Spouse Name: N/A    Number of Children: 2  . Years of Education: N/A   Occupational History  .     Social History Main Topics  . Smoking status: Former Smoker    Quit date: 06/18/2002  . Smokeless tobacco: Never Used  . Alcohol Use: Yes     occasionally  . Drug Use: No  . Sexually Active: Not on file   Other Topics Concern  . Not on file   Social History Narrative  . No narrative on file    Past Surgical History  Procedure Date  . Appendectomy unknown  . Abdominal hysterectomy     partial  . Tonsillectomy unknown  . Caesarean     x2    Family History  Problem Relation Age of Onset  . Heart disease Mother     PVC @age  55  . Heart disease Father     MI @ags  56 , CABG @55   . Hypertension Father     No Known Allergies  Current Outpatient Prescriptions on File Prior to Visit  Medication Sig Dispense Refill  . hydrochlorothiazide (HYDRODIURIL) 25 MG tablet Take 1 tablet (25 mg total) by mouth daily.  90 tablet  0  . lisinopril (PRINIVIL,ZESTRIL) 10 MG tablet  TAKE ONE TABLET BY MOUTH ONE TIME DAILY  90 tablet  2    BP 110/80  Temp 98.2 F (36.8 C) (Oral)  Wt 135 lb (61.236 kg)     Wt Readings from Last 3 Encounters:  03/04/12 135 lb (61.236 kg)  12/19/11 130 lb (58.968 kg)  11/16/11 133 lb 1 oz (60.357 kg)    Review of Systems  Constitutional: Positive for fatigue and unexpected weight change.  HENT: Negative for hearing loss, congestion, sore throat, rhinorrhea, dental problem, sinus pressure and tinnitus.   Eyes: Negative for pain, discharge and visual disturbance.  Respiratory: Negative for cough and shortness of breath.   Cardiovascular: Negative for chest pain, palpitations and leg swelling.  Gastrointestinal: Negative for nausea, vomiting, abdominal pain, diarrhea, constipation, blood in stool and abdominal distention.  Genitourinary: Negative for dysuria, urgency, frequency, hematuria, flank pain, vaginal bleeding, vaginal discharge, difficulty urinating, vaginal pain and pelvic pain.  Musculoskeletal: Negative for joint swelling, arthralgias and gait problem.  Skin: Negative for rash.  Neurological: Negative for dizziness, syncope, speech difficulty, weakness, numbness and headaches.  Hematological: Negative for adenopathy.  Psychiatric/Behavioral: Negative for behavioral problems, dysphoric mood and agitation. The patient is not nervous/anxious.  Objective:   Physical Exam  Constitutional: She is oriented to person, place, and time. She appears well-developed and well-nourished.       Appears healthy no distress Blood pressure 120/80  HENT:  Head: Normocephalic.  Right Ear: External ear normal.  Left Ear: External ear normal.  Mouth/Throat: Oropharynx is clear and moist.  Eyes: Conjunctivae normal and EOM are normal. Pupils are equal, round, and reactive to light.  Neck: Normal range of motion. Neck supple. No thyromegaly present.  Cardiovascular: Normal rate, regular rhythm, normal heart sounds and intact  distal pulses.   Pulmonary/Chest: Effort normal and breath sounds normal.  Abdominal: Soft. Bowel sounds are normal. She exhibits no mass. There is no tenderness.  Musculoskeletal: Normal range of motion.  Lymphadenopathy:    She has no cervical adenopathy.  Neurological: She is alert and oriented to person, place, and time.  Skin: Skin is warm and dry. No rash noted.  Psychiatric: She has a normal mood and affect. Her behavior is normal.          Assessment & Plan:   Hypertension stable Fatigue. Family history of hypothyroidism. We'll check another TSH which was normal in the spring Recommend the exercise activities changed to a 3-4 time a week regimen Call if unimproved

## 2012-03-05 LAB — CBC WITH DIFFERENTIAL/PLATELET
Basophils Relative: 1.3 % (ref 0.0–3.0)
Eosinophils Absolute: 0.4 10*3/uL (ref 0.0–0.7)
Eosinophils Relative: 4.6 % (ref 0.0–5.0)
Lymphocytes Relative: 36.3 % (ref 12.0–46.0)
Neutrophils Relative %: 51.9 % (ref 43.0–77.0)
RBC: 4.25 Mil/uL (ref 3.87–5.11)
WBC: 7.5 10*3/uL (ref 4.5–10.5)

## 2012-03-05 LAB — TSH: TSH: 0.86 u[IU]/mL (ref 0.35–5.50)

## 2012-03-17 ENCOUNTER — Telehealth: Payer: Self-pay | Admitting: Internal Medicine

## 2012-03-17 NOTE — Telephone Encounter (Signed)
Patient called stating that she would like a call back with lab results and states that she has never got any follow up. Please assist.

## 2012-03-17 NOTE — Telephone Encounter (Signed)
Caller: [name]/Patient; Patient Name: Molly Peterson; PCP: Eleonore Chiquito [facility]); Best Callback Phone Number: [phone].  Called concerned MD did only TSH instead of complete thyroid panel.  Upset because it costs more money to come again.  Mother and mother in law had delayed diagnosis of graves disease after feeling poorly for 1.5 years.  Per Epic, confirmed the only tests done 03/04/12 were the  CBC and TSH.  Would like explanation why complete thyroid panel was not done since she specifically asked for them at the office visit 03/04/12. Upset that "no one called me with the results."  Explained that abnormal results are called.  Information noted and sent to [facility] pool for call back for caller requests to speak with PCP and nonurgent question per PCP Calls-No Triage guideline.

## 2012-03-17 NOTE — Telephone Encounter (Signed)
Pt extremely frustrated that she has never received a call to advise of her lab results.  Most recently she had labs done on 03/04/12 and had to call today and request results.  Pt also concerned that she had labs drawn back in April and never received a call to advise of the results but when she did call back she was told that all her labs were "wonderful".  However, when she went to see Dr. Daleen Squibb in July and he reviewed the same labs and advised that her cholesterol was elevated.  Pt concerned that her health is not being taken seriously and is considering finding a new PCP.

## 2012-03-18 NOTE — Telephone Encounter (Signed)
Called and discussed 9-30

## 2012-04-01 ENCOUNTER — Other Ambulatory Visit: Payer: Self-pay | Admitting: Internal Medicine

## 2012-04-23 ENCOUNTER — Telehealth: Payer: Self-pay | Admitting: Cardiology

## 2012-04-23 NOTE — Telephone Encounter (Signed)
New Problem:    Patient called in because her BP is 146/100 and she would like to know if she is supposed to come in and have her Cholesterol checked.  Please call back.

## 2012-04-23 NOTE — Telephone Encounter (Signed)
Pt states that she has stopped her lisinopril and now her BP is 146/100.  Advised pt to restart lisinopril and keep a 3 day record of blood pressures and call us results. Pt agreed.

## 2012-05-26 ENCOUNTER — Telehealth: Payer: Self-pay | Admitting: Internal Medicine

## 2012-05-26 NOTE — Telephone Encounter (Signed)
Pt has a question concerning blood work she had done in sept 2013. Pt would like to know her glucose level

## 2012-05-26 NOTE — Telephone Encounter (Signed)
Spoke to pt told her last Glucose was done in April it was 22. Pt verbalized understanding. Pt c/o legs are discolored and swollen was bad over the weekend but is better. Told pt needs an appt to evaluate. Pt verbalized understanding and stated going out of town tomorrow until Sunday. Told her fine need to make an appt when she returns. Pt verbalized understanding.

## 2012-07-16 ENCOUNTER — Encounter: Payer: Self-pay | Admitting: Cardiology

## 2012-07-16 ENCOUNTER — Ambulatory Visit (INDEPENDENT_AMBULATORY_CARE_PROVIDER_SITE_OTHER): Payer: BC Managed Care – PPO | Admitting: Cardiology

## 2012-07-16 VITALS — BP 128/90 | HR 77 | Ht 62.0 in | Wt 135.0 lb

## 2012-07-16 DIAGNOSIS — R609 Edema, unspecified: Secondary | ICD-10-CM

## 2012-07-16 DIAGNOSIS — F411 Generalized anxiety disorder: Secondary | ICD-10-CM

## 2012-07-16 DIAGNOSIS — I10 Essential (primary) hypertension: Secondary | ICD-10-CM

## 2012-07-16 NOTE — Assessment & Plan Note (Signed)
Continue low-dose diuretic.

## 2012-07-16 NOTE — Assessment & Plan Note (Signed)
She is clearly hyperventilating. I've advised her on how to calm herself down or also to use a paper bag. She was reassured this was noncardiac.

## 2012-07-16 NOTE — Patient Instructions (Addendum)
Your physician discussed the importance of regular exercise and recommended that you start or continue a regular exercise program for good health.  Continue taking your current medications as directed.  Your physician wants you to follow-up in: 1 year. You will receive a reminder letter in the mail two months in advance. If you don't receive a letter, please call our office to schedule the follow-up appointment.

## 2012-07-16 NOTE — Progress Notes (Signed)
HPI Molly Peterson returns today for evaluation and management of her history of hypertension. She's been a lot of stress lately. She's got out of her exercise routine. She's been waking up in the morning with chest discomfort without radiation. She's also had some numbness around her mouth and her fingertips. She denies any presyncope or syncope but has had some dizziness.  She's not been taking her blood pressure medicines every day.  When she does exercise she has no chest discomfort and has excellent exercise tolerance. She in fact feels better.  Past Medical History  Diagnosis Date  . ANXIETY 09/18/2006  . REACTION, ACUTE STRESS W/EMOTIONAL DSTURB 12/23/2006  . DEPRESSION 09/18/2006  . HYPERTENSION 09/18/2006  . DERMATITIS, SEBORRHEIC NEC 01/06/2007  . Palpitations 09/18/2006  . Edema     symptom  . Acute pharyngitis   . Urinary tract infection, site not specified   . Flatulence, eructation, and gas pain   . Unspecified constipation     Current Outpatient Prescriptions  Medication Sig Dispense Refill  . hydrochlorothiazide (HYDRODIURIL) 25 MG tablet TAKE ONE TABLET BY MOUTH ONE TIME DAILY  90 tablet  3  . lisinopril (PRINIVIL,ZESTRIL) 10 MG tablet         No Known Allergies  Family History  Problem Relation Age of Onset  . Heart disease Mother     PVC @age  55  . Heart disease Father     MI @ags  50 , CABG @55   . Hypertension Father     History   Social History  . Marital Status: Married    Spouse Name: N/A    Number of Children: 2  . Years of Education: N/A   Occupational History  .     Social History Main Topics  . Smoking status: Former Smoker    Quit date: 06/18/2002  . Smokeless tobacco: Never Used  . Alcohol Use: Yes     Comment: occasionally  . Drug Use: No  . Sexually Active: Not on file   Other Topics Concern  . Not on file   Social History Narrative  . No narrative on file    ROS ALL NEGATIVE EXCEPT THOSE NOTED IN HPI  PE  General Appearance: well  developed, well nourished in no acute distress HEENT: symmetrical face, PERRLA, good dentition  Neck: no JVD, thyromegaly, or adenopathy, trachea midline Chest: symmetric without deformity Cardiac: PMI non-displaced, RRR, normal S1, S2, no gallop or murmur Lung: clear to ausculation and percussion Vascular: all pulses full without bruits  Abdominal: nondistended, nontender, good bowel sounds, no HSM, no bruits Extremities: no cyanosis, clubbing or edema, no sign of DVT, no varicosities  Skin: normal color, no rashes Neuro: alert and oriented x 3, non-focal Pysch: normal affect  EKG Normal sinus rhythm, right atrial enlargement, poor R wave progression into precordium, no acute change  BMET    Component Value Date/Time   NA 139 09/26/2011 0901   K 4.6 09/26/2011 0901   CL 101 09/26/2011 0901   CO2 29 09/26/2011 0901   GLUCOSE 89 09/26/2011 0901   BUN 19 09/26/2011 0901   CREATININE 0.9 09/26/2011 0901   CALCIUM 9.6 09/26/2011 0901   GFRNONAA 82.94 08/16/2009 1020   GFRAA 101 05/10/2008 0903    Lipid Panel     Component Value Date/Time   CHOL 219* 12/27/2011 0859   TRIG 126.0 12/27/2011 0859   HDL 42.40 12/27/2011 0859   CHOLHDL 5 12/27/2011 0859   VLDL 25.2 12/27/2011 0859    CBC  Component Value Date/Time   WBC 7.5 03/04/2012 1653   RBC 4.25 03/04/2012 1653   HGB 12.5 03/04/2012 1653   HCT 37.6 03/04/2012 1653   PLT 200.0 03/04/2012 1653   MCV 88.5 03/04/2012 1653   MCHC 33.3 03/04/2012 1653   RDW 13.6 03/04/2012 1653   LYMPHSABS 2.7 03/04/2012 1653   MONOABS 0.4 03/04/2012 1653   EOSABS 0.4 03/04/2012 1653   BASOSABS 0.1 03/04/2012 1653

## 2012-07-16 NOTE — Assessment & Plan Note (Signed)
She was advised her diastolic blood pressure 60, which she thinks is too low, we'll not calls presyncope or syncope. I've advised her take her blood pressure medicines every day including her diuretic.

## 2012-07-18 NOTE — Addendum Note (Signed)
Addended by: Reine Just on: 07/18/2012 10:30 AM   Modules accepted: Orders

## 2012-08-04 ENCOUNTER — Encounter: Payer: Self-pay | Admitting: Family

## 2012-08-04 ENCOUNTER — Ambulatory Visit (INDEPENDENT_AMBULATORY_CARE_PROVIDER_SITE_OTHER): Payer: BC Managed Care – PPO | Admitting: Family

## 2012-08-04 VITALS — BP 100/70 | HR 88 | Temp 97.9°F | Wt 137.0 lb

## 2012-08-04 DIAGNOSIS — R0982 Postnasal drip: Secondary | ICD-10-CM

## 2012-08-04 DIAGNOSIS — J301 Allergic rhinitis due to pollen: Secondary | ICD-10-CM

## 2012-08-04 DIAGNOSIS — J329 Chronic sinusitis, unspecified: Secondary | ICD-10-CM

## 2012-08-04 DIAGNOSIS — J309 Allergic rhinitis, unspecified: Secondary | ICD-10-CM

## 2012-08-04 DIAGNOSIS — H6120 Impacted cerumen, unspecified ear: Secondary | ICD-10-CM

## 2012-08-04 MED ORDER — FLUTICASONE PROPIONATE 50 MCG/ACT NA SUSP: 2.0000 | Freq: Every day | NASAL | Status: DC

## 2012-08-04 NOTE — Patient Instructions (Addendum)
1. Flonase 2 sprays in each nostril once a day. 2. Claritin once daily OTC  Cerumen Impaction A cerumen impaction is when the wax in your ear forms a plug. This plug usually causes reduced hearing. Sometimes it also causes an earache or dizziness. Removing a cerumen impaction can be difficult and painful. The wax sticks to the ear canal. The canal is sensitive and bleeds easily. If you try to remove a heavy wax buildup with a cotton tipped swab, you may push it in further. Irrigation with water, suction, and small ear curettes may be used to clear out the wax. If the impaction is fixed to the skin in the ear canal, ear drops may be needed for a few days to loosen the wax. People who build up a lot of wax frequently can use ear wax removal products available in your local drugstore. SEEK MEDICAL CARE IF:  You develop an earache, increased hearing loss, or marked dizziness. Document Released: 07/12/2004 Document Revised: 08/27/2011 Document Reviewed: 09/01/2009 [facility] Patient Information 2013 [place], [place].

## 2012-08-04 NOTE — Progress Notes (Signed)
Subjective:    Patient ID: Molly Peterson, female    DOB: 01-14-1966, 47 y.o.   MRN: [id number]  HPI 47 year old white female, nonsmoker, patient of Dr. Cato Mulligan is in today with complaints of sore throat, postnasal drip, cough x1 month. She's been taken NyQuil at night and Delsym during the day that has helped but her symptoms never resolved. She denies any fever or increased achiness. She has 2 dogs at home x several years.    Review of Systems  Constitutional: Negative.   HENT: Positive for congestion and postnasal drip.   Eyes: Negative.   Respiratory: Positive for cough.   Cardiovascular: Negative.   Gastrointestinal: Negative.   Genitourinary: Negative.   Musculoskeletal: Negative.   Skin: Negative.   Hematological: Negative.   Psychiatric/Behavioral: Negative.    Past Medical History  Diagnosis Date  . ANXIETY 09/18/2006  . REACTION, ACUTE STRESS W/EMOTIONAL DSTURB 12/23/2006  . DEPRESSION 09/18/2006  . HYPERTENSION 09/18/2006  . DERMATITIS, SEBORRHEIC NEC 01/06/2007  . Palpitations 09/18/2006  . Edema     symptom  . Acute pharyngitis   . Urinary tract infection, site not specified   . Flatulence, eructation, and gas pain   . Unspecified constipation     History   Social History  . Marital Status: Married    Spouse Name: N/A    Number of Children: 2  . Years of Education: N/A   Occupational History  .     Social History Main Topics  . Smoking status: Former Smoker    Quit date: 06/18/2002  . Smokeless tobacco: Never Used  . Alcohol Use: Yes     Comment: occasionally  . Drug Use: No  . Sexually Active: Not on file   Other Topics Concern  . Not on file   Social History Narrative  . No narrative on file    Past Surgical History  Procedure Laterality Date  . Appendectomy  unknown  . Abdominal hysterectomy      partial  . Tonsillectomy  unknown  . Caesarean      x2    Family History  Problem Relation Age of Onset  . Heart disease Mother     PVC  @age  55  . Heart disease Father     MI @ags  50 , CABG @55   . Hypertension Father     No Known Allergies  Current Outpatient Prescriptions on File Prior to Visit  Medication Sig Dispense Refill  . hydrochlorothiazide (HYDRODIURIL) 25 MG tablet TAKE ONE TABLET BY MOUTH ONE TIME DAILY  90 tablet  3  . lisinopril (PRINIVIL,ZESTRIL) 10 MG tablet        No current facility-administered medications on file prior to visit.    BP 100/70  Pulse 88  Temp(Src) 97.9 F (36.6 C) (Oral)  Wt 137 lb (62.143 kg)  BMI 25.05 kg/m2  SpO2 98%chart    Objective:   Physical Exam  Constitutional: She is oriented to person, place, and time. She appears well-developed and well-nourished.  HENT:  Right Ear: External ear normal.  Left Ear: External ear normal.  Nose: Nose normal.  Mouth/Throat: Oropharynx is clear and moist.  Right ear cerumen impaction  Neck: Normal range of motion. Neck supple.  Cardiovascular: Normal rate, regular rhythm and normal heart sounds.   Pulmonary/Chest: Effort normal and breath sounds normal.  Neurological: She is alert and oriented to person, place, and time.  Skin: Skin is warm and dry.  Psychiatric: She has a normal mood and affect.  Informed consent was obtained and peroxide gel was inserted into the  right earusing the lavage kit the ear were lavaged until clean.Inspection with a cerumen spoon removed residual wax. Patient tolerated the procedure well.      Assessment & Plan:  Assessment: 1. Rhinitis 2. Cerumen impaction  Plan: Flonase 2 sprays in each doctor once a day. Claritin over-the-counter once daily. Patient call the office if symptoms worsen or persist. Recheck a schedule, and as needed.

## 2012-10-29 ENCOUNTER — Encounter: Payer: Self-pay | Admitting: Gastroenterology

## 2012-11-11 ENCOUNTER — Telehealth: Payer: Self-pay | Admitting: Cardiology

## 2012-11-11 NOTE — Telephone Encounter (Signed)
New problem   Pt want to know which doctor she will be seen since Dr Daleen Squibb will be leaving, her name wasn't on the list. Please call pt.

## 2012-11-11 NOTE — Telephone Encounter (Signed)
Spoke with patient who is concerned about which cardiologist she will see once Dr. Clint Guy. Patient has recently been in the hospital with her father and had a bad experience with some of our cardiologists so she would like to only see Dr. Tenny Craw or Dr. Ladona Ridgel. I informed patient that her f/u is scheduled for January 2015 and that schedule is not available yet.  I will route to Dr. Ross/Cory for approval to schedule future appointments with Dr. Tenny Craw. Patient verbalized understanding.  Patient shared complaints regarding her father's care which I will share with Doylene Bode, RN Clinical Services Manager.

## 2012-11-12 NOTE — Telephone Encounter (Signed)
Will seep patient in winter

## 2012-11-12 NOTE — Telephone Encounter (Signed)
Recall put in [facility] for winter appt with Dr. Tenny Craw.

## 2012-11-14 ENCOUNTER — Other Ambulatory Visit (INDEPENDENT_AMBULATORY_CARE_PROVIDER_SITE_OTHER): Payer: BC Managed Care – PPO

## 2012-11-14 ENCOUNTER — Encounter: Payer: Self-pay | Admitting: Gastroenterology

## 2012-11-14 ENCOUNTER — Ambulatory Visit (INDEPENDENT_AMBULATORY_CARE_PROVIDER_SITE_OTHER): Payer: BC Managed Care – PPO | Admitting: Gastroenterology

## 2012-11-14 VITALS — BP 98/70 | HR 76 | Ht 62.5 in | Wt 137.4 lb

## 2012-11-14 DIAGNOSIS — R1084 Generalized abdominal pain: Secondary | ICD-10-CM

## 2012-11-14 DIAGNOSIS — R140 Abdominal distension (gaseous): Secondary | ICD-10-CM

## 2012-11-14 DIAGNOSIS — R141 Gas pain: Secondary | ICD-10-CM

## 2012-11-14 DIAGNOSIS — R143 Flatulence: Secondary | ICD-10-CM

## 2012-11-14 DIAGNOSIS — R635 Abnormal weight gain: Secondary | ICD-10-CM

## 2012-11-14 DIAGNOSIS — R142 Eructation: Secondary | ICD-10-CM

## 2012-11-14 LAB — CBC WITH DIFFERENTIAL/PLATELET
Basophils Absolute: 0.1 10*3/uL (ref 0.0–0.1)
Eosinophils Absolute: 0.2 10*3/uL (ref 0.0–0.7)
Eosinophils Relative: 2.6 % (ref 0.0–5.0)
MCV: 86 fl (ref 78.0–100.0)
Monocytes Absolute: 0.5 10*3/uL (ref 0.1–1.0)
Neutrophils Relative %: 59 % (ref 43.0–77.0)
Platelets: 247 10*3/uL (ref 150.0–400.0)
RDW: 13.2 % (ref 11.5–14.6)
WBC: 7.9 10*3/uL (ref 4.5–10.5)

## 2012-11-14 LAB — BASIC METABOLIC PANEL
BUN: 17 mg/dL (ref 6–23)
Chloride: 98 mEq/L (ref 96–112)
Glucose, Bld: 86 mg/dL (ref 70–99)
Potassium: 3.6 mEq/L (ref 3.5–5.1)

## 2012-11-14 LAB — HEPATIC FUNCTION PANEL
ALT: 19 U/L (ref 0–35)
AST: 20 U/L (ref 0–37)
Total Bilirubin: 0.8 mg/dL (ref 0.3–1.2)

## 2012-11-14 LAB — TSH: TSH: 0.65 u[IU]/mL (ref 0.35–5.50)

## 2012-11-14 NOTE — Patient Instructions (Addendum)
You have been scheduled for a CT scan of the abdomen and pelvis at [facility] ([address]---this is in the same building as [facility]).   Your physician has requested that you go to the basement for the following lab work before leaving today:[place] health panel.  You are scheduled on 11/18/12 at 9:30am. You should arrive 15 minutes prior to your appointment time for registration. Please follow the written instructions below on the day of your exam:  WARNING: IF YOU ARE ALLERGIC TO IODINE/X-RAY DYE, PLEASE NOTIFY RADIOLOGY IMMEDIATELY AT [phone]! YOU WILL BE GIVEN A 13 HOUR PREMEDICATION PREP.  1) Do not eat or drink anything after 5:30am (4 hours prior to your test) 2) You have been given 2 bottles of oral contrast to drink. The solution may taste better if refrigerated, but do NOT add ice or any other liquid to this solution. Shake well before drinking.    Drink 1 bottle of contrast @ 7:30am (2 hours prior to your exam)  Drink 1 bottle of contrast @ 8:30am (1 hour prior to your exam)  You may take any medications as prescribed with a small amount of water except for the following: Metformin, Glucophage, Glucovance, Avandamet, Riomet, Fortamet, Actoplus Met, Janumet, Glumetza or Metaglip. The above medications must be held the day of the exam AND 48 hours after the exam.  The purpose of you drinking the oral contrast is to aid in the visualization of your intestinal tract. The contrast solution may cause some diarrhea. Before your exam is started, you will be given a small amount of fluid to drink. Depending on your individual set of symptoms, you may also receive an intravenous injection of x-ray contrast/dye. Plan on being at [facility] for 30 minutes or long, depending on the type of exam you are having performed.  This test typically takes 30-45 minutes to complete.  If you have any questions regarding your exam or if you need to reschedule, you may  call the CT department at [phone] between the hours of 8:00 am and 5:00 pm, Monday-Friday.  ________________________________________________________________________  Thank you for choosing me and [facility].  Venita Lick. Pleas Koch., MD., Clementeen Graham

## 2012-11-14 NOTE — Progress Notes (Signed)
History of Present Illness: This is a 47 year old female who relates worsening problems with abdominal bloating, lower abdominal pain, intestinal gas and a 12 pound weight gain the past few months. She was recently seen Dr. Rana Snare and she has recently entered menopause. She is very concerned about her weight gain and states her diet has not substantially changed and her exercise has increased. She generally has a daily bowel movement but occasionally she will have a second bowel movement later in the day associated with mild anal discomfort. She has been previously evaluated her office in 2004 at 2010. 2010 was recommended to undergo colonoscopy she did not proceed. Denies weight loss,  constipation, diarrhea, change in stool caliber, melena, hematochezia, nausea, vomiting, dysphagia, reflux symptoms, chest pain.  Review of Systems: Pertinent positive and negative review of systems were noted in the above HPI section. All other review of systems were otherwise negative.  Current Medications, Allergies, Past Medical History, Past Surgical History, Family History and Social History were reviewed in [employer] record.  Physical Exam: General: Well developed , well nourished, no acute distress Head: Normocephalic and atraumatic Eyes:  sclerae anicteric, EOMI Ears: Normal auditory acuity Mouth: No deformity or lesions Neck: Supple, no masses or thyromegaly Lungs: Clear throughout to auscultation Heart: Regular rate and rhythm; no murmurs, rubs or bruits Abdomen: Soft, non tender and non distended. No masses, hepatosplenomegaly or hernias noted. Normal Bowel sounds Rectal: Small external hemorrhoids, no internal lesions, no anal canal tenderness, Hemoccult negative stool the vault Musculoskeletal: Symmetrical with no gross deformities  Skin: No lesions on visible extremities Pulses:  Normal pulses noted Extremities: No clubbing, cyanosis, edema or deformities noted Neurological:  Alert oriented x 4, grossly nonfocal Cervical Nodes:  No significant cervical adenopathy Inguinal Nodes: No significant inguinal adenopathy Psychological:  Alert and cooperative. Normal mood and affect  Assessment and Recommendations:  1. Abdominal bloating and lower abdominal pain. Rule out constipation, gas distention, an occult mass or symptoms simply secondary to weight gain. Standard blood panel. Schedule abdominal/pelvic CT scan. Consider colonoscopy.

## 2012-11-18 ENCOUNTER — Other Ambulatory Visit: Payer: BC Managed Care – PPO

## 2012-12-12 ENCOUNTER — Telehealth: Payer: Self-pay | Admitting: Internal Medicine

## 2012-12-12 NOTE — Telephone Encounter (Signed)
Pt has an allergic reaction to pineapple. Pt called allergist, they are going to do testing, but until then advised pt until then to get Epipen in case it happens again before pt can be seen..  Allergist could not RX because she is a new pt and has not ben seen for 6 weeks. Advised pt to call PCP. Pharm:  [facility]

## 2012-12-14 ENCOUNTER — Other Ambulatory Visit: Payer: Self-pay | Admitting: Internal Medicine

## 2012-12-15 NOTE — Telephone Encounter (Signed)
Ok for epi pen 

## 2012-12-15 NOTE — Telephone Encounter (Signed)
Okay to order Epi pen 

## 2012-12-16 MED ORDER — EPINEPHRINE 0.3 MG/0.3ML IJ SOAJ: 0.3000 mg | Freq: Once | INTRAMUSCULAR | Status: DC | PRN

## 2012-12-16 NOTE — Telephone Encounter (Signed)
Spoke to pt told her Rx for Epi pen was sent to pharmacy. Pt verbalized understanding.

## 2012-12-31 ENCOUNTER — Telehealth: Payer: Self-pay | Admitting: Internal Medicine

## 2012-12-31 NOTE — Telephone Encounter (Signed)
PT is calling to request a refill of her ALPRAZolam (XANAX) 0.25 MG tablet. PT is requesting it be sent to [facility] on [address]. PT states that she only has it filled when she is extremely stressed out of traveling alone, and she leaves Friday morning for a business trip. Please assist.

## 2013-01-01 MED ORDER — ALPRAZOLAM 0.25 MG PO TABS: 0.2500 mg | ORAL_TABLET | Freq: Three times a day (TID) | ORAL | Status: DC | PRN

## 2013-01-01 NOTE — Telephone Encounter (Signed)
Please call pt on cell [phone] once med has been call into pharm

## 2013-01-01 NOTE — Telephone Encounter (Signed)
Left detailed message Rx was called into pharmacy. 

## 2013-04-01 ENCOUNTER — Other Ambulatory Visit: Payer: Self-pay | Admitting: Internal Medicine

## 2013-04-03 NOTE — Telephone Encounter (Signed)
Pt is out of med and needs lisinopril (PRINIVIL,ZESTRIL) 10 MG tablet Pt last seen 03/04/12 [facility] [address]

## 2013-04-23 ENCOUNTER — Other Ambulatory Visit: Payer: Self-pay

## 2013-06-02 ENCOUNTER — Other Ambulatory Visit: Payer: Self-pay | Admitting: Internal Medicine

## 2013-06-22 ENCOUNTER — Ambulatory Visit (INDEPENDENT_AMBULATORY_CARE_PROVIDER_SITE_OTHER): Payer: BC Managed Care – PPO | Admitting: Internal Medicine

## 2013-06-22 ENCOUNTER — Encounter: Payer: Self-pay | Admitting: Internal Medicine

## 2013-06-22 VITALS — BP 124/80 | HR 74 | Ht 63.0 in | Wt 142.0 lb

## 2013-06-22 DIAGNOSIS — R002 Palpitations: Secondary | ICD-10-CM

## 2013-06-22 NOTE — Patient Instructions (Signed)
Your physician wants you to follow-up in: [place] will receive a reminder letter in the mail two months in advance. If you don't receive a letter, please call our office to schedule the follow-up appointment.   Your physician recommends that you HAVE LAB WORK TODAY

## 2013-06-22 NOTE — Progress Notes (Signed)
HPI Molly Peterson is a 48 yo who has a history of HTN, palpitations, Fhx of CAD  She was previously followed by [facility]  Last seen in Jan 2014.   Her father did have coronary disease in his mid-50s but was a 3 pack-a-day smoker. She does not smoke. She has a history of CP in past. Lipids in July 2013:  Total: 219, LDL 140 HDL 40  Trig 126.  SInce seen the patient says about 1 wk ago she had chest tightness and L shoulder pain.  Not associated with activty  No SOB  Went to bed  Anxious.  Was gone when woke up. On New Years day she walked 90 min  No SOB  No CP   Yesterday she was cleaning drawers.  Developed L shoulder discomfort.  Not bad.  Not positional  Not pleuritic.  Eased off over time   NKDA  Current Outpatient Prescriptions  Medication Sig Dispense Refill  . ALPRAZolam (XANAX) 0.25 MG tablet Take 1 tablet (0.25 mg total) by mouth every 8 (eight) hours as needed for sleep.  30 tablet  0  . Calcium Carbonate-Vitamin D (CALCIUM + D PO) Take by mouth 1 day or 1 dose.      Marland Kitchen EPINEPHrine (EPI-PEN) 0.3 mg/0.3 mL SOAJ Inject 0.3 mLs (0.3 mg total) into the muscle once as needed.  1 Device  2  . fluticasone (FLONASE) 50 MCG/ACT nasal spray Place 2 sprays into the nose daily. As needed      . hydrochlorothiazide (HYDRODIURIL) 25 MG tablet Take one tablet by mouth one time daily  90 tablet  0  . lisinopril (PRINIVIL,ZESTRIL) 10 MG tablet TAKE ONE TABLET BY MOUTH ONE TIME DAILY   90 tablet  0   No current facility-administered medications for this visit.    Past Medical History  Diagnosis Date  . ANXIETY 09/18/2006  . REACTION, ACUTE STRESS W/EMOTIONAL DSTURB 12/23/2006  . DEPRESSION 09/18/2006  . HYPERTENSION 09/18/2006  . DERMATITIS, SEBORRHEIC NEC 01/06/2007  . Palpitations 09/18/2006  . Edema     symptom  . Acute pharyngitis   . Urinary tract infection, site not specified   . Flatulence, eructation, and gas pain   . Unspecified constipation     Past Surgical History  Procedure  Laterality Date  . Appendectomy  unknown  . Abdominal hysterectomy      partial  . Tonsillectomy  unknown  . Caesarean      x2    Family History  Problem Relation Age of Onset  . Heart disease Mother     PVC @age  30  . Heart disease Father     MI @ags  25 , CABG @55   . Hypertension Father     History   Social History  . Marital Status: Married    Spouse Name: N/A    Number of Children: 2  . Years of Education: N/A   Occupational History  .     Social History Main Topics  . Smoking status: Former Smoker    Quit date: 06/18/2002  . Smokeless tobacco: Never Used  . Alcohol Use: Yes     Comment: occasionally  . Drug Use: No  . Sexual Activity: Not on file   Other Topics Concern  . Not on file   Social History Narrative  . No narrative on file    Review of Systems:  All systems reviewed.  They are negative to the above problem except as previously stated.  Vital Signs: BP  124/80  P 74  Wt 142  Physical Exam Patinet is in NAD HEENT:  Normocephalic, atraumatic. EOMI, PERRLA.  Neck: JVP is normal.  No bruits.  Lungs: clear to auscultation. No rales no wheezes.  Heart: Regular rate and rhythm. Normal S1, S2. No S3.   No significant murmurs. PMI not displaced.  Abdomen:  Supple, nontender. Normal bowel sounds. No masses. No hepatomegaly.  Extremities:   Good distal pulses throughout. No lower extremity edema.  Back  No signif tenderness Musculoskeletal :moving all extremities. Good ROM of arms Neuro:   alert and oriented x3.  CN II-XII grossly intact.  EKG  SR 74    Assessment and Plan:  1  Chest tightness, shoulder discomfort.  Atypical  I am not convinced that it is cardiac in origin.  May be musculoskeletal She is due to see physical therapy later ths wk  I asked her to have them evaluate [facility] GI  Would not hurt to take Pepcid for a couple wks Question stress  The patient has been anxious  Though may not be cause, is not helping.  2.  HTN  Good control   Will check labs  3.  HCM  Will get lipids  Not fasting but compare

## 2013-06-23 LAB — BASIC METABOLIC PANEL
BUN: 20 mg/dL (ref 6–23)
CALCIUM: 9.7 mg/dL (ref 8.4–10.5)
CO2: 33 mEq/L — ABNORMAL HIGH (ref 19–32)
Chloride: 98 mEq/L (ref 96–112)
Creatinine, Ser: 0.8 mg/dL (ref 0.4–1.2)
GFR: 80.37 mL/min (ref >60.00)
Glucose, Bld: 89 mg/dL (ref 70–99)
POTASSIUM: 3.9 meq/L (ref 3.5–5.1)
SODIUM: 136 meq/L (ref 135–145)

## 2013-06-23 LAB — LIPID PANEL
CHOL/HDL RATIO: 8
Cholesterol: 268 mg/dL — ABNORMAL HIGH (ref 0–200)
HDL: 35.3 mg/dL — AB (ref >39.00)
TRIGLYCERIDES: 315 mg/dL — AB (ref 0.0–149.0)
VLDL: 63 mg/dL — AB (ref 0.0–40.0)

## 2013-06-23 LAB — LDL CHOLESTEROL, DIRECT: LDL DIRECT: 183 mg/dL

## 2013-07-15 ENCOUNTER — Telehealth: Payer: Self-pay | Admitting: Internal Medicine

## 2013-07-15 MED ORDER — BENZONATATE 200 MG PO CAPS: 200.0000 mg | ORAL_CAPSULE | Freq: Three times a day (TID) | ORAL | Status: DC | PRN

## 2013-07-15 MED ORDER — OSELTAMIVIR PHOSPHATE 75 MG PO CAPS: 75.0000 mg | ORAL_CAPSULE | Freq: Two times a day (BID) | ORAL | Status: DC

## 2013-07-15 NOTE — Telephone Encounter (Signed)
tamiflu 75 one po bid for 5 days and Tessalon 200 mg po TID prn cough #30

## 2013-07-15 NOTE — Telephone Encounter (Signed)
Dr. Elease Hashimoto, please see message and advise. Thanks

## 2013-07-15 NOTE — Telephone Encounter (Signed)
Patient Information:  Caller Name: [name]  Phone: [phone]  Patient: Molly Peterson, Molly Peterson  Gender: Female  DOB: 01/12/1966  Age: 48 Years  PCP: Bluford Kaufmann (Family Practice > 86yrs old)  Pregnant: No  Office Follow Up:  Does the office need to follow up with this patient?: Yes  Instructions For The Office: Requesting Tamiflu and Cough Med be called into [facility] on [address]. Please call her to let her know if Dr. Raliegh Ip is ok with her starting Tamiflu.   Symptoms  Reason For Call & Symptoms: Started with persistant cough overnight and today she is congested with headache and bodyaches. Children tested positve for flu on on 07/13/13 and 07/14/13 and were put on Tamiflu. Today she has some chest tightness and continues to have frequent dry cough. She cannot come into office since she is caring for her children. Requesting Tamiflu and cough med.  Reviewed Health History In EMR: Yes  Reviewed Medications In EMR: Yes  Reviewed Allergies In EMR: Yes  Reviewed Surgeries / Procedures: Yes  Date of Onset of Symptoms: 07/15/2013  Treatments Tried: NyQuill, Vicks DayQuill, Flonase  Treatments Tried Worked: No OB / GYN:  LMP: Unknown  Guideline(s) Used:  Influenza - Seasonal  Disposition Per Guideline:   Discuss with PCP and Callback by Nurse Today  Reason For Disposition Reached:   Patient requests antiviral medicine for influenza and flu symptoms present < 48 hours  Advice Given:  Reassurance  For most healthy adults, influenza feels like a bad cold. The dangers of influenza for normal, healthy people (under 58 years of age) are overrated.  The treatment of influenza depends on your main symptoms. Generally, treatment is the same as for other viral respiratory infections (colds). Bed rest is unnecessary.  Here is some care advice that should help.  Treating the Symptoms of Flu  Fever, Muscle Aches, and Headache: For fever more than 101 F (38.3 C), muscle aches, and headaches, take  acetaminophen every 4-6 hours (Adults 650 mg) OR ibuprofen every 6-8 hours (Adults 400-600 mg).  Sore Throat: Use throat lozenges, hard candy or warm chicken broth.  Cough: Use cough drops.  Hydrate: Drink extra liquids. If the air in your home is dry, use a humidifier.  Expected Course  : The fever lasts 2-3 days, the runny nose 5-10 days, and the cough 2-3 weeks.  Call Back If:  Fever lasts more than 3 days  Runny nose lasts more than 10 days  Cough lasts more than 3 weeks  You become short of breath or worse.  Patient Will Follow Care Advice:  YES

## 2013-07-15 NOTE — Telephone Encounter (Signed)
Left message on voicemail to call office. Dr. Elease Hashimoto covered for Dr. Raliegh Ip. Rx's sent to pharmacy for Tamiflu and Tessalon capsules.

## 2013-07-15 NOTE — Telephone Encounter (Signed)
Spoke to pt told her two Rx's sent to pharmacy. Pt verbalized understanding.

## 2013-07-16 ENCOUNTER — Other Ambulatory Visit: Payer: Self-pay | Admitting: *Deleted

## 2013-07-16 MED ORDER — HYDROCODONE-HOMATROPINE 5-1.5 MG/5ML PO SYRP: 5.0000 mL | ORAL_SOLUTION | Freq: Four times a day (QID) | ORAL | Status: DC | PRN

## 2013-07-16 NOTE — Telephone Encounter (Signed)
Spoke to pt stated she did not get Rx's yesterday pharmacy did not have them and pt has tried Tessalon capsules before and they did not work and her cough is worse today and would like something stronger. Told pt will discuss with Dr. Elease Hashimoto and get back to her. Pt verbalized understanding.

## 2013-07-16 NOTE — Telephone Encounter (Signed)
Spoke to pt told her discussed with Dr. Elease Hashimoto and he will give you a Rx for Hycodan cough syrup is ready for pick, will be at the front desk. Pt verbalized understanding.

## 2013-07-17 ENCOUNTER — Telehealth: Payer: Self-pay | Admitting: Internal Medicine

## 2013-07-17 NOTE — Telephone Encounter (Signed)
Called and spoke with pt and pt is aware.  

## 2013-07-17 NOTE — Telephone Encounter (Signed)
Patient Information:  Caller Name: Molly Peterson  Phone: [phone]  Patient: Molly Peterson, Molly Peterson  Gender: Female  DOB: 08/12/1965  Age: 48 Years  PCP: Bluford Kaufmann (Family Practice > 67yrs old)  Pregnant: No  Office Follow Up:  Does the office need to follow up with this patient?: No  Instructions For The Office: N/A  RN Note:  Home care advice and call back parameters reviewed. Understanding expressed.  Symptoms  Reason For Call & Symptoms: Patient states her children have had the flu  on 1/26 and 1/27 and she began having symptoms on Wednesday 1/28.  She contacted the office and was given Rx for Tamilflu and cough medication.  She did not take the Tamiflu due to cost and came by office  yesterday 07/16/12 and got Rx for Hydromet.  Patient states she coughed last night and woke with fever and body aches. Temp 102.0 (O).  Should she be concerned with fever of 102.  Treated with motrin. +sore throat, dry cough, +headache. Temp now down.   Reviewed Health History In EMR: Yes  Reviewed Medications In EMR: Yes  Reviewed Allergies In EMR: Yes  Reviewed Surgeries / Procedures: Yes  Date of Onset of Symptoms: 07/15/2013  Treatments Tried: hydromet , Motrin, decongestant  Treatments Tried Worked: Yes  Any Fever: Yes  Fever Taken: Oral  Fever Time Of Reading: 08:30:00  Fever Last Reading: 102.0 OB / GYN:  LMP: Unknown  Guideline(s) Used:  Influenza Follow-Up Call  Influenza Exposure  Influenza - Seasonal  Disposition Per Guideline:   Home Care  Reason For Disposition Reached:   Probable influenza with no complications and not HIGH RISK  Advice Given:  Treating the Symptoms of Flu  Fever, Muscle Aches, and Headache: For fever more than 101 F (38.3 C), muscle aches, and headaches, take acetaminophen every 4-6 hours (Adults 650 mg) OR ibuprofen every 6-8 hours (Adults 400-600 mg).  Sore Throat: Use throat lozenges, hard candy or warm chicken broth.  Cough: Use cough drops.  Hydrate:  Drink extra liquids. If the air in your home is dry, use a humidifier.  No Aspirin  : Do not use aspirin for treatment of fever or pain (Reason: there is an association between influenza and Reye syndrome).  Isolation is Needed Until After the Fever is Gone:   The CDC recommends that people with influenza-like illness remain at home until at least 24 hours after they are free of fever (100 F or 37.8C).  Do NOT go to work or school.  Do NOT go to church, child care centers, shopping, or other public places.  Do NOT shake hands.  Avoid close contact with others (hugging, kissing).  Expected Course  : The fever lasts 2-3 days, the runny nose 5-10 days, and the cough 2-3 weeks.  Call Back If:  Fever lasts more than 3 days  Runny nose lasts more than 10 days  Cough lasts more than 3 weeks  You become short of breath or worse.  Patient Will Follow Care Advice:  YES

## 2013-07-17 NOTE — Telephone Encounter (Signed)
Fever still expected at this stage. If she has any shortness of breath or worsening symptoms she really needs to be seen

## 2013-07-22 ENCOUNTER — Other Ambulatory Visit: Payer: Self-pay | Admitting: *Deleted

## 2013-07-22 MED ORDER — HYDROCHLOROTHIAZIDE 25 MG PO TABS: 25.0000 mg | ORAL_TABLET | Freq: Every day | ORAL | Status: DC

## 2013-07-22 MED ORDER — LISINOPRIL 10 MG PO TABS: 10.0000 mg | ORAL_TABLET | Freq: Every day | ORAL | Status: DC

## 2013-07-23 ENCOUNTER — Other Ambulatory Visit: Payer: Self-pay | Admitting: *Deleted

## 2013-07-23 MED ORDER — LISINOPRIL 10 MG PO TABS: 10.0000 mg | ORAL_TABLET | Freq: Every day | ORAL | Status: DC

## 2013-07-23 MED ORDER — HYDROCHLOROTHIAZIDE 25 MG PO TABS: 25.0000 mg | ORAL_TABLET | Freq: Every day | ORAL | Status: DC

## 2013-09-10 ENCOUNTER — Telehealth: Payer: Self-pay

## 2013-09-10 MED ORDER — HYDROCHLOROTHIAZIDE 25 MG PO TABS: 25.0000 mg | ORAL_TABLET | Freq: Every day | ORAL | Status: DC

## 2013-09-10 NOTE — Telephone Encounter (Signed)
Refill fixed amount

## 2013-09-11 ENCOUNTER — Other Ambulatory Visit: Payer: Self-pay

## 2013-09-11 MED ORDER — ALPRAZOLAM 0.25 MG PO TABS: 0.2500 mg | ORAL_TABLET | Freq: Three times a day (TID) | ORAL | Status: DC | PRN

## 2013-09-21 ENCOUNTER — Ambulatory Visit (INDEPENDENT_AMBULATORY_CARE_PROVIDER_SITE_OTHER): Payer: BC Managed Care – PPO | Admitting: Podiatry

## 2013-09-21 ENCOUNTER — Encounter: Payer: Self-pay | Admitting: Podiatry

## 2013-09-21 VITALS — BP 115/73 | HR 85 | Resp 16 | Ht 63.0 in | Wt 137.0 lb

## 2013-09-21 DIAGNOSIS — L259 Unspecified contact dermatitis, unspecified cause: Secondary | ICD-10-CM

## 2013-09-21 DIAGNOSIS — B351 Tinea unguium: Secondary | ICD-10-CM

## 2013-09-21 NOTE — Progress Notes (Signed)
   Subjective:    Patient ID: Molly Peterson, female    DOB: 1966-04-08, 48 y.o.   MRN: [id number]  HPI Comments: Toenails are brittle and the right great toe is extremely itchy. Feet are dry and cracked.      Review of Systems  Constitutional: Positive for fatigue.  Eyes: Positive for redness and itching.  Musculoskeletal: Positive for back pain.       Muscle and joint pain   Skin:       Change in nails   Allergic/Immunologic: Positive for food allergies.       Objective:   Physical Exam        Assessment & Plan:

## 2013-09-21 NOTE — Progress Notes (Signed)
Subjective:     Patient ID: [name], female   DOB: [birth year], 48 y.o.   MRN: [id number]  HPI patient presents stating I am concerned because I have some white spots on my big toenails that I do not remember having had previously. States they've been present for about 4 months   Review of Systems  All other systems reviewed and are negative.       Objective:   Physical Exam  Nursing note and vitals reviewed. Constitutional: She is oriented to person, place, and time.  Cardiovascular: Intact distal pulses.   Musculoskeletal: Normal range of motion.  Neurological: She is oriented to person, place, and time.  Skin: Skin is warm.   neurovascular status is intact with patient's muscle strength adequate and range of motion subtalar and midtarsal joint within normal limits. Patient is found to have slight dry skin on the heels of both feet and is found to have superficial discoloration of the hallux nails of both feet in the distal one third of the Batts     Assessment:     Probable superficial mycotic nail infection localized in nature    Plan:     Advised on topical formula 3 and higher grade nail polish with consideration for laser oral medication if symptoms persist

## 2013-12-31 ENCOUNTER — Telehealth: Payer: Self-pay | Admitting: Internal Medicine

## 2013-12-31 NOTE — Telephone Encounter (Signed)
**Note De-Identified Molly Peterson Obfuscation** The pt is advised that according to her lab results from 06/22/13 Dr Harrington Challenger recommended rechecking her lipids at the end of summer and that according to her last OV with Dr Harrington Challenger she should have OV in 1 year from 06/22/13. She verbalized understanding. I attempted to order and schedule Lipids but the pt states she will call back later in the summer to schedule.

## 2013-12-31 NOTE — Telephone Encounter (Signed)
New problem   Pt want to know if she need an appt with doctor or does she just need to get her cholesterol checked. Pt is confused about what she need to do.

## 2014-05-18 ENCOUNTER — Telehealth: Payer: Self-pay | Admitting: Internal Medicine

## 2014-05-18 DIAGNOSIS — E785 Hyperlipidemia, unspecified: Secondary | ICD-10-CM

## 2014-05-18 DIAGNOSIS — I10 Essential (primary) hypertension: Secondary | ICD-10-CM

## 2014-05-18 NOTE — Telephone Encounter (Signed)
New problem    Pt want to know does she need to have labs during her f/u visit with Dr Harrington Challenger. Please advise pt.

## 2014-05-19 NOTE — Telephone Encounter (Signed)
Staff message sent to Dr. Harrington Challenger to advise about labs prior to appointment in January.

## 2014-05-20 NOTE — Telephone Encounter (Signed)
Fay Records, MD  Rodman Key, RN            Should have BMET, Lipid panel (fasting) and CBC    Called patient to inform. She is unable to set up appointment today. (on [place] the airport) She will call back next week to set up lab appointment. She does not see Dr. Harrington Challenger until 07/12/14.

## 2014-06-29 ENCOUNTER — Telehealth: Payer: Self-pay | Admitting: Internal Medicine

## 2014-06-29 NOTE — Telephone Encounter (Signed)
New Message        Pt calling stating her toes continue to go completely white, when this happens she gets a chill through her whole toe, losing feeling in toes, and it is very painful. Pt states it began last week. Please call back and advise.

## 2014-06-29 NOTE — Telephone Encounter (Signed)
Left message for patient to call back  

## 2014-07-06 NOTE — Telephone Encounter (Signed)
Spoke with patient today. She reports no further neurovascular changes to her foot.  It had happened twice last week, was able to warm her foot quickly and symptoms resolved. Her feet are warm and normal color bilaterally currently.  She has appointment with Dr. Harrington Challenger coming up on Monday.

## 2014-07-08 ENCOUNTER — Encounter (HOSPITAL_COMMUNITY): Payer: Self-pay

## 2014-07-08 ENCOUNTER — Emergency Department (HOSPITAL_COMMUNITY)
Admission: EM | Admit: 2014-07-08 | Discharge: 2014-07-09 | Disposition: A | Discharge status: Home / Self Care | Payer: BLUE CROSS/BLUE SHIELD | Attending: Emergency Medicine | Admitting: Emergency Medicine

## 2014-07-08 DIAGNOSIS — X58XXXA Exposure to other specified factors, initial encounter: Secondary | ICD-10-CM | POA: Diagnosis not present

## 2014-07-08 DIAGNOSIS — Z8719 Personal history of other diseases of the digestive system: Secondary | ICD-10-CM | POA: Insufficient documentation

## 2014-07-08 DIAGNOSIS — Z79899 Other long term (current) drug therapy: Secondary | ICD-10-CM | POA: Diagnosis not present

## 2014-07-08 DIAGNOSIS — Z8709 Personal history of other diseases of the respiratory system: Secondary | ICD-10-CM | POA: Diagnosis not present

## 2014-07-08 DIAGNOSIS — Y998 Other external cause status: Secondary | ICD-10-CM | POA: Insufficient documentation

## 2014-07-08 DIAGNOSIS — Y9289 Other specified places as the place of occurrence of the external cause: Secondary | ICD-10-CM | POA: Insufficient documentation

## 2014-07-08 DIAGNOSIS — Y9389 Activity, other specified: Secondary | ICD-10-CM | POA: Diagnosis not present

## 2014-07-08 DIAGNOSIS — Z87891 Personal history of nicotine dependence: Secondary | ICD-10-CM | POA: Insufficient documentation

## 2014-07-08 DIAGNOSIS — I10 Essential (primary) hypertension: Secondary | ICD-10-CM | POA: Diagnosis not present

## 2014-07-08 DIAGNOSIS — Z872 Personal history of diseases of the skin and subcutaneous tissue: Secondary | ICD-10-CM | POA: Diagnosis not present

## 2014-07-08 DIAGNOSIS — R221 Localized swelling, mass and lump, neck: Secondary | ICD-10-CM | POA: Diagnosis present

## 2014-07-08 DIAGNOSIS — F329 Major depressive disorder, single episode, unspecified: Secondary | ICD-10-CM | POA: Diagnosis not present

## 2014-07-08 DIAGNOSIS — T7849XA Other allergy, initial encounter: Secondary | ICD-10-CM | POA: Diagnosis not present

## 2014-07-08 DIAGNOSIS — F419 Anxiety disorder, unspecified: Secondary | ICD-10-CM | POA: Insufficient documentation

## 2014-07-08 DIAGNOSIS — Z87440 Personal history of urinary (tract) infections: Secondary | ICD-10-CM | POA: Diagnosis not present

## 2014-07-08 DIAGNOSIS — T7840XA Allergy, unspecified, initial encounter: Secondary | ICD-10-CM

## 2014-07-08 MED ORDER — FAMOTIDINE 20 MG PO TABS
20.0000 mg | ORAL_TABLET | Freq: Once | ORAL | Status: AC
  Administered 2014-07-08: 20 mg via ORAL
  Filled 2014-07-08: qty 1

## 2014-07-08 MED ORDER — PREDNISONE 20 MG PO TABS
60.0000 mg | ORAL_TABLET | Freq: Once | ORAL | Status: AC
  Administered 2014-07-08: 60 mg via ORAL
  Filled 2014-07-08: qty 3

## 2014-07-08 NOTE — ED Notes (Signed)
Pt has been taking different cold medicines this week

## 2014-07-08 NOTE — ED Provider Notes (Signed)
CSN: [id number]     Arrival date & time 07/08/14  2251 History   First MD Initiated Contact with Patient 07/08/14 2318     Chief Complaint  Patient presents with  . Oral Swelling     (Consider location/radiation/quality/duration/timing/severity/associated sxs/prior Treatment) The history is provided by the patient and medical records. No language interpreter was used.     Molly Peterson is a 49 y.o. female  with a hx of anxiety, acute stress reaction, depression, HTN, UTI presents to the Emergency Department complaining of gradual, but rapidly progressing feeling of tongue swelling onset 1.5 hours PTA.  Pt reports she took a benadryl PTA and is beginning to feel better but continues to have associated throat itching. Pt reports she has had similar episodes in the past.  She reports she thinks she is allergic to pineapple and one time had a potential allergy to peanut. Patient reports that she was eating peanuts earlier in the evening and then eating tortilla chips when the symptoms began.   Patient denies rash, abdominal pain, nausea, vomiting, difficulty swallowing. She reports she's been able to drink without difficulty since the symptoms began. She denies difficulty breathing, wheezing, chest tightness, chest pain.   Patient reports associated 4 days of URI symptoms with sore throat, cough, rhinorrhea and general illness.   She reports she has been treating this with Delsym and over-the-counter cold medications for the last 4 days. No new medicines tonight. Patient is concerned that she might have strep throat as she was around several people last week who did.   Past Medical History  Diagnosis Date  . ANXIETY 09/18/2006  . REACTION, ACUTE STRESS W/EMOTIONAL DSTURB 12/23/2006  . DEPRESSION 09/18/2006  . HYPERTENSION 09/18/2006  . DERMATITIS, SEBORRHEIC NEC 01/06/2007  . Palpitations 09/18/2006  . Edema     symptom  . Acute pharyngitis   . Urinary tract infection, site not specified   .  Flatulence, eructation, and gas pain   . Unspecified constipation    Past Surgical History  Procedure Laterality Date  . Appendectomy  unknown  . Abdominal hysterectomy      partial  . Tonsillectomy  unknown  . Caesarean      x2   Family History  Problem Relation Age of Onset  . Heart disease Mother     PVC @age  47  . Heart disease Father     MI @ags  41 , CABG @55   . Hypertension Father    History  Substance Use Topics  . Smoking status: Former Smoker    Quit date: 06/18/2002  . Smokeless tobacco: Never Used  . Alcohol Use: Yes     Comment: occasionally   OB History    No data available     Review of Systems  Constitutional: Negative for fever, diaphoresis, appetite change, fatigue and unexpected weight change.  HENT: Positive for rhinorrhea, sinus pressure and sore throat. Negative for mouth sores.        Feeling of tongue swelling  Eyes: Negative for visual disturbance.  Respiratory: Positive for cough. Negative for chest tightness, shortness of breath and wheezing.   Cardiovascular: Negative for chest pain.  Gastrointestinal: Negative for nausea, vomiting, abdominal pain, diarrhea and constipation.  Endocrine: Negative for polydipsia, polyphagia and polyuria.  Genitourinary: Negative for dysuria, urgency, frequency and hematuria.  Musculoskeletal: Negative for back pain and neck stiffness.  Skin: Negative for rash.  Allergic/Immunologic: Negative for immunocompromised state.  Neurological: Negative for syncope, light-headedness and headaches.  Hematological: Does not  bruise/bleed easily.  Psychiatric/Behavioral: Negative for sleep disturbance. The patient is not nervous/anxious.       Allergies  Review of patient's allergies indicates no known allergies.  Home Medications   Prior to Admission medications   Medication Sig Start Date End Date Taking? Authorizing Provider  dextromethorphan (DELSYM) 30 MG/5ML liquid Take 60 mg by mouth at bedtime as needed  for cough.   Yes Historical Provider, MD  diphenhydrAMINE (BENADRYL) 25 MG tablet Take 25 mg by mouth every 6 (six) hours as needed for itching.   Yes Historical Provider, MD  EPINEPHrine (EPI-PEN) 0.3 mg/0.3 mL SOAJ Inject 0.3 mLs (0.3 mg total) into the muscle once as needed. 12/16/12  Yes Marletta Lor, MD  estradiol (ESTRACE) 0.1 MG/GM vaginal cream Place 1 Applicatorful vaginally at bedtime.   Yes Historical Provider, MD  fluticasone (FLONASE) 50 MCG/ACT nasal spray Place 2 sprays into the nose daily. As needed 08/04/12  Yes Timoteo Gaul, FNP  hydrochlorothiazide (HYDRODIURIL) 25 MG tablet Take 1 tablet (25 mg total) by mouth daily. 09/10/13  Yes Fay Records, MD  lisinopril (PRINIVIL,ZESTRIL) 10 MG tablet Take 1 tablet (10 mg total) by mouth daily. 07/23/13  Yes Lelon Perla, MD  Phenylephrine-DM-GG-APAP 5-10-200-325 MG TABS Take 1 tablet by mouth 2 (two) times daily.   Yes Historical Provider, MD  ALPRAZolam (XANAX) 0.25 MG tablet Take 1 tablet (0.25 mg total) by mouth every 8 (eight) hours as needed for sleep. Patient not taking: Reported on 07/09/2014 09/11/13   Fay Records, MD  benzonatate (TESSALON) 200 MG capsule Take 1 capsule (200 mg total) by mouth 3 (three) times daily as needed for cough. Patient not taking: Reported on 07/09/2014 07/15/13   Marletta Lor, MD  famotidine (PEPCID) 20 MG tablet Take 1 tablet (20 mg total) by mouth 2 (two) times daily. 07/09/14   Lourdez Mcgahan, PA-C  predniSONE (DELTASONE) 20 MG tablet Take 2 tablets (40 mg total) by mouth daily. 07/09/14   Makinzi Prieur, PA-C   BP 120/91 mmHg  Pulse 86  Temp(Src) 98.2 F (36.8 C) (Oral)  Resp 19  SpO2 99% Physical Exam  Constitutional: She is oriented to person, place, and time. She appears well-developed and well-nourished. No distress.  HENT:  Head: Normocephalic and atraumatic.  Right Ear: Tympanic membrane, external ear and ear canal normal.  Left Ear: Tympanic membrane, external  ear and ear canal normal.  Nose: Nose normal. No mucosal edema or rhinorrhea.  Mouth/Throat: Uvula is midline and oropharynx is clear and moist. No uvula swelling. No oropharyngeal exudate, posterior oropharyngeal edema, posterior oropharyngeal erythema or tonsillar abscesses.  No swelling of the uvula or oropharynx No visible swelling of the tongue   Eyes: Conjunctivae are normal.  Neck: Normal range of motion. Neck supple.  Patent airway No stridor; normal phonation Handling secretions without difficulty  Cardiovascular: Normal rate, normal heart sounds and intact distal pulses.   No murmur heard. Pulmonary/Chest: Effort normal and breath sounds normal. No stridor. No respiratory distress. She has no wheezes.  No wheezes or rhonchi  Abdominal: Soft. Bowel sounds are normal. There is no tenderness.  Musculoskeletal: Normal range of motion. She exhibits no edema.  Neurological: She is alert and oriented to person, place, and time.  Skin: Skin is warm and dry. No rash noted. She is not diaphoretic.  No rash  Psychiatric: She has a normal mood and affect.  Nursing note and vitals reviewed.   ED Course  Procedures (including critical care  time) Labs Review Labs Reviewed  RAPID STREP SCREEN  CULTURE, GROUP A STREP    Imaging Review No results found.   EKG Interpretation None      MDM   Final diagnoses:  Allergic reaction, initial encounter   Molly Peterson presents with complaints of throat swelling and tongue swelling. She denies difficulty breathing. No other signs of allergic reaction. No visible swelling of the oropharynx on exam patient reports she is feeling better. We'll begin treating like allergic reaction with Pepcid and prednisone.  Patient will be swabbed for strep throat.  12:34 AM Patient re-evaluated prior to dc, is hemodynamically stable, in no respiratory distress, and denies the feeling of throat closing. She reports that her symptoms have almost  completely abated. Her rapid strep screen is negative. Pt has been advised to take OTC benadryl & return to the ED if they have a mod-severe allergic rxn (s/s including throat closing, difficulty breathing, swelling of lips face or tongue). She will be discharged with Pepcid and prednisone.   Pt is to follow up with their PCP. Pt is agreeable with plan & verbalizes understanding.  Patient reports she has an EpiPen at home and will use this as necessary.  I have personally reviewed patient's vitals, nursing note and any pertinent labs or imaging.  I performed an undressed physical exam.    It has been determined that no acute conditions requiring further emergency intervention are present at this time. The patient/guardian have been advised of the diagnosis and plan. I reviewed all labs and imaging including any potential incidental findings. We have discussed signs and symptoms that warrant return to the ED and they are listed in the discharge instructions.    Vital signs are stable at discharge.   BP 120/91 mmHg  Pulse 86  Temp(Src) 98.2 F (36.8 C) (Oral)  Resp 19  SpO2 99%          Abigail Butts, PA-C 07/09/14 [place] III, MD 07/10/14 [id number]

## 2014-07-08 NOTE — ED Notes (Signed)
Pt noticed her tongue swelling about 8pm, she states that she ate a chip and it wouldn't go down, she says it felt like something was on the back of her tongue

## 2014-07-09 LAB — RAPID STREP SCREEN (MED CTR MEBANE ONLY): Streptococcus, Group A Screen (Direct): NEGATIVE

## 2014-07-09 MED ORDER — PREDNISONE 20 MG PO TABS: 40.0000 mg | ORAL_TABLET | Freq: Every day | ORAL | Status: DC

## 2014-07-09 MED ORDER — FAMOTIDINE 20 MG PO TABS: 20.0000 mg | ORAL_TABLET | Freq: Two times a day (BID) | ORAL | Status: DC

## 2014-07-09 NOTE — Discharge Instructions (Signed)
1. Medications: Prednisone, Benadryl 50mg  every 6 hours for the first 24 hours, Pepcid, usual home medications 2. Treatment: rest, drink plenty of fluids, take medications as prescribed 3. Follow Up: Please followup with your primary doctor in 3 days for discussion of your diagnoses and further evaluation after today's visit; if you do not have a primary care doctor use the resource guide provided to find one; followup with dermatology as needed; Return to the ER for difficulty breathing, return of allergic reaction or other concerning symptoms

## 2014-07-10 LAB — CULTURE, GROUP A STREP

## 2014-07-11 NOTE — Progress Notes (Addendum)
HPI Molly Peterson is a 49 yo who has a history of HTN, palpitations, Fhx of CAD  She was previously followed by [facility]   Her father did have coronary disease in his mid-50s but was a 3 pack-a-day smoker. She does not smoke. She has a history of CP in past. When lasat seen in 2015 had episode of chest tightness.  Atypical  . Since seen she has done fairly well  No signif CP SHe did have an episode a few wks ago where one toe started cramping , turned white then dusky  She put in warm water and it turned pink She does ay that at there times all toes can turn colors when cold  No problems with hands  Went to ER a few days ago when develped swelling , blistering of tongue  Given prednisone.   NKDA  Current Outpatient Prescriptions  Medication Sig Dispense Refill  . ALPRAZolam (XANAX) 0.25 MG tablet Take 1 tablet (0.25 mg total) by mouth every 8 (eight) hours as needed for sleep. (Patient not taking: Reported on 07/09/2014) 30 tablet 0  . benzonatate (TESSALON) 200 MG capsule Take 1 capsule (200 mg total) by mouth 3 (three) times daily as needed for cough. (Patient not taking: Reported on 07/09/2014) 30 capsule 0  . dextromethorphan (DELSYM) 30 MG/5ML liquid Take 60 mg by mouth at bedtime as needed for cough.    . diphenhydrAMINE (BENADRYL) 25 MG tablet Take 25 mg by mouth every 6 (six) hours as needed for itching.    Marland Kitchen EPINEPHrine (EPI-PEN) 0.3 mg/0.3 mL SOAJ Inject 0.3 mLs (0.3 mg total) into the muscle once as needed. 1 Device 2  . estradiol (ESTRACE) 0.1 MG/GM vaginal cream Place 1 Applicatorful vaginally at bedtime.    . famotidine (PEPCID) 20 MG tablet Take 1 tablet (20 mg total) by mouth 2 (two) times daily. 20 tablet 0  . fluticasone (FLONASE) 50 MCG/ACT nasal spray Place 2 sprays into the nose daily. As needed    . hydrochlorothiazide (HYDRODIURIL) 25 MG tablet Take 1 tablet (25 mg total) by mouth daily. 90 tablet 3  . lisinopril (PRINIVIL,ZESTRIL) 10 MG tablet Take 1 tablet (10 mg total)  by mouth daily. 90 tablet 3  . Phenylephrine-DM-GG-APAP 5-10-200-325 MG TABS Take 1 tablet by mouth 2 (two) times daily.    . predniSONE (DELTASONE) 20 MG tablet Take 2 tablets (40 mg total) by mouth daily. 10 tablet 0   No current facility-administered medications for this visit.    Past Medical History  Diagnosis Date  . ANXIETY 09/18/2006  . REACTION, ACUTE STRESS W/EMOTIONAL DSTURB 12/23/2006  . DEPRESSION 09/18/2006  . HYPERTENSION 09/18/2006  . DERMATITIS, SEBORRHEIC NEC 01/06/2007  . Palpitations 09/18/2006  . Edema     symptom  . Acute pharyngitis   . Urinary tract infection, site not specified   . Flatulence, eructation, and gas pain   . Unspecified constipation     Past Surgical History  Procedure Laterality Date  . Appendectomy  unknown  . Abdominal hysterectomy      partial  . Tonsillectomy  unknown  . Caesarean      x2    Family History  Problem Relation Age of Onset  . Heart disease Mother     PVC @age  65  . Heart disease Father     MI @ags  61 , CABG @55   . Hypertension Father     History   Social History  . Marital Status: Married    Spouse  Name: N/A    Number of Children: 2  . Years of Education: N/A   Occupational History  .     Social History Main Topics  . Smoking status: Former Smoker    Quit date: 06/18/2002  . Smokeless tobacco: Never Used  . Alcohol Use: Yes     Comment: occasionally  . Drug Use: No  . Sexual Activity: Not on file   Other Topics Concern  . Not on file   Social History Narrative    Review of Systems:  All systems reviewed.  They are negative to the above problem except as previously stated.   Vital Signs: BP 124/80  P 74  Wt 142  Physical Exam Patinet is in NAD HEENT:  Normocephalic, atraumatic. EOMI, PERRLA.  Neck: JVP is normal.  No bruits.  Lungs: clear to auscultation. No rales no wheezes.  Heart: Regular rate and rhythm. Normal S1, S2. No S3.   No significant murmurs. PMI not displaced.  Abdomen:  Supple,  nontender. Normal bowel sounds. No masses. No hepatomegaly.  Extremities:   Good distal pulses throughout. No lower extremity edema.  Back  No signif tenderness Musculoskeletal :moving all extremities. Good ROM of arms Neuro:   alert and oriented x3.  CN II-XII grossly intact.   Assessment and Plan:  1.  HTN  BP under good control  WIll check labss  2.  CHest tightness Patient denies  3.  ? Rayauds  Episode above sounds like vasospasm  WOuld follow  Keep fee warm.    4.  HCM WIll set up for fasting lipids

## 2014-07-12 ENCOUNTER — Ambulatory Visit (INDEPENDENT_AMBULATORY_CARE_PROVIDER_SITE_OTHER): Payer: BLUE CROSS/BLUE SHIELD | Admitting: Internal Medicine

## 2014-07-12 ENCOUNTER — Encounter: Payer: Self-pay | Admitting: Internal Medicine

## 2014-07-12 ENCOUNTER — Other Ambulatory Visit: Payer: Self-pay | Admitting: *Deleted

## 2014-07-12 VITALS — BP 112/80 | HR 91 | Ht 63.0 in | Wt 139.8 lb

## 2014-07-12 DIAGNOSIS — R002 Palpitations: Secondary | ICD-10-CM

## 2014-07-12 MED ORDER — ALPRAZOLAM 0.25 MG PO TABS
0.2500 mg | ORAL_TABLET | Freq: Three times a day (TID) | ORAL | Status: DC | PRN
Start: 2014-07-12 — End: 2015-05-17

## 2014-07-12 NOTE — Patient Instructions (Signed)
Your physician recommends that you continue on your current medications as directed. Please refer to the Current Medication list given to you today. Your physician recommends that you return for lab work. (CBC, BMP, LIPIDS) Your physician wants you to follow-up in: 1 year with Dr. Harrington Challenger.  You will receive a reminder letter in the mail two months in advance. If you don't receive a letter, please call our office to schedule the follow-up appointment.

## 2014-07-19 ENCOUNTER — Ambulatory Visit (INDEPENDENT_AMBULATORY_CARE_PROVIDER_SITE_OTHER): Payer: BLUE CROSS/BLUE SHIELD | Admitting: Family Medicine

## 2014-07-19 ENCOUNTER — Encounter: Payer: Self-pay | Admitting: Family Medicine

## 2014-07-19 VITALS — BP 120/80 | HR 94 | Temp 98.6°F | Wt 140.0 lb

## 2014-07-19 DIAGNOSIS — R208 Other disturbances of skin sensation: Secondary | ICD-10-CM

## 2014-07-19 DIAGNOSIS — R202 Paresthesia of skin: Secondary | ICD-10-CM

## 2014-07-19 NOTE — Progress Notes (Signed)
Subjective:    Patient ID: Molly Peterson, female    DOB: 1966/03/18, 49 y.o.   MRN: [id number]  HPI Patient is seen today as a work in for the following issues  She's had questionable history of food allergy possibly to pineapple and lobster in the past. She had recent ED visit following some dysesthesias of the tongue. Little over week ago she was eating some tortilla chips and felt like she had some possible blisters on her time and possible swelling of the time as well as intermittent lip numbness. She went to ED. No clear angioedema issues. She was prescribed Pepcid and apparently prednisone. She never apparently had any lip edema. She still has sensation of itching in the back of her throat but no clear swelling. No dyspnea. With recent event she had been eating some shrimp earlier today as well as some peanut butter but did not have any immediate symptoms following either one of those and she has never had problems those previously. She complains of some occasional numbness involving the tip of her tongue.  She has EpiPen at home. She recently took some Pepcid and Claritin. She has another issue of intermittent numbness involving left lateral foot mostly fourth and fifth toes. She notices this only if she has she wear her own of any type. She is not noted this with anyone particular shoe. Denies a back pain. No radiculopathy symptoms. Denies any other extremity numbness or visual changes or focal weakness.  Past Medical History  Diagnosis Date  . ANXIETY 09/18/2006  . REACTION, ACUTE STRESS W/EMOTIONAL DSTURB 12/23/2006  . DEPRESSION 09/18/2006  . HYPERTENSION 09/18/2006  . DERMATITIS, SEBORRHEIC NEC 01/06/2007  . Palpitations 09/18/2006  . Edema     symptom  . Acute pharyngitis   . Urinary tract infection, site not specified   . Flatulence, eructation, and gas pain   . Unspecified constipation    Past Surgical History  Procedure Laterality Date  . Appendectomy  unknown  . Abdominal  hysterectomy      partial  . Tonsillectomy  unknown  . Caesarean      x2    reports that she quit smoking about 12 years ago. She has never used smokeless tobacco. She reports that she drinks alcohol. She reports that she does not use illicit drugs. family history includes Heart disease in her father and mother; Hypertension in her father. No Known Allergies    Review of Systems  Constitutional: Negative for fever and chills.  HENT: Negative for congestion, sinus pressure, sore throat, tinnitus, trouble swallowing and voice change.   Respiratory: Negative for cough and shortness of breath.   Cardiovascular: Negative for chest pain, palpitations and leg swelling.  Skin: Negative for rash.  Hematological: Negative for adenopathy.       Objective:   Physical Exam  Constitutional: She appears well-developed and well-nourished.  HENT:  Head: Normocephalic and atraumatic.  Right Ear: External ear normal.  Left Ear: External ear normal.  Mouth/Throat: Oropharynx is clear and moist.  Oropharyngeal exam is completely normal. No lip edema. Tongue appears normal. No evidence for any ulcerations. She's had previous bilateral tonsillectomy. Posterior pharynx is clear. No thrush.  Neck: Neck supple.  Cardiovascular: Normal rate and regular rhythm.   Pulmonary/Chest: Effort normal and breath sounds normal. No respiratory distress. She has no wheezes. She has no rales.  Musculoskeletal: She exhibits no edema.  Lymphadenopathy:    She has no cervical adenopathy.  Neurological:  Full strength lower  extremities. Symmetric reflexes knee and ankle bilaterally.  Skin: No rash noted.          Assessment & Plan:  #1 vague intraoral symptoms of dysesthesias. She does not describe any clear angioedema type symptoms. Add B12 and TSH with upcoming labs. She is encouraged to keep a food diary and if she has any symptoms that correlate with any specific foods to follow-up immediately with allergist.  She has EpiPen at home but again recent symptoms do not sound suspicious for angioedema in any way. #2 intermittent paresthesias left lateral foot. This only occurs with shoewear. Likely local compression of peripheral nerve. Follow-up promptly for any lower extremity weakness or progressive numbness. Labs as above. Consider neurology evaluation for any progressive symptoms

## 2014-07-19 NOTE — Progress Notes (Signed)
Pre visit review using our clinic review tool, if applicable. No additional management support is needed unless otherwise documented below in the visit note. 

## 2014-07-19 NOTE — Patient Instructions (Signed)

## 2014-07-21 ENCOUNTER — Other Ambulatory Visit (INDEPENDENT_AMBULATORY_CARE_PROVIDER_SITE_OTHER): Payer: BLUE CROSS/BLUE SHIELD | Admitting: *Deleted

## 2014-07-21 DIAGNOSIS — I10 Essential (primary) hypertension: Secondary | ICD-10-CM

## 2014-07-21 DIAGNOSIS — E785 Hyperlipidemia, unspecified: Secondary | ICD-10-CM

## 2014-07-21 DIAGNOSIS — R202 Paresthesia of skin: Secondary | ICD-10-CM

## 2014-07-21 LAB — TSH: TSH: 0.9 u[IU]/mL (ref 0.35–4.50)

## 2014-07-21 LAB — BASIC METABOLIC PANEL
BUN: 20 mg/dL (ref 6–23)
CO2: 32 mEq/L (ref 19–32)
Calcium: 9.6 mg/dL (ref 8.4–10.5)
Chloride: 99 mEq/L (ref 96–112)
Creatinine, Ser: 0.92 mg/dL (ref 0.40–1.20)
GFR: 69.07 mL/min (ref >60.00)
GLUCOSE: 89 mg/dL (ref 70–99)
Potassium: 4 mEq/L (ref 3.5–5.1)
Sodium: 138 mEq/L (ref 135–145)

## 2014-07-21 LAB — LIPID PANEL
CHOL/HDL RATIO: 6
Cholesterol: 242 mg/dL — ABNORMAL HIGH (ref 0–200)
HDL: 42.9 mg/dL (ref >39.00)
NONHDL: 199.1
TRIGLYCERIDES: 266 mg/dL — AB (ref 0.0–149.0)
VLDL: 53.2 mg/dL — ABNORMAL HIGH (ref 0.0–40.0)

## 2014-07-21 LAB — VITAMIN B12: Vitamin B-12: 293 pg/mL (ref 211–911)

## 2014-07-21 LAB — LDL CHOLESTEROL, DIRECT: Direct LDL: 140 mg/dL

## 2014-07-21 NOTE — Addendum Note (Signed)
Addended by: Eulis Foster on: 07/21/2014 08:49 AM   Modules accepted: Orders

## 2014-07-21 NOTE — Addendum Note (Signed)
Addended by: Eulis Foster on: 07/21/2014 08:39 AM   Modules accepted: Orders

## 2014-07-23 ENCOUNTER — Telehealth: Payer: Self-pay | Admitting: Internal Medicine

## 2014-07-23 DIAGNOSIS — E785 Hyperlipidemia, unspecified: Secondary | ICD-10-CM

## 2014-07-23 NOTE — Telephone Encounter (Signed)
New problem   Pt want to know results of labs. Please call pt.

## 2014-07-27 NOTE — Telephone Encounter (Signed)
F/U         Pt calling to get lab results.    Please return call.

## 2014-07-27 NOTE — Telephone Encounter (Signed)
Reviewed lipids with patient Pleas female flow sheed of lipids over past few years to her I recomm diet modificaitons (decreased carbs, watch fats) F/U lipid panel next fall, winter (? November/December) F/U appt January

## 2014-07-29 NOTE — Telephone Encounter (Signed)
Copy of labs mailed to the patient- address confirmed.  Recall in for January with Dr. Harrington Challenger. Recall for labs for November

## 2014-08-12 ENCOUNTER — Other Ambulatory Visit: Payer: Self-pay

## 2014-08-12 DIAGNOSIS — Z1231 Encounter for screening mammogram for malignant neoplasm of breast: Secondary | ICD-10-CM

## 2014-08-16 ENCOUNTER — Ambulatory Visit
Admission: RE | Admit: 2014-08-16 | Discharge: 2014-08-16 | Disposition: A | Discharge status: Home / Self Care | Payer: BLUE CROSS/BLUE SHIELD | Source: Ambulatory Visit

## 2014-08-16 DIAGNOSIS — Z1231 Encounter for screening mammogram for malignant neoplasm of breast: Secondary | ICD-10-CM

## 2014-09-06 ENCOUNTER — Telehealth: Payer: Self-pay | Admitting: Internal Medicine

## 2014-09-06 MED ORDER — LISINOPRIL 10 MG PO TABS: 10.0000 mg | ORAL_TABLET | Freq: Every day | ORAL | Status: DC

## 2014-09-06 NOTE — Telephone Encounter (Signed)
Refilled lisinopril to [facility] for 90 day supply. Left message on voicemail that I sent prescription as she requested; and to please call back if any other concerns.

## 2014-09-06 NOTE — Telephone Encounter (Signed)
New Msg       Pt c/o medication issue:  1. Name of Medication: Lisinopril   2. How are you currently taking this medication (dosage and times per day)? 10 mg, once a day 3. Are you having a reaction (difficulty breathing--STAT)? No   4. What is your medication issue? Pt is completely out of medication and will a new prescription due to last one expiring.  Pt needs new prescription sent to [facility] instead of [facility].  Please return pt call.

## 2014-09-20 ENCOUNTER — Other Ambulatory Visit: Payer: Self-pay | Admitting: *Deleted

## 2014-09-20 MED ORDER — LISINOPRIL 10 MG PO TABS: 10.0000 mg | ORAL_TABLET | Freq: Every day | ORAL | Status: DC

## 2014-10-15 ENCOUNTER — Ambulatory Visit (INDEPENDENT_AMBULATORY_CARE_PROVIDER_SITE_OTHER): Payer: BLUE CROSS/BLUE SHIELD | Admitting: Podiatry

## 2014-10-15 ENCOUNTER — Encounter: Payer: Self-pay | Admitting: Podiatry

## 2014-10-15 VITALS — BP 114/74 | HR 69 | Resp 12

## 2014-10-15 DIAGNOSIS — B351 Tinea unguium: Secondary | ICD-10-CM | POA: Diagnosis not present

## 2014-10-15 MED ORDER — TERBINAFINE HCL 250 MG PO TABS: ORAL_TABLET | ORAL | Status: DC

## 2014-10-15 NOTE — Progress Notes (Signed)
Subjective:     Patient ID: Molly Peterson, female   DOB: May 22, 1966, 49 y.o.   MRN: [id number]  HPI patient presents stating I have discoloration of my right big toenail and I know that it's given need more than what was done before. It took 10 months it started to improve and that it started again   Review of Systems     Objective:   Physical Exam Neurovascular status intact muscle strength adequate with discoloration of the distal one third of the hallux nail right with yellow like brittle type appearance and crumbled type surface    Assessment:     Mycotic nail infection distal one third    Plan:     Reviewed condition and recommended laser treatment for this along with pulse Lamisil treatment and topical formulas 3. Reviewed all 3 treatments and how we will do them and she is scheduled for procedure

## 2014-10-19 ENCOUNTER — Other Ambulatory Visit: Payer: Self-pay

## 2014-10-19 MED ORDER — HYDROCHLOROTHIAZIDE 25 MG PO TABS: 25.0000 mg | ORAL_TABLET | Freq: Every day | ORAL | Status: DC

## 2015-02-01 ENCOUNTER — Other Ambulatory Visit: Payer: Self-pay | Admitting: Obstetrics and Gynecology

## 2015-02-02 LAB — CYTOLOGY - PAP

## 2015-05-17 ENCOUNTER — Telehealth: Payer: Self-pay | Admitting: *Deleted

## 2015-05-17 MED ORDER — ALPRAZOLAM 0.25 MG PO TABS: 0.2500 mg | ORAL_TABLET | Freq: Three times a day (TID) | ORAL | Status: DC | PRN

## 2015-05-17 NOTE — Telephone Encounter (Signed)
returned call regarding her xanax, wants a refill, will send to Dr. Albesa Seen for advisement.

## 2015-05-17 NOTE — Telephone Encounter (Signed)
Spoke with Dr. Harrington Challenger, ok to fill 40 tablets, no refills; pt has appointment in December with Dr. Harrington Challenger. Called the prescription to [facility] [phone].

## 2015-05-30 ENCOUNTER — Encounter: Payer: Self-pay | Admitting: Internal Medicine

## 2015-05-30 ENCOUNTER — Ambulatory Visit (INDEPENDENT_AMBULATORY_CARE_PROVIDER_SITE_OTHER): Payer: BLUE CROSS/BLUE SHIELD | Admitting: Internal Medicine

## 2015-05-30 VITALS — BP 120/84 | HR 73 | Ht 63.0 in | Wt 139.1 lb

## 2015-05-30 DIAGNOSIS — R0989 Other specified symptoms and signs involving the circulatory and respiratory systems: Secondary | ICD-10-CM

## 2015-05-30 NOTE — Progress Notes (Signed)
Cardiology Office Note   Date:  05/30/2015   ID:  [name], DOB 1965-08-16, MRN [id number]  PCP:  No PCP Per Patient  Cardiologist:   Dorris Carnes, MD   F/U of HTN    History of Present Illness: Molly Peterson is a 49 y.o. female with a history of HTN, palpitations Question raynauds   She has a FHx of CAD (father dx in 44s  He was a smoker) I saw her back in Jan  Lipid panel in Feb total cholesterol was 242  Trig 266  HDL 43  LDL 140    Pt denies palpitations  Breathing gets short with stairs and when walking fast  Not when on treadmill  Had allergic Rxn to something she ate last night  SOme swelling tongue  Took benadryl     Current Outpatient Prescriptions  Medication Sig Dispense Refill  . ALPRAZolam (XANAX) 0.25 MG tablet Take 1 tablet (0.25 mg total) by mouth 3 (three) times daily as needed for anxiety. 40 tablet 0  . diphenhydrAMINE (BENADRYL) 25 MG tablet Take 25 mg by mouth every 6 (six) hours as needed for itching.    Marland Kitchen EPINEPHrine (EPI-PEN) 0.3 mg/0.3 mL SOAJ Inject 0.3 mLs (0.3 mg total) into the muscle once as needed. 1 Device 2  . estradiol (ESTRACE) 2 MG tablet Take 2 mg by mouth daily.     . famotidine (PEPCID) 20 MG tablet Take 20 mg by mouth daily as needed (for allergic reaction).    . fluticasone (FLONASE) 50 MCG/ACT nasal spray Place 2 sprays into the nose daily as needed for allergies or rhinitis.     . hydrochlorothiazide (HYDRODIURIL) 25 MG tablet Take 1 tablet (25 mg total) by mouth daily. 90 tablet 3  . lisinopril (PRINIVIL,ZESTRIL) 10 MG tablet Take 1 tablet (10 mg total) by mouth daily. 90 tablet 3   No current facility-administered medications for this visit.    Allergies:   Review of patient's allergies indicates no known allergies.   Past Medical History  Diagnosis Date  . ANXIETY 09/18/2006  . REACTION, ACUTE STRESS W/EMOTIONAL DSTURB 12/23/2006  . DEPRESSION 09/18/2006  . HYPERTENSION 09/18/2006  . DERMATITIS, SEBORRHEIC NEC 01/06/2007    . Palpitations 09/18/2006  . Edema     symptom  . Acute pharyngitis   . Urinary tract infection, site not specified   . Flatulence, eructation, and gas pain   . Unspecified constipation     Past Surgical History  Procedure Laterality Date  . Appendectomy  unknown  . Abdominal hysterectomy      partial  . Tonsillectomy  unknown  . Caesarean      x2     Social History:  The patient  reports that she quit smoking about 12 years ago. She has never used smokeless tobacco. She reports that she drinks alcohol. She reports that she does not use illicit drugs.   Family History:  The patient's family history includes Heart disease in her father and mother; Hypertension in her father.    ROS:  Please see the history of present illness. All other systems are reviewed and  Negative to the above problem except as noted.    PHYSICAL EXAM: VS:  BP 120/84 mmHg  Pulse 73  Ht 5\' 3"  (1.6 m)  Wt 63.104 kg (139 lb 1.9 oz)  BMI 24.65 kg/m2  GEN: Well nourished, well developed, in no acute distress HEENT: normal Neck: no JVD, ? R carotid bruit  No masses  Cardiac: RRR; no murmurs, rubs, or gallops,no edema  Respiratory:  clear to auscultation bilaterally, normal work of breathing GI: soft, nontender, nondistended, + BS  No hepatomegaly  MS: no deformity Moving all extremities   Skin: warm and dry, no rash Neuro:  Strength and sensation are intact Psych: euthymic mood, full affect   EKG:  EKG isnot  ordered today.   Lipid Panel    Component Value Date/Time   CHOL 242* 07/21/2014 0839   TRIG 266.0* 07/21/2014 0839   HDL 42.90 07/21/2014 0839   CHOLHDL 6 07/21/2014 0839   VLDL 53.2* 07/21/2014 0839   LDLDIRECT 140.0 07/21/2014 0839      Wt Readings from Last 3 Encounters:  05/30/15 63.104 kg (139 lb 1.9 oz)  07/19/14 63.504 kg (140 lb)  07/12/14 63.413 kg (139 lb 12.8 oz)      ASSESSMENT AND PLAN:  1  HTN  BP is adequately controlled  2.  Palpitations  Pt denies   3.  ?  Bruit  R carotid  Check carotid USN esp with family Hx of CAD  Further Rx based on test resutls    4  Dyspnea.  Unclear  Able to walk on treadmill OK  Follow  N     Signed, Dorris Carnes, MD  05/30/2015 3:30 PM    [facility] [address], [place], [place]  [address] Phone: [phone]; Fax: [phone]

## 2015-05-30 NOTE — Patient Instructions (Signed)
Your physician recommends that you continue on your current medications as directed. Please refer to the Current Medication list given to you today.  Your physician has requested that you have a carotid duplex. This test is an ultrasound of the carotid arteries in your neck. It looks at blood flow through these arteries that supply the brain with blood. Allow one hour for this exam. There are no restrictions or special instructions.  Your physician wants you to follow-up in: 1 year with Dr. Ross.  You will receive a reminder letter in the mail two months in advance. If you don't receive a letter, please call our office to schedule the follow-up appointment.    

## 2015-06-06 ENCOUNTER — Ambulatory Visit (HOSPITAL_COMMUNITY)
Admission: RE | Admit: 2015-06-06 | Discharge: 2015-06-06 | Disposition: A | Discharge status: Home / Self Care | Payer: BLUE CROSS/BLUE SHIELD | Source: Ambulatory Visit | Attending: Internal Medicine | Admitting: Internal Medicine

## 2015-06-06 DIAGNOSIS — I6523 Occlusion and stenosis of bilateral carotid arteries: Secondary | ICD-10-CM | POA: Diagnosis not present

## 2015-06-06 DIAGNOSIS — R0989 Other specified symptoms and signs involving the circulatory and respiratory systems: Secondary | ICD-10-CM | POA: Insufficient documentation

## 2015-06-06 DIAGNOSIS — I10 Essential (primary) hypertension: Secondary | ICD-10-CM | POA: Diagnosis not present

## 2015-06-14 ENCOUNTER — Telehealth: Payer: Self-pay

## 2015-06-14 MED ORDER — EZETIMIBE 10 MG PO TABS: 10.0000 mg | ORAL_TABLET | Freq: Every day | ORAL | Status: DC

## 2015-06-14 NOTE — Telephone Encounter (Signed)
Prior auth for Zetia 10mg  sent to [facility]

## 2015-06-17 ENCOUNTER — Telehealth: Payer: Self-pay

## 2015-06-17 NOTE — Telephone Encounter (Signed)
Generic Zetia denied by [facility]. Do you want to try anything else?

## 2015-06-19 HISTORY — PX: COLONOSCOPY: SHX174

## 2015-06-19 HISTORY — PX: POLYPECTOMY: SHX149

## 2015-08-06 NOTE — Telephone Encounter (Signed)
Spoke to pt Will review pricing of Zetia this summer  Should come down

## 2015-09-14 ENCOUNTER — Ambulatory Visit: Payer: BLUE CROSS/BLUE SHIELD | Admitting: Gastroenterology

## 2015-10-08 ENCOUNTER — Other Ambulatory Visit: Payer: Self-pay | Admitting: Internal Medicine

## 2015-11-24 ENCOUNTER — Other Ambulatory Visit: Payer: Self-pay | Admitting: Internal Medicine

## 2016-01-05 ENCOUNTER — Other Ambulatory Visit: Payer: Self-pay | Admitting: Obstetrics and Gynecology

## 2016-01-05 DIAGNOSIS — Z139 Encounter for screening, unspecified: Secondary | ICD-10-CM

## 2016-01-09 ENCOUNTER — Telehealth: Payer: Self-pay | Admitting: Internal Medicine

## 2016-01-09 NOTE — Telephone Encounter (Signed)
New message      Pt c/o BP issue: STAT if pt c/o blurred vision, one-sided weakness or slurred speech  1. What are your last 5 BP readings? 97/63 yesterday am, 107-77 later yesterday, 113/78 this am 2. Are you having any other symptoms (ex. Dizziness, headache, blurred vision, passed out)? Lightheaded, dizziness 3. What is your BP issue? Pt states her top number has never been this low.  Pt did not take her bp pill and diuretic yesterday or today.  Please call

## 2016-01-09 NOTE — Telephone Encounter (Signed)
Reports having low blood pressures over the weekend.  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  When stepping on and off she would experience "swimmy feeling".  Felt like an inner ear issue but states she isn't sure, described it as a "cotton type itchy feeling in my ear".  BP was in low 100s/60. States that on Sunday it was low again.  Question was raised about possible dehydration.  She increased her fluid intake in the morning and did not feel as lightheaded. But by afternoon feeling began again.   She explains that normally she drinks a lot of water but was working hard this weekend and may not have drank enough being busy with house work.  (does deny sweating/being out in the heat while working) This morning her pressure is normal at 120/78, HR 65.  States this is normal BP for her. She also tells me that this occurred about a year/two ago and her BP med was stopped because of this.  However she began to have increased BPs after med d/c and had to restart secondary to HTN.  She has not taken her diuretic or blood pressure medication this morning.  Patient advised to take normal dose of diuretic and take 1/2 normal dose of Lisinopril today.  Will forward to Dr. Harrington Challenger to review and advise on.  We discussed possibly reducing Lisinopril dose if low BPs continue. Patient advised to continue to monitor and call office if she continues to experience symptoms before hearing back from [place] with recommendations. Patient verbalized understanding and agreeable to plan.

## 2016-01-09 NOTE — Telephone Encounter (Signed)
Called pt  She is feeling better  Took 1/2 today I told her to follow BP   Take 1/2 dose meds tomorrow  Follow  Advance later if tolerates Call with problems Stay hydrated.

## 2016-01-11 ENCOUNTER — Ambulatory Visit
Admission: RE | Admit: 2016-01-11 | Discharge: 2016-01-11 | Disposition: A | Discharge status: Home / Self Care | Payer: BLUE CROSS/BLUE SHIELD | Source: Ambulatory Visit | Attending: Obstetrics and Gynecology | Admitting: Obstetrics and Gynecology

## 2016-01-11 DIAGNOSIS — Z139 Encounter for screening, unspecified: Secondary | ICD-10-CM

## 2016-01-11 DIAGNOSIS — Z1231 Encounter for screening mammogram for malignant neoplasm of breast: Secondary | ICD-10-CM | POA: Diagnosis not present

## 2016-01-31 DIAGNOSIS — M26629 Arthralgia of temporomandibular joint, unspecified side: Secondary | ICD-10-CM | POA: Insufficient documentation

## 2016-01-31 DIAGNOSIS — H9012 Conductive hearing loss, unilateral, left ear, with unrestricted hearing on the contralateral side: Secondary | ICD-10-CM | POA: Diagnosis not present

## 2016-01-31 DIAGNOSIS — H938X2 Other specified disorders of left ear: Secondary | ICD-10-CM | POA: Diagnosis not present

## 2016-01-31 DIAGNOSIS — H93293 Other abnormal auditory perceptions, bilateral: Secondary | ICD-10-CM | POA: Insufficient documentation

## 2016-01-31 DIAGNOSIS — I10 Essential (primary) hypertension: Secondary | ICD-10-CM | POA: Insufficient documentation

## 2016-02-01 ENCOUNTER — Telehealth: Payer: Self-pay | Admitting: Internal Medicine

## 2016-02-01 MED ORDER — EZETIMIBE 10 MG PO TABS: 10.0000 mg | ORAL_TABLET | Freq: Every day | ORAL | 3 refills | Status: DC

## 2016-02-01 NOTE — Telephone Encounter (Signed)
Calling requesting Rx for Zetia be sent into [facility].  She was prescribed Zetia in 2016 but insurance wouldn't pay for it so Rx was never sent into pharmacy.  Will send to [facility]. She also had concern that her HR has been elevated.  She was in ENT office yesterday and they took her BP,HR with a wrist unit and was told HR was 98.  She states she felt fine.  Has noticed at home when she takes her BP and HR couple of times a week has been 96-97 range. She states she doesn't feel palpitations and feels fine.  Advised to check BP and HR daily for awhile and if continues to be elevated or is symptomatic to call us back.  States she is concerned since her mother has AFib.  She also verbalizes that she is under stress with 2 teenagers.

## 2016-02-01 NOTE — Telephone Encounter (Signed)
Routed to triage as patient does not have a device.

## 2016-02-01 NOTE — Telephone Encounter (Signed)
New message      Pt states that insurance would not cover med that was called in for cholesterol. Pt would like to know the name of the med? Please send the prescription back to the pharmacy states the pt. Pt states that her heart rate was really high.     Patient c/o Palpitations:  High priority if patient c/o lightheadedness and shortness of breath.  1. How long have you been having palpitations? For a long time  2. Are you currently experiencing lightheadedness and shortness of breath? Last month was experiencing lightheadedness   3. Have you checked your BP and heart rate? (document readings) 120/81/98 yesterday, today 124/83/71  4. Are you experiencing any other symptoms? No other symptoms

## 2016-02-02 ENCOUNTER — Telehealth: Payer: Self-pay

## 2016-02-02 DIAGNOSIS — Z01419 Encounter for gynecological examination (general) (routine) without abnormal findings: Secondary | ICD-10-CM | POA: Diagnosis not present

## 2016-02-02 DIAGNOSIS — Z6824 Body mass index (BMI) 24.0-24.9, adult: Secondary | ICD-10-CM | POA: Diagnosis not present

## 2016-02-02 DIAGNOSIS — R609 Edema, unspecified: Secondary | ICD-10-CM | POA: Diagnosis not present

## 2016-02-02 DIAGNOSIS — Z01411 Encounter for gynecological examination (general) (routine) with abnormal findings: Secondary | ICD-10-CM | POA: Diagnosis not present

## 2016-02-02 DIAGNOSIS — N952 Postmenopausal atrophic vaginitis: Secondary | ICD-10-CM | POA: Diagnosis not present

## 2016-02-02 NOTE — Telephone Encounter (Signed)
Prior auth for Ezetimibe 10mg  submitted to [facility].

## 2016-02-09 ENCOUNTER — Telehealth: Payer: Self-pay

## 2016-02-09 NOTE — Telephone Encounter (Signed)
Ezetimibe denied by [facility]. She must try at least 2 other statins. Dr. Harrington Challenger aware.

## 2016-04-06 ENCOUNTER — Encounter: Payer: Self-pay | Admitting: Gastroenterology

## 2016-04-26 ENCOUNTER — Ambulatory Visit: Payer: BLUE CROSS/BLUE SHIELD | Admitting: *Deleted

## 2016-04-26 VITALS — Ht 63.0 in | Wt 139.0 lb

## 2016-04-26 DIAGNOSIS — Z1211 Encounter for screening for malignant neoplasm of colon: Secondary | ICD-10-CM

## 2016-04-26 MED ORDER — NA SULFATE-K SULFATE-MG SULF 17.5-3.13-1.6 GM/177ML PO SOLN
1.0000 | Freq: Once | ORAL | 0 refills | Status: AC
Start: 2016-04-26 — End: 2016-04-26

## 2016-04-26 NOTE — Progress Notes (Signed)
Denies allergies to eggs or soy products. Denies complications with sedation or anesthesia. Denies O2 use. Denies use of diet or weight loss medications.  Emmi instructions given for colonoscopy.  

## 2016-05-15 ENCOUNTER — Ambulatory Visit (AMBULATORY_SURGERY_CENTER): Payer: BLUE CROSS/BLUE SHIELD | Admitting: Gastroenterology

## 2016-05-15 ENCOUNTER — Encounter: Payer: Self-pay | Admitting: Gastroenterology

## 2016-05-15 VITALS — BP 112/76 | HR 70 | Temp 98.4°F | Resp 16 | Ht 63.0 in | Wt 139.0 lb

## 2016-05-15 DIAGNOSIS — D122 Benign neoplasm of ascending colon: Secondary | ICD-10-CM | POA: Diagnosis not present

## 2016-05-15 DIAGNOSIS — Z1211 Encounter for screening for malignant neoplasm of colon: Secondary | ICD-10-CM

## 2016-05-15 DIAGNOSIS — Z1212 Encounter for screening for malignant neoplasm of rectum: Secondary | ICD-10-CM

## 2016-05-15 MED ORDER — SODIUM CHLORIDE 0.9 % IV SOLN: 500.0000 mL | INTRAVENOUS | Status: DC

## 2016-05-15 NOTE — Op Note (Signed)
[facility] Patient Name: Molly Peterson Procedure Date: 05/15/2016 2:31 PM MRN: [id number] Endoscopist: Ladene Artist , MD Age: 50 Referring MD:  Date of Birth: 1966/04/26 Gender: Female Account #: [id number] Procedure:                Colonoscopy Indications:              Screening for colorectal malignant neoplasm, This                            is the patient's first colonoscopy Medicines:                Monitored Anesthesia Care Procedure:                Pre-Anesthesia Assessment:                           - Prior to the procedure, a History and Physical                            was performed, and patient medications and                            allergies were reviewed. The patient's tolerance of                            previous anesthesia was also reviewed. The risks                            and benefits of the procedure and the sedation                            options and risks were discussed with the patient.                            All questions were answered, and informed consent                            was obtained. Prior Anticoagulants: The patient has                            taken no previous anticoagulant or antiplatelet                            agents. ASA Grade Assessment: II - A patient with                            mild systemic disease. After reviewing the risks                            and benefits, the patient was deemed in                            satisfactory condition to undergo the procedure.  After obtaining informed consent, the colonoscope                            was passed under direct vision. Throughout the                            procedure, the patient's blood pressure, pulse, and                            oxygen saturations were monitored continuously. The                            Model PCF-H190DL [id number]) scope was introduced                            through the anus and  advanced to the the cecum,                            identified by appendiceal orifice and ileocecal                            valve. The ileocecal valve, appendiceal orifice,                            and rectum were photographed. The quality of the                            bowel preparation was good. The colonoscopy was                            performed without difficulty. The patient tolerated                            the procedure well. Scope In: 2:41:04 PM Scope Out: 2:53:14 PM Scope Withdrawal Time: 0 hours 10 minutes 36 seconds  Total Procedure Duration: 0 hours 12 minutes 10 seconds  Findings:                 The perianal and digital rectal examinations                            revealed moderate external hemorrhoids.                           A 7 mm polyp was found in the ascending colon. The                            polyp was sessile. The polyp was removed with a                            cold snare. Resection and retrieval were complete.                           External and internal hemorrhoids were found during  retroflexion. The internal hemorrhoids were Grade I                            (internal hemorrhoids that do not prolapse) and                            small.                           The exam was otherwise without abnormality on                            direct and retroflexion views. Complications:            No immediate complications. Estimated blood loss:                            None. Estimated Blood Loss:     Estimated blood loss: none. Impression:               - One 7 mm polyp in the transverse colon, removed                            with a cold snare. Resected and retrieved.                           - External and internal hemorrhoids.                           - The examination was otherwise normal on direct                            and retroflexion views. Recommendation:           - Repeat colonoscopy  in 5 years for surveillance if                            polyp is precancerous, otherwise 10 years.                           - Patient has a contact number available for                            emergencies. The signs and symptoms of potential                            delayed complications were discussed with the                            patient. Return to normal activities tomorrow.                            Written discharge instructions were provided to the                            patient.                           -  Resume previous diet.                           - Continue present medications.                           - Await pathology results. Ladene Artist, MD 05/15/2016 2:57:42 PM This report has been signed electronically.

## 2016-05-15 NOTE — Patient Instructions (Signed)
YOU HAD AN ENDOSCOPIC PROCEDURE TODAY AT [facility]:   Refer to the procedure report that was given to you for any specific questions about what was found during the examination.  If the procedure report does not answer your questions, please call your gastroenterologist to clarify.  If you requested that your care partner not be given the details of your procedure findings, then the procedure report has been included in a sealed envelope for you to review at your convenience later.  YOU SHOULD EXPECT: Some feelings of bloating in the abdomen. Passage of more gas than usual.  Walking can help get rid of the air that was put into your GI tract during the procedure and reduce the bloating. If you had a lower endoscopy (such as a colonoscopy or flexible sigmoidoscopy) you may notice spotting of blood in your stool or on the toilet paper. If you underwent a bowel prep for your procedure, you may not have a normal bowel movement for a few days.  Please Note:  You might notice some irritation and congestion in your nose or some drainage.  This is from the oxygen used during your procedure.  There is no need for concern and it should clear up in a day or so.  SYMPTOMS TO REPORT IMMEDIATELY:   Following lower endoscopy (colonoscopy or flexible sigmoidoscopy):  Excessive amounts of blood in the stool  Significant tenderness or worsening of abdominal pains  Swelling of the abdomen that is new, acute  Fever of 100F or higher   Following upper endoscopy (EGD)  Vomiting of blood or coffee ground material  New chest pain or pain under the shoulder blades  Painful or persistently difficult swallowing  New shortness of breath  Fever of 100F or higher  Black, tarry-looking stools  For urgent or emergent issues, a gastroenterologist can be reached at any hour by calling [phone].   DIET:  We do recommend a small meal at first, but then you may proceed to your regular diet.  Drink  plenty of fluids but you should avoid alcoholic beverages for 24 hours.  ACTIVITY:  You should plan to take it easy for the rest of today and you should NOT DRIVE or use heavy machinery until tomorrow (because of the sedation medicines used during the test).    FOLLOW UP: Our staff will call the number listed on your records the next business day following your procedure to check on you and address any questions or concerns that you may have regarding the information given to you following your procedure. If we do not reach you, we will leave a message.  However, if you are feeling well and you are not experiencing any problems, there is no need to return our call.  We will assume that you have returned to your regular daily activities without incident.  If any biopsies were taken you will be contacted by phone or by letter within the next 1-3 weeks.  Please call us at [phone] if you have not heard about the biopsies in 3 weeks.    SIGNATURES/CONFIDENTIALITY: You and/or your care partner have signed paperwork which will be entered into your electronic medical record.  These signatures attest to the fact that that the information above on your After Visit Summary has been reviewed and is understood.  Full responsibility of the confidentiality of this discharge information lies with you and/or your care-partner.    Handouts were given to your care partner on polyps  and hemorrhoids, You may resume your current medications today. Await biopsy results. Please call if any questions or concerns.

## 2016-05-15 NOTE — Progress Notes (Signed)
Called to room to assist during endoscopic procedure.  Patient ID and intended procedure confirmed with present staff. Received instructions for my participation in the procedure from the performing physician.  

## 2016-05-15 NOTE — Progress Notes (Signed)
Pt c/o her tongue felt swollen.  I looked at her tongue and it appeared normal.  I asked Lafe Garin, CRNA to look at it.  He also said it looked normal.  No redness or swelling noted.  I gave pt some ice when she was awake to see if it sooth it.  No other complaints noted in the recovery room.  Maw  Pt was placed on a bedpan at her request to void urine.  maw

## 2016-05-16 ENCOUNTER — Telehealth: Payer: Self-pay | Admitting: *Deleted

## 2016-05-16 NOTE — Telephone Encounter (Signed)
  Follow up Call-  Call back number 05/15/2016  Post procedure Call Back phone  # [phone]  Permission to leave phone message Yes  Some recent data might be hidden     Patient questions:  Do you have a fever, pain , or abdominal swelling? No. Pain Score  0 *  Have you tolerated food without any problems? Yes.    Have you been able to return to your normal activities? Yes.    Do you have any questions about your discharge instructions: Diet   No. Medications  No. Follow up visit  No.  Do you have questions or concerns about your Care? No.  Actions: * If pain score is 4 or above: No action needed, pain <4.  Pt. Stated that she did have some swelling in her hands and when questioned she stated she does have problems with swelling when take in high sodium she stated she takes a diuretic and she has already taken this morning she denies pain and any other problems.

## 2016-05-28 ENCOUNTER — Encounter: Payer: Self-pay | Admitting: Gastroenterology

## 2016-06-01 ENCOUNTER — Encounter: Payer: Self-pay | Admitting: Internal Medicine

## 2016-06-01 ENCOUNTER — Telehealth: Payer: Self-pay | Admitting: Gastroenterology

## 2016-06-01 ENCOUNTER — Ambulatory Visit (INDEPENDENT_AMBULATORY_CARE_PROVIDER_SITE_OTHER): Payer: BLUE CROSS/BLUE SHIELD | Admitting: Internal Medicine

## 2016-06-01 VITALS — BP 110/68 | HR 69 | Ht 63.0 in | Wt 137.6 lb

## 2016-06-01 DIAGNOSIS — E785 Hyperlipidemia, unspecified: Secondary | ICD-10-CM | POA: Diagnosis not present

## 2016-06-01 LAB — LIPID PANEL
CHOL/HDL RATIO: 4.8 ratio (ref <5.0)
Cholesterol: 230 mg/dL — ABNORMAL HIGH (ref <200)
HDL: 48 mg/dL — AB (ref >50)
LDL CALC: 150 mg/dL — AB (ref <100)
Triglycerides: 161 mg/dL — ABNORMAL HIGH (ref <150)
VLDL: 32 mg/dL — ABNORMAL HIGH (ref <30)

## 2016-06-01 MED ORDER — HYDROCHLOROTHIAZIDE 25 MG PO TABS: ORAL_TABLET | ORAL | 3 refills | Status: DC

## 2016-06-01 MED ORDER — EZETIMIBE 10 MG PO TABS: 10.0000 mg | ORAL_TABLET | Freq: Every day | ORAL | 0 refills | Status: DC

## 2016-06-01 MED ORDER — LISINOPRIL 10 MG PO TABS: 10.0000 mg | ORAL_TABLET | Freq: Every day | ORAL | 3 refills | Status: DC

## 2016-06-01 NOTE — Progress Notes (Signed)
Cardiology Office Note   Date:  06/01/2016   ID:  Molly Peterson, DOB 1966-02-26, MRN [id number]  PCP:  Nyoka Cowden, MD  Cardiologist:   Dorris Carnes, MD   F/U of HTN      History of Present Illness: Molly Peterson is a 50 y.o. female with a history of HTN, palpitations and HL  I saw her in December 2016  [facility]    Since seen she denies palpitatiosn  Breathing is OK  Occasional dizziness with standing    Not as active with exercise  Caring fro relatives at home         Outpatient Medications Prior to Visit  Medication Sig Dispense Refill  . ALPRAZolam (XANAX) 0.25 MG tablet Take 1 tablet (0.25 mg total) by mouth 3 (three) times daily as needed for anxiety. 40 tablet 0  . diphenhydrAMINE (BENADRYL) 25 MG tablet Take 25 mg by mouth every 6 (six) hours as needed for itching.    . estradiol (ESTRACE) 2 MG tablet Take 2 mg by mouth daily.     . famotidine (PEPCID) 20 MG tablet Take 20 mg by mouth daily as needed (for allergic reaction).    . fluticasone (FLONASE) 50 MCG/ACT nasal spray Place 2 sprays into the nose daily as needed for allergies or rhinitis.     . hydrochlorothiazide (HYDRODIURIL) 25 MG tablet TAKE 1 TABLET (25 MG TOTAL) BY MOUTH DAILY. 90 tablet 1  . lisinopril (PRINIVIL,ZESTRIL) 10 MG tablet Take 1 tablet (10 mg total) by mouth daily. 90 tablet 3  . Loratadine 10 MG CAPS Take by mouth.    . EPINEPHrine (EPI-PEN) 0.3 mg/0.3 mL SOAJ Inject 0.3 mLs (0.3 mg total) into the muscle once as needed. (Patient not taking: Reported on 06/01/2016) 1 Device 2   Facility-Administered Medications Prior to Visit  Medication Dose Route Frequency Provider Last Rate Last Dose  . 0.9 %  sodium chloride infusion  500 mL Intravenous Continuous Ladene Artist, MD         Allergies:   Latex   Past Medical History:  Diagnosis Date  . Acute pharyngitis   . ANXIETY 09/18/2006  . DEPRESSION 09/18/2006  . DERMATITIS, SEBORRHEIC NEC 01/06/2007  . Edema    symptom    . Flatulence, eructation, and gas pain   . HYPERTENSION 09/18/2006  . Palpitations 09/18/2006  . REACTION, ACUTE STRESS W/EMOTIONAL DSTURB 12/23/2006  . Unspecified constipation   . Urinary tract infection, site not specified     Past Surgical History:  Procedure Laterality Date  . ABDOMINAL HYSTERECTOMY     partial  . APPENDECTOMY  unknown  . Caesarean     x2  . TONSILLECTOMY  unknown     Social History:  The patient  reports that she quit smoking about 13 years ago. She has never used smokeless tobacco. She reports that she drinks alcohol. She reports that she does not use drugs.   Family History:  The patient's family history includes Heart disease in her father and mother; Hypertension in her father.    ROS:  Please see the history of present illness. All other systems are reviewed and  Negative to the above problem except as noted.    PHYSICAL EXAM: VS:  BP 110/68   Pulse 69   Ht 5\' 3"  (1.6 m)   Wt 137 lb 9.6 oz (62.4 kg)   SpO2 98%   BMI 24.37 kg/m   GEN: Well nourished, well developed, in no  acute distress HEENT: normal Neck: no JVD, carotid bruits, or masses Cardiac: RRR; no murmurs, rubs, or gallops,no edema  Respiratory:  clear to auscultation bilaterally, normal work of breathing GI: soft, nontender, nondistended, + BS  No hepatomegaly  MS: no deformity Moving all extremities   Skin: warm and dry, no rash Neuro:  Strength and sensation are intact Psych: euthymic mood, full affect   EKG:  EKG is  Not ordered today.   Lipid Panel    Component Value Date/Time   CHOL 242 (H) 07/21/2014 0839   TRIG 266.0 (H) 07/21/2014 0839   HDL 42.90 07/21/2014 0839   CHOLHDL 6 07/21/2014 0839   VLDL 53.2 (H) 07/21/2014 0839   LDLDIRECT 140.0 07/21/2014 0839      Wt Readings from Last 3 Encounters:  06/01/16 137 lb 9.6 oz (62.4 kg)  05/15/16 139 lb (63 kg)  04/26/16 139 lb (63 kg)      ASSESSMENT AND PLAN:  1  HTN  Good control today  She is only taking HCTZ  a few times per wk and says swelling is down  2  HL  Discussed with pharmacy  Will check lipids (nonfasting)today  Then get Zetia to [facility] in [place] for $30/month   F/U lipids in 3 months   3  Plapitations  Pt denies    F?U in 1 year   Stay active     Current medicines are reviewed at length with the patient today.  The patient does not have concerns regarding medicines.  Signed, Dorris Carnes, MD  06/01/2016 2:47 PM    [address], [place], [place]  [address] Phone: [phone]; Fax: [phone]

## 2016-06-01 NOTE — Telephone Encounter (Signed)
All questions about pathology letter answered.  She will call back for any additional questions or concerns.

## 2016-06-01 NOTE — Patient Instructions (Signed)
Your physician has recommended you make the following change in your medication:   1.) start Zetia 10 mg once a day  Your physician recommends that you return for lab work ([place])

## 2016-06-05 ENCOUNTER — Other Ambulatory Visit: Payer: Self-pay | Admitting: *Deleted

## 2016-06-05 DIAGNOSIS — E785 Hyperlipidemia, unspecified: Secondary | ICD-10-CM

## 2016-08-17 ENCOUNTER — Telehealth: Payer: Self-pay | Admitting: Internal Medicine

## 2016-08-17 NOTE — Telephone Encounter (Signed)
Called the [facility], they got her medication ready today and have notified her via automated message.    Lisinopril was last written for refill in December, 90 day supply, 3 refills.   It is a current prescription.

## 2016-08-17 NOTE — Telephone Encounter (Signed)
New Message   *STAT* If patient is at the pharmacy, call can be transferred to refill team.   1. Which medications need to be refilled? (please list name of each medication and dose if known) lisinopril 10 mg tablet once daily  2. Which pharmacy/location (including street and city if local pharmacy) is medication to be sent to? [place], [address] [place], [place], [place]  3. Do they need a 30 day or 90 day supply? 90 day supply

## 2016-08-28 ENCOUNTER — Other Ambulatory Visit: Payer: BLUE CROSS/BLUE SHIELD

## 2016-09-22 ENCOUNTER — Other Ambulatory Visit: Payer: Self-pay | Admitting: Internal Medicine

## 2016-11-06 ENCOUNTER — Telehealth: Payer: Self-pay | Admitting: Internal Medicine

## 2016-11-06 NOTE — Telephone Encounter (Signed)
Will forward to Dr. Harrington Challenger' nurse.

## 2016-11-06 NOTE — Telephone Encounter (Signed)
New Message    Please fax order for lipid blood work to [facility] [phone] , its free for her to get blood work done at her job

## 2016-11-09 NOTE — Telephone Encounter (Signed)
Prescription for lipids faxed to fax number provided at this time, requesting results be faxed to Dr. Harrington Challenger.

## 2016-11-16 ENCOUNTER — Other Ambulatory Visit: Payer: Self-pay

## 2016-11-16 ENCOUNTER — Other Ambulatory Visit: Payer: BLUE CROSS/BLUE SHIELD

## 2016-11-16 DIAGNOSIS — E785 Hyperlipidemia, unspecified: Secondary | ICD-10-CM

## 2016-11-19 ENCOUNTER — Other Ambulatory Visit: Payer: Self-pay | Admitting: Obstetrics and Gynecology

## 2016-11-19 DIAGNOSIS — Z1231 Encounter for screening mammogram for malignant neoplasm of breast: Secondary | ICD-10-CM

## 2016-11-21 LAB — LIPID PANEL W/O CHOL/HDL RATIO
Cholesterol, Total: 206 mg/dL — ABNORMAL HIGH (ref 100–199)
HDL: 52 mg/dL (ref >39)
LDL Calculated: 131 mg/dL — ABNORMAL HIGH (ref 0–99)
TRIGLYCERIDES: 116 mg/dL (ref 0–149)
VLDL CHOLESTEROL CAL: 23 mg/dL (ref 5–40)

## 2016-11-21 LAB — SPECIMEN STATUS REPORT

## 2016-12-18 DIAGNOSIS — F419 Anxiety disorder, unspecified: Secondary | ICD-10-CM | POA: Diagnosis not present

## 2016-12-18 DIAGNOSIS — R102 Pelvic and perineal pain: Secondary | ICD-10-CM | POA: Diagnosis not present

## 2017-01-01 ENCOUNTER — Other Ambulatory Visit: Payer: Self-pay | Admitting: *Deleted

## 2017-01-01 MED ORDER — EZETIMIBE 10 MG PO TABS: 10.0000 mg | ORAL_TABLET | Freq: Every day | ORAL | 1 refills | Status: DC

## 2017-01-09 ENCOUNTER — Telehealth: Payer: Self-pay | Admitting: Internal Medicine

## 2017-01-09 NOTE — Telephone Encounter (Signed)
Patient reported that she is in the process of packing/moving and is under a tremendous amount of stress.

## 2017-01-09 NOTE — Telephone Encounter (Signed)
Pain for 2 days, from armpit to 2 inches in and up to her shoulder.    Constant, no different with activity.  Today right side is better and but same symptoms today but on left side.   Pressing on makes it worse.  Used heating pad on right side last night.  Advised to try ibuprofen, 2 tablets every 6 hours to see if improves.  Advised to take with food.  Advised to call back if not improves or if she develops any other new symptoms such as SOB, fatigue.  She verbalizes understanding and appreciation for the call back.

## 2017-01-09 NOTE — Telephone Encounter (Signed)
New message    Pt is calling asking for a call back. She said she is having pain in the front between her shoulders. Not in her chest. Going from left to right.  She said she has been moving this week and doesn't know if it's stress.   Pt c/o of Chest Pain: STAT if CP now or developed within 24 hours  1. Are you having CP right now? Pt states its not chest pain, it's above her chest.  2. Are you experiencing any other symptoms (ex. SOB, nausea, vomiting, sweating)? No   3. How long have you been experiencing CP? Started Monday. Was all on the right side, now on the left.  4. Is your CP continuous or coming and going? continuous  5. Have you taken Nitroglycerin? No  ?

## 2017-01-11 ENCOUNTER — Ambulatory Visit
Admission: RE | Admit: 2017-01-11 | Discharge: 2017-01-11 | Disposition: A | Discharge status: Home / Self Care | Payer: BLUE CROSS/BLUE SHIELD | Source: Ambulatory Visit | Attending: Obstetrics and Gynecology | Admitting: Obstetrics and Gynecology

## 2017-01-11 DIAGNOSIS — Z1231 Encounter for screening mammogram for malignant neoplasm of breast: Secondary | ICD-10-CM | POA: Diagnosis not present

## 2017-03-05 DIAGNOSIS — Z6824 Body mass index (BMI) 24.0-24.9, adult: Secondary | ICD-10-CM | POA: Diagnosis not present

## 2017-03-05 DIAGNOSIS — Z13820 Encounter for screening for osteoporosis: Secondary | ICD-10-CM | POA: Diagnosis not present

## 2017-03-05 DIAGNOSIS — Z01419 Encounter for gynecological examination (general) (routine) without abnormal findings: Secondary | ICD-10-CM | POA: Diagnosis not present

## 2017-03-05 LAB — HM PAP SMEAR: HM Pap smear: NEGATIVE

## 2017-03-07 ENCOUNTER — Encounter: Payer: Self-pay | Admitting: Internal Medicine

## 2017-04-29 ENCOUNTER — Encounter: Payer: Self-pay | Admitting: Internal Medicine

## 2017-04-29 ENCOUNTER — Ambulatory Visit (INDEPENDENT_AMBULATORY_CARE_PROVIDER_SITE_OTHER): Payer: BLUE CROSS/BLUE SHIELD | Admitting: Internal Medicine

## 2017-04-29 VITALS — BP 114/68 | HR 77 | Ht 63.0 in | Wt 140.4 lb

## 2017-04-29 DIAGNOSIS — E785 Hyperlipidemia, unspecified: Secondary | ICD-10-CM

## 2017-04-29 DIAGNOSIS — R0602 Shortness of breath: Secondary | ICD-10-CM

## 2017-04-29 DIAGNOSIS — R002 Palpitations: Secondary | ICD-10-CM

## 2017-04-29 NOTE — Progress Notes (Signed)
Cardiology Office Note   Date:  04/29/2017   ID:  [name], DOB 17-Apr-1966, MRN [id number]  PCP:  Marletta Lor, MD  Cardiologist:   Dorris Carnes, MD   F/U of HTN      History of Present Illness: Molly Peterson is a 51 y.o. female with a history of HTN, palpitations and HL  I saw her in December 2017  [facility]   The pt says that she has noticed some fluttering in her chest  Episodes will last about 1 min  Will wake her from sleep   She has also noted she "gasps' at times  Not associated eith activitiy  Began in May or June  She wonders if related to [place]. She denies CP Does says that she does get SOB when she walks across campus  No worse  She was drinking more caffeine but has pulled  Back   Now drinking more juice    Outpatient Medications Prior to Visit  Medication Sig Dispense Refill  . ALPRAZolam (XANAX) 0.25 MG tablet Take 1 tablet (0.25 mg total) by mouth 3 (three) times daily as needed for anxiety. 40 tablet 0  . diphenhydrAMINE (BENADRYL) 25 MG tablet Take 25 mg by mouth every 6 (six) hours as needed for itching.    . estradiol (ESTRACE) 2 MG tablet Take 2 mg every other day by mouth.     . famotidine (PEPCID) 20 MG tablet Take 20 mg by mouth daily as needed (for allergic reaction).    . fluticasone (FLONASE) 50 MCG/ACT nasal spray Place 2 sprays into the nose daily as needed for allergies or rhinitis.     . hydrochlorothiazide (HYDRODIURIL) 25 MG tablet TAKE 1 TABLET (25 MG TOTAL) BY MOUTH DAILY. (Patient taking differently: Take 25 mg every other day by mouth. TAKE 1 TABLET (25 MG TOTAL) BY MOUTH DAILY.) 90 tablet 3  . lisinopril (PRINIVIL,ZESTRIL) 10 MG tablet Take 1 tablet (10 mg total) by mouth daily. 90 tablet 3  . Loratadine 10 MG CAPS Take as needed by mouth.     . ezetimibe (ZETIA) 10 MG tablet Take 1 tablet (10 mg total) by mouth daily. 90 tablet 1   Facility-Administered Medications Prior to Visit  Medication Dose Route Frequency  Provider Last Rate Last Dose  . 0.9 %  sodium chloride infusion  500 mL Intravenous Continuous Ladene Artist, MD         Allergies:   Latex   Past Medical History:  Diagnosis Date  . Acute pharyngitis   . ANXIETY 09/18/2006  . DEPRESSION 09/18/2006  . DERMATITIS, SEBORRHEIC NEC 01/06/2007  . Edema    symptom  . Flatulence, eructation, and gas pain   . HYPERTENSION 09/18/2006  . Palpitations 09/18/2006  . REACTION, ACUTE STRESS W/EMOTIONAL DSTURB 12/23/2006  . Unspecified constipation   . Urinary tract infection, site not specified     Past Surgical History:  Procedure Laterality Date  . ABDOMINAL HYSTERECTOMY     partial  . APPENDECTOMY  unknown  . Caesarean     x2  . TONSILLECTOMY  unknown     Social History:  The patient  reports that she quit smoking about 14 years ago. she has never used smokeless tobacco. She reports that she drinks alcohol. She reports that she does not use drugs.   Family History:  The patient's family history includes Heart disease in her father and mother; Hypertension in her father.    ROS:  Please see the history of present illness. All other systems are reviewed and  Negative to the above problem except as noted.    PHYSICAL EXAM: VS:  BP 114/68   Pulse 77   Ht 5\' 3"  (1.6 m)   Wt 140 lb 6.4 oz (63.7 kg)   BMI 24.87 kg/m   GEN: Well nourished, well developed, in no acute distress  HEENT: normal  Neck: JVP normal  No, carotid bruits, or masses Cardiac: RRR; no murmurs, rubs, or gallops,no edema  Respiratory:  clear to auscultation bilaterally, normal work of breathing GI: soft, nontender, nondistended, + BS  No hepatomegaly  MS: no deformity Moving all extremities   Skin: warm and dry, no rash Neuro:  Strength and sensation are intact Psych: euthymic mood, full affect   EKG:  EKG is t ordered today.  SR 77 bpm   Lipid Panel    Component Value Date/Time   CHOL 206 (H) 11/16/2016 0000   TRIG 116 11/16/2016 0000   HDL 52 11/16/2016  0000   CHOLHDL 4.8 06/01/2016 1534   VLDL 32 (H) 06/01/2016 1534   LDLCALC 131 (H) 11/16/2016 0000   LDLDIRECT 140.0 07/21/2014 0839      Wt Readings from Last 3 Encounters:  04/29/17 140 lb 6.4 oz (63.7 kg)  06/01/16 137 lb 9.6 oz (62.4 kg)  05/15/16 139 lb (63 kg)      ASSESSMENT AND PLAN:  1  HTN  BP is good  2  Dyspnea  Will set up for echo  To eval diastolic functoin and atrial size  3  HL  Will set up for fasting lipids  4  "gasps"  Not clear what this is   I do not think cardiac  Consider stopping zetia after labs drawn  5  Fluttering.  Cut back on caffeine and carrbs  Get echo   Not heimodynamically destabilizing  Encouraged her to follow rates on I watch    2  HL  Discussed with pharmacy  Will check lipids (nonfasting)today  Then get Zetia to [facility] in [place] for $30/month   F/U lipids in 3 months   3  Plapitations  Pt denies    F?U in 1 year   Stay active     Current medicines are reviewed at length with the patient today.  The patient does not have concerns regarding medicines.  Signed, Dorris Carnes, MD  04/29/2017 2:39 PM    [address], [place], [place]  [address] Phone: [phone]; Fax: [phone]

## 2017-04-29 NOTE — Patient Instructions (Signed)
Your physician recommends that you continue on your current medications as directed. Please refer to the Current Medication list given to you today.  Your physician recommends that you return for lab work when you return for echocardiogram (BMET, LIPIDS)  Your physician has requested that you have an echocardiogram. Echocardiography is a painless test that uses sound waves to create images of your heart. It provides your doctor with information about the size and shape of your heart and how well your heart's chambers and valves are working. This procedure takes approximately one hour. There are no restrictions for this procedure.

## 2017-05-16 ENCOUNTER — Other Ambulatory Visit: Payer: Self-pay

## 2017-05-16 ENCOUNTER — Ambulatory Visit (HOSPITAL_COMMUNITY): Payer: BLUE CROSS/BLUE SHIELD | Attending: Internal Medicine

## 2017-05-16 DIAGNOSIS — Z87891 Personal history of nicotine dependence: Secondary | ICD-10-CM | POA: Diagnosis not present

## 2017-05-16 DIAGNOSIS — R0602 Shortness of breath: Secondary | ICD-10-CM

## 2017-05-16 DIAGNOSIS — E785 Hyperlipidemia, unspecified: Secondary | ICD-10-CM

## 2017-05-16 DIAGNOSIS — R609 Edema, unspecified: Secondary | ICD-10-CM | POA: Insufficient documentation

## 2017-05-16 DIAGNOSIS — I119 Hypertensive heart disease without heart failure: Secondary | ICD-10-CM | POA: Insufficient documentation

## 2017-06-05 DIAGNOSIS — D485 Neoplasm of uncertain behavior of skin: Secondary | ICD-10-CM | POA: Diagnosis not present

## 2017-06-05 DIAGNOSIS — L814 Other melanin hyperpigmentation: Secondary | ICD-10-CM | POA: Diagnosis not present

## 2017-06-05 DIAGNOSIS — L821 Other seborrheic keratosis: Secondary | ICD-10-CM | POA: Diagnosis not present

## 2017-06-05 DIAGNOSIS — D1801 Hemangioma of skin and subcutaneous tissue: Secondary | ICD-10-CM | POA: Diagnosis not present

## 2017-06-25 ENCOUNTER — Other Ambulatory Visit: Payer: Self-pay | Admitting: Internal Medicine

## 2017-07-23 ENCOUNTER — Ambulatory Visit: Payer: BLUE CROSS/BLUE SHIELD | Admitting: Medical

## 2017-07-23 VITALS — BP 112/70 | HR 86 | Temp 97.6°F | Resp 16 | Wt 142.4 lb

## 2017-07-23 DIAGNOSIS — R059 Cough, unspecified: Secondary | ICD-10-CM

## 2017-07-23 DIAGNOSIS — R05 Cough: Secondary | ICD-10-CM

## 2017-07-23 DIAGNOSIS — J069 Acute upper respiratory infection, unspecified: Secondary | ICD-10-CM

## 2017-07-23 MED ORDER — BENZONATATE 200 MG PO CAPS: 200.0000 mg | ORAL_CAPSULE | Freq: Three times a day (TID) | ORAL | 0 refills | Status: DC | PRN

## 2017-07-23 MED ORDER — AMOXICILLIN-POT CLAVULANATE 875-125 MG PO TABS: 1.0000 | ORAL_TABLET | Freq: Two times a day (BID) | ORAL | 0 refills | Status: DC

## 2017-07-23 NOTE — Patient Instructions (Signed)
Cough, Adult A cough helps to clear your throat and lungs. A cough may last only 2-3 weeks (acute), or it may last longer than 8 weeks (chronic). Many different things can cause a cough. A cough may be a sign of an illness or another medical condition. Follow these instructions at home:  Pay attention to any changes in your cough.  Take medicines only as told by your doctor. ? If you were prescribed an antibiotic medicine, take it as told by your doctor. Do not stop taking it even if you start to feel better. ? Talk with your doctor before you try using a cough medicine.  Drink enough fluid to keep your pee (urine) clear or pale yellow.  If the air is dry, use a cold steam vaporizer or humidifier in your home.  Stay away from things that make you cough at work or at home.  If your cough is worse at night, try using extra pillows to raise your head up higher while you sleep.  Do not smoke, and try not to be around smoke. If you need help quitting, ask your doctor.  Do not have caffeine.  Do not drink alcohol.  Rest as needed. Contact a doctor if:  You have new problems (symptoms).  You cough up yellow fluid (pus).  Your cough does not get better after 2-3 weeks, or your cough gets worse.  Medicine does not help your cough and you are not sleeping well.  You have pain that gets worse or pain that is not helped with medicine.  You have a fever.  You are losing weight and you do not know why.  You have night sweats. Get help right away if:  You cough up blood.  You have trouble breathing.  Your heartbeat is very fast. This information is not intended to replace advice given to you by your health care provider. Make sure you discuss any questions you have with your health care provider. Document Released: 02/15/2011 Document Revised: 11/10/2015 Document Reviewed: 08/11/2014 Elsevier Interactive Patient Education  2018 [employer]. Sinusitis, Adult Sinusitis is  soreness and inflammation of your sinuses. Sinuses are hollow spaces in the bones around your face. They are located:  Around your eyes.  In the middle of your forehead.  Behind your nose.  In your cheekbones.  Your sinuses and nasal passages are lined with a stringy fluid (mucus). Mucus normally drains out of your sinuses. When your nasal tissues get inflamed or swollen, the mucus can get trapped or blocked so air cannot flow through your sinuses. This lets bacteria, viruses, and funguses grow, and that leads to infection. Follow these instructions at home: Medicines  Take, use, or apply over-the-counter and prescription medicines only as told by your doctor. These may include nasal sprays.  If you were prescribed an antibiotic medicine, take it as told by your doctor. Do not stop taking the antibiotic even if you start to feel better. Hydrate and Humidify  Drink enough water to keep your pee (urine) clear or pale yellow.  Use a cool mist humidifier to keep the humidity level in your home above 50%.  Breathe in steam for 10-15 minutes, 3-4 times a day or as told by your doctor. You can do this in the bathroom while a hot shower is running.  Try not to spend time in cool or dry air. Rest  Rest as much as possible.  Sleep with your head raised (elevated).  Make sure to get enough sleep   each night. General instructions  Put a warm, moist washcloth on your face 3-4 times a day or as told by your doctor. This will help with discomfort.  Wash your hands often with soap and water. If there is no soap and water, use hand sanitizer.  Do not smoke. Avoid being around people who are smoking (secondhand smoke).  Keep all follow-up visits as told by your doctor. This is important. Contact a doctor if:  You have a fever.  Your symptoms get worse.  Your symptoms do not get better within 10 days. Get help right away if:  You have a very bad headache.  You cannot stop throwing up  (vomiting).  You have pain or swelling around your face or eyes.  You have trouble seeing.  You feel confused.  Your neck is stiff.  You have trouble breathing. This information is not intended to replace advice given to you by your health care provider. Make sure you discuss any questions you have with your health care provider. Document Released: 11/21/2007 Document Revised: 01/29/2016 Document Reviewed: 03/30/2015 Elsevier Interactive Patient Education  2018 [place]. Upper Respiratory Infection, Adult Most upper respiratory infections (URIs) are caused by a virus. A URI affects the nose, throat, and upper air passages. The most common type of URI is often called "the common cold." Follow these instructions at home:  Take medicines only as told by your doctor.  Gargle warm saltwater or take cough drops to comfort your throat as told by your doctor.  Use a warm mist humidifier or inhale steam from a shower to increase air moisture. This may make it easier to breathe.  Drink enough fluid to keep your pee (urine) clear or pale yellow.  Eat soups and other clear broths.  Have a healthy diet.  Rest as needed.  Go back to work when your fever is gone or your doctor says it is okay. ? You may need to stay home longer to avoid giving your URI to others. ? You can also wear a face mask and wash your hands often to prevent spread of the virus.  Use your inhaler more if you have asthma.  Do not use any tobacco products, including cigarettes, chewing tobacco, or electronic cigarettes. If you need help quitting, ask your doctor. Contact a doctor if:  You are getting worse, not better.  Your symptoms are not helped by medicine.  You have chills.  You are getting more short of breath.  You have brown or red mucus.  You have yellow or brown discharge from your nose.  You have pain in your face, especially when you bend forward.  You have a fever.  You have puffy  (swollen) neck glands.  You have pain while swallowing.  You have white areas in the back of your throat. Get help right away if:  You have very bad or constant: ? Headache. ? Ear pain. ? Pain in your forehead, behind your eyes, and over your cheekbones (sinus pain). ? Chest pain.  You have long-lasting (chronic) lung disease and any of the following: ? Wheezing. ? Long-lasting cough. ? Coughing up blood. ? A change in your usual mucus.  You have a stiff neck.  You have changes in your: ? Vision. ? Hearing. ? Thinking. ? Mood. This information is not intended to replace advice given to you by your health care provider. Make sure you discuss any questions you have with your health care provider. Document Released: 11/21/2007 Document Revised:  02/05/2016 Document Reviewed: 09/09/2013 Elsevier Interactive Patient Education  [employer].

## 2017-07-23 NOTE — Progress Notes (Signed)
   Subjective:    Patient ID: Molly Peterson, female    DOB: Mar 27, 1966, 52 y.o.   MRN: [id number]  HPI  52 yo female in non acute distress. Cold symptoms  since Wednesday  Nasal congestion and blowing nose discharge is yellow to white. . Now with cough x  3 days productive clear. Tried  Delsym , Mucinex Fastmax no relief.Denies fever or chills.   Review of Systems  Constitutional: Positive for fatigue. Negative for chills and fever.  HENT: Positive for congestion, ear pain (rjight ear), postnasal drip, rhinorrhea and sore throat (iniiially but not now). Negative for nosebleeds, sinus pressure and sinus pain.   Eyes: Negative for discharge and itching.  Respiratory: Positive for cough. Negative for shortness of breath.   Cardiovascular: Negative for chest pain.  Gastrointestinal: Negative for abdominal pain.  Genitourinary: Negative for dysuria.  Musculoskeletal: Negative for myalgias (initially).  Skin: Negative for rash.  Neurological: Positive for facial asymmetry and light-headedness (Sunday with lightheadedness taking Delsym and Fast Masx before time was due). Negative for dizziness, speech difficulty and headaches.  Hematological: Negative for adenopathy.  Psychiatric/Behavioral: Negative for behavioral problems, self-injury and suicidal ideas. The patient is not nervous/anxious.        Objective:   Physical Exam  Constitutional: She is oriented to person, place, and time. She appears well-developed and well-nourished.  HENT:  Head: Normocephalic and atraumatic.  Right Ear: External ear normal.  Left Ear: External ear normal.  Eyes: Conjunctivae and EOM are normal. Pupils are equal, round, and reactive to light.  Neck: Normal range of motion. Neck supple.  Cardiovascular: Normal rate, regular rhythm and normal heart sounds.  Pulmonary/Chest: Effort normal and breath sounds normal. No respiratory distress. She has no wheezes. She has no rales.  Lymphadenopathy:    She has  no cervical adenopathy.  Neurological: She is alert and oriented to person, place, and time.  Skin: Skin is warm and dry.  Psychiatric: She has a normal mood and affect. Her behavior is normal. Judgment and thought content normal.  Nursing note and vitals reviewed.  Cough in room noted    Assessment & Plan:  Sinusitis/ Upper Respiratory Infection/ Cough Meds ordered this encounter  Medications  . amoxicillin-clavulanate (AUGMENTIN) 875-125 MG tablet    Sig: Take 1 tablet by mouth 2 (two) times daily.    Dispense:  20 tablet    Refill:  0  . benzonatate (TESSALON) 200 MG capsule    Sig: Take 1 capsule (200 mg total) by mouth 3 (three) times daily as needed for cough.    Dispense:  30 capsule    Refill:  0  Return to clinic in  3-5 days if not improving . Reviewed with patient cannot take sudafed or phenylephrine due to hypertension, may take Mucinex  Plain or  Coricidan Hbp Patient verbalizes understanding and has no questions at discharge.

## 2017-07-24 ENCOUNTER — Telehealth: Payer: Self-pay | Admitting: *Deleted

## 2017-07-24 NOTE — Progress Notes (Signed)
07/24/17 0930- Pt called with concern for worsening cough; coughed all night. Pt states she did start on abx and tessalon pearls as prescribed yesterday. After consulting with H. Ratcliffe PA-C, advised pt to continue medications as prescribed, add claritin 10mg  daily in the morning, and benadryl 25mg  at night to aid with cough and sleep. Further advised to call back later this week if symptoms do not improve with these recommendations. Pt verbalizes understanding and has no further needs or concerns at this time.

## 2017-07-26 ENCOUNTER — Other Ambulatory Visit: Payer: Self-pay | Admitting: Internal Medicine

## 2017-10-15 ENCOUNTER — Other Ambulatory Visit: Payer: Self-pay | Admitting: Internal Medicine

## 2017-10-16 ENCOUNTER — Other Ambulatory Visit: Payer: Self-pay

## 2017-10-17 ENCOUNTER — Other Ambulatory Visit: Payer: Self-pay | Admitting: Internal Medicine

## 2017-10-17 MED ORDER — HYDROCHLOROTHIAZIDE 25 MG PO TABS: ORAL_TABLET | ORAL | 1 refills | Status: DC

## 2017-10-17 NOTE — Telephone Encounter (Signed)
Pt's medication was sent to pt's pharmacy as requested. Confirmation received.  °

## 2017-10-17 NOTE — Telephone Encounter (Signed)
Please refill as ordered.

## 2018-01-15 ENCOUNTER — Encounter: Payer: Self-pay | Admitting: Adult Health

## 2018-01-15 ENCOUNTER — Ambulatory Visit: Payer: BLUE CROSS/BLUE SHIELD | Admitting: Adult Health

## 2018-01-15 VITALS — BP 110/74 | HR 87 | Temp 98.6°F | Resp 16 | Ht 63.0 in | Wt 140.0 lb

## 2018-01-15 DIAGNOSIS — R82998 Other abnormal findings in urine: Secondary | ICD-10-CM

## 2018-01-15 DIAGNOSIS — N390 Urinary tract infection, site not specified: Secondary | ICD-10-CM

## 2018-01-15 DIAGNOSIS — R399 Unspecified symptoms and signs involving the genitourinary system: Secondary | ICD-10-CM

## 2018-01-15 LAB — POCT URINALYSIS DIPSTICK
APPEARANCE: NORMAL
BILIRUBIN UA: NEGATIVE
Glucose, UA: NEGATIVE
KETONES UA: NEGATIVE
NITRITE UA: NEGATIVE
PH UA: 7 (ref 5.0–8.0)
PROTEIN UA: NEGATIVE
RBC UA: NEGATIVE
Spec Grav, UA: 1.025 (ref 1.010–1.025)
UROBILINOGEN UA: 1 U/dL

## 2018-01-15 MED ORDER — SULFAMETHOXAZOLE-TRIMETHOPRIM 800-160 MG PO TABS: 1.0000 | ORAL_TABLET | Freq: Two times a day (BID) | ORAL | 0 refills | Status: DC

## 2018-01-15 NOTE — Progress Notes (Signed)
Subjective:     Patient ID: Molly Peterson, female   DOB: [birth year], 52 y.o.   MRN: [id number]  Blood pressure 110/74, pulse 87, temperature 98.6 F (37 C), resp. rate 16, height 5\' 3"  (1.6 m), weight 140 lb (63.5 kg), SpO2 99 %.  Urinary Tract Infection   This is a new problem. The current episode started in the past 7 days (last three to four days ). The problem occurs intermittently. The problem has been unchanged. Quality: denies  The pain is at a severity of 0/10. The patient is experiencing no pain. There has been no fever. Fever duration: denies any fever. She is sexually active (monamougus married relationship denies any new partners or concerns). There is no history of pyelonephritis. Associated symptoms include frequency, hesitancy and urgency. Pertinent negatives include no chills, discharge, flank pain, hematuria, nausea, possible pregnancy, sweats or vomiting. She has tried increased fluids for the symptoms. The treatment provided no relief. There is no history of catheterization, kidney stones, recurrent UTIs, a single kidney, urinary stasis or a urological procedure.   Patient is a 52 year old female in no acute distress who comes to the clinic with urinary symptoms as above.  Patient  denies any fever, body aches,chills, rash, chest pain, shortness of breath, nausea, vomiting, or diarrhea.    Allergies  Allergen Reactions  . Latex Itching and Swelling    No LMP recorded. Patient has had a hysterectomy.  Review of Systems  Constitutional: Positive for fatigue (mild ). Negative for activity change, appetite change, chills, diaphoresis, fever and unexpected weight change.  HENT: Negative.   Eyes: Negative.   Respiratory: Negative.   Cardiovascular: Negative.   Gastrointestinal: Negative.  Negative for nausea and vomiting.  Endocrine: Negative.   Genitourinary: Positive for dysuria, frequency, hesitancy and urgency. Negative for decreased urine volume, difficulty  urinating, dyspareunia, enuresis, flank pain, genital sores, hematuria, menstrual problem, pelvic pain, vaginal bleeding, vaginal discharge and vaginal pain.  Musculoskeletal: Negative.   Skin: Negative.   Allergic/Immunologic: Positive for environmental allergies (latex ). Negative for food allergies and immunocompromised state.  Neurological: Negative.   Hematological: Negative.   Psychiatric/Behavioral: Negative.        Objective:   Physical Exam  Constitutional: She is oriented to person, place, and time. She appears well-developed and well-nourished. No distress.  Patient is alert and oriented and responsive to questions Engages in eye contact with provider. Speaks in full sentences without any pauses without any shortness of breath or distress.    HENT:  Head: Normocephalic and atraumatic.  Nose: Nose normal.  Eyes: Pupils are equal, round, and reactive to light. Conjunctivae and EOM are normal.  Neck: Normal range of motion. Neck supple.  Cardiovascular: Normal rate, regular rhythm, normal heart sounds and intact distal pulses. Exam reveals no gallop and no friction rub.  No murmur heard. Pulmonary/Chest: Effort normal and breath sounds normal. No stridor. No respiratory distress. She has no wheezes. She exhibits no tenderness.  Abdominal: Soft. Normal appearance, normal aorta and bowel sounds are normal. She exhibits no distension and no mass. There is no hepatosplenomegaly, splenomegaly or hepatomegaly. There is tenderness (mild tenderness suprapubic with deep palpation ) in the suprapubic area. There is no rigidity, no rebound, no guarding, no CVA tenderness, no tenderness at McBurney's point and negative Murphy's sign. No hernia.  Genitourinary:  Genitourinary Comments: No gynecology exams done in this office currently and no equipment available. Patient is aware she will have to see gynecology if needed  for any pelvic/vaginal exam and will follow up with gynecology/obgyn as  needed. Yearly gynecology pelvic exam recommended. Patient verbalized understanding of instructions and denies any further questions at this time.    Musculoskeletal: Normal range of motion.  Patient moves on and off of exam table and in room without difficulty. Gait is normal in hall and in room. Patient is oriented to person place time and situation. Patient answers questions appropriately and engages in conversation.   Lymphadenopathy:       Head (right side): No submental, no submandibular, no tonsillar, no preauricular, no posterior auricular and no occipital adenopathy present.       Head (left side): No submental, no submandibular, no tonsillar, no preauricular, no posterior auricular and no occipital adenopathy present.    She has no cervical adenopathy.  Neurological: She is alert and oriented to person, place, and time. She has normal strength.  Skin: Skin is warm, dry and intact. Capillary refill takes less than 2 seconds. She is not diaphoretic. No pallor.  Psychiatric: She has a normal mood and affect. Her speech is normal and behavior is normal. Judgment and thought content normal. Cognition and memory are normal.  Vitals reviewed.  Results for orders placed or performed in visit on 01/15/18 (from the past 24 hour(s))  POCT urinalysis dipstick     Status: Abnormal   Collection Time: 01/15/18  3:23 PM  Result Value Ref Range   Color, UA yellow    Clarity, UA clear    Glucose, UA Negative Negative   Bilirubin, UA negative    Ketones, UA negative    Spec Grav, UA 1.025 1.010 - 1.025   Blood, UA negative    pH, UA 7.0 5.0 - 8.0   Protein, UA Negative Negative   Urobilinogen, UA 1.0 0.2 or 1.0 E.U./dL   Nitrite, UA negative    Leukocytes, UA Trace (A) Negative   Appearance normal    Odor none        Assessment:     Urinary symptom or sign - Plan: Urine Culture, POCT urinalysis dipstick, CANCELED: POCT urine pregnancy  Urinary tract infection without hematuria, site  unspecified  Leukocytes in urine        Plan:   Orders Placed This Encounter  Procedures  . Urine Culture  . POCT urinalysis dipstick    Meds ordered this encounter  Medications  . sulfamethoxazole-trimethoprim (BACTRIM DS,SEPTRA DS) 800-160 MG tablet    Sig: Take 1 tablet by mouth 2 (two) times daily.    Dispense:  20 tablet    Refill:  0   No gynecology exams done in this office currently and no equipment available. Patient is aware she will have to see gynecology if needed for any pelvic/vaginal exam and will follow up with gynecology/obgyn as needed. Yearly gynecology pelvic exam recommended. Patient verbalized understanding of instructions and denies any further questions at this time.   Will call with urine culture results only if antibiotic needs to be changed based on culture and sensitivity.    Advised patient call the office or your primary care doctor for an appointment if no improvement within 72 hours or if any symptoms change or worsen at any time  Advised ER or urgent Care if after hours or on weekend. Call 911 for emergency symptoms at any time.Patinet verbalized understanding of all instructions given/reviewed and treatment plan and has no further questions or concerns at this time.    Patient verbalized understanding of all instructions given  and denies any further questions at this time.    Marland Kitchen

## 2018-01-15 NOTE — Patient Instructions (Addendum)
Sulfamethoxazole; Trimethoprim, SMX-TMP tablets What is this medicine? SULFAMETHOXAZOLE; TRIMETHOPRIM or SMX-TMP (suhl fuh meth OK suh zohl; trye METH oh prim) is a combination of a sulfonamide antibiotic and a second antibiotic, trimethoprim. It is used to treat or prevent certain kinds of bacterial infections. It will not work for colds, flu, or other viral infections. This medicine may be used for other purposes; ask your health care provider or pharmacist if you have questions. COMMON BRAND NAME(S): Bacter-Aid DS, Bactrim, Bactrim DS, Septra, Septra DS What should I tell my health care provider before I take this medicine? They need to know if you have any of these conditions: -anemia -asthma -being treated with anticonvulsants -if you frequently drink alcohol containing drinks -kidney disease -liver disease -low level of folic acid or glucose-6-phosphate dehydrogenase -poor nutrition or malabsorption -porphyria -severe allergies -thyroid disorder -an unusual or allergic reaction to sulfamethoxazole, trimethoprim, sulfa drugs, other medicines, foods, dyes, or preservatives -pregnant or trying to get pregnant -breast-feeding How should I use this medicine? Take this medicine by mouth with a full glass of water. Follow the directions on the prescription label. Take your medicine at regular intervals. Do not take it more often than directed. Do not skip doses or stop your medicine early. Talk to your pediatrician regarding the use of this medicine in children. Special care may be needed. This medicine has been used in children as young as 2 months of age. Overdosage: If you think you have taken too much of this medicine contact a poison control center or emergency room at once. NOTE: This medicine is only for you. Do not share this medicine with others. What if I miss a dose? If you miss a dose, take it as soon as you can. If it is almost time for your next dose, take only that dose. Do  not take double or extra doses. What may interact with this medicine? Do not take this medicine with any of the following medications: -aminobenzoate potassium -dofetilide -metronidazole This medicine may also interact with the following medications: -ACE inhibitors like benazepril, enalapril, lisinopril, and ramipril -birth control pills -cyclosporine -digoxin -diuretics -indomethacin -medicines for diabetes -methenamine -methotrexate -phenytoin -potassium supplements -pyrimethamine -sulfinpyrazone -tricyclic antidepressants -warfarin This list may not describe all possible interactions. Give your health care provider a list of all the medicines, herbs, non-prescription drugs, or dietary supplements you use. Also tell them if you smoke, drink alcohol, or use illegal drugs. Some items may interact with your medicine. What should I watch for while using this medicine? Tell your doctor or health care professional if your symptoms do not improve. Drink several glasses of water a day to reduce the risk of kidney problems. Do not treat diarrhea with over the counter products. Contact your doctor if you have diarrhea that lasts more than 2 days or if it is severe and watery. This medicine can make you more sensitive to the sun. Keep out of the sun. If you cannot avoid being in the sun, wear protective clothing and use a sunscreen. Do not use sun lamps or tanning beds/booths. What side effects may I notice from receiving this medicine? Side effects that you should report to your doctor or health care professional as soon as possible: -allergic reactions like skin rash or hives, swelling of the face, lips, or tongue -breathing problems -fever or chills, sore throat -irregular heartbeat, chest pain -joint or muscle pain -pain or difficulty passing urine -red pinpoint spots on skin -redness, blistering, peeling or loosening of   the skin, including inside the mouth -unusual bleeding or  bruising -unusually weak or tired -yellowing of the eyes or skin Side effects that usually do not require medical attention (report to your doctor or health care professional if they continue or are bothersome): -diarrhea -dizziness -headache -loss of appetite -nausea, vomiting -nervousness This list may not describe all possible side effects. Call your doctor for medical advice about side effects. You may report side effects to FDA at 1-800-FDA-1088. Where should I keep my medicine? Keep out of the reach of children. Store at room temperature between 20 to 25 degrees C (68 to 77 degrees F). Protect from light. Throw away any unused medicine after the expiration date. NOTE: This sheet is a summary. It may not cover all possible information. If you have questions about this medicine, talk to your doctor, pharmacist, or health care provider.  2018 Elsevier/[employer] Standard (2013-01-09 14:38:26) Urinary Tract Infection, Adult A urinary tract infection (UTI) is an infection of any part of the urinary tract. The urinary tract includes the:  Kidneys.  Ureters.  Bladder.  Urethra.  These organs make, store, and get rid of pee (urine) in the body. Follow these instructions at home:  Take over-the-counter and prescription medicines only as told by your doctor.  If you were prescribed an antibiotic medicine, take it as told by your doctor. Do not stop taking the antibiotic even if you start to feel better.  Avoid the following drinks: ? Alcohol. ? Caffeine. ? Tea. ? Carbonated drinks.  Drink enough fluid to keep your pee clear or pale yellow.  Keep all follow-up visits as told by your doctor. This is important.  Make sure to: ? Empty your bladder often and completely. Do not to hold pee for long periods of time. ? Empty your bladder before and after sex. ? Wipe from front to back after a bowel movement if you are female. Use each tissue one time when you wipe. Contact a doctor  if:  You have back pain.  You have a fever.  You feel sick to your stomach (nauseous).  You throw up (vomit).  Your symptoms do not get better after 3 days.  Your symptoms go away and then come back. Get help right away if:  You have very bad back pain.  You have very bad lower belly (abdominal) pain.  You are throwing up and cannot keep down any medicines or water. This information is not intended to replace advice given to you by your health care provider. Make sure you discuss any questions you have with your health care provider. Document Released: 11/21/2007 Document Revised: 11/10/2015 Document Reviewed: 04/25/2015 Elsevier Interactive Patient Education  [employer].

## 2018-01-17 ENCOUNTER — Telehealth: Payer: Self-pay | Admitting: Adult Health

## 2018-01-17 LAB — URINE CULTURE

## 2018-01-17 NOTE — Telephone Encounter (Addendum)
Results for orders placed or performed in visit on 01/15/18 (from the past 72 hour(s))  Urine Culture     Status: None   Collection Time: 01/15/18  2:49 PM  Result Value Ref Range   Urine Culture, Routine Final report    Organism ID, Bacteria Comment     Comment: Culture shows less than 10,000 colony forming units of bacteria per milliliter of urine. This colony count is not generally considered to be clinically significant.   POCT urinalysis dipstick     Status: Abnormal   Collection Time: 01/15/18  3:23 PM  Result Value Ref Range   Color, UA yellow    Clarity, UA clear    Glucose, UA Negative Negative   Bilirubin, UA negative    Ketones, UA negative    Spec Grav, UA 1.025 1.010 - 1.025   Blood, UA negative    pH, UA 7.0 5.0 - 8.0   Protein, UA Negative Negative   Urobilinogen, UA 1.0 0.2 or 1.0 E.U./dL   Nitrite, UA negative    Leukocytes, UA Trace (A) Negative   Appearance normal    Odor none     Called patient with above urine culture results, she reports " I am feeling better and urinary symptoms improving ", she denies any new symptoms. She will continue  antibiotic as prescribed and she will return to clinic if any symptoms persist or worsen. Recommend pelvic exam at gynecology if symptoms persist as well and she is aware this office does not perform pelvic exams.   Advised patient call the office or your primary care doctor for an appointment if no improvement within 72 hours or if any symptoms change or worsen at any time  Advised ER or urgent Care if after hours or on weekend. Call 911 for emergency symptoms at any time.Patinet verbalized understanding of all instructions given/reviewed and treatment plan and has no further questions or concerns at this time.    Patient verbalized understanding of all instructions given and denies any further questions at this time.

## 2018-01-17 NOTE — Progress Notes (Signed)
Patient was informed- see telephone note 01/17/18.

## 2018-03-17 DIAGNOSIS — Z6824 Body mass index (BMI) 24.0-24.9, adult: Secondary | ICD-10-CM | POA: Diagnosis not present

## 2018-03-17 DIAGNOSIS — Z01419 Encounter for gynecological examination (general) (routine) without abnormal findings: Secondary | ICD-10-CM | POA: Diagnosis not present

## 2018-04-03 ENCOUNTER — Other Ambulatory Visit: Payer: Self-pay | Admitting: Obstetrics and Gynecology

## 2018-04-03 ENCOUNTER — Telehealth: Payer: Self-pay | Admitting: Internal Medicine

## 2018-04-03 DIAGNOSIS — Z1231 Encounter for screening mammogram for malignant neoplasm of breast: Secondary | ICD-10-CM

## 2018-04-03 NOTE — Telephone Encounter (Signed)
° °  Patient is calling to request order for lipid panel to be mailed to her.

## 2018-04-03 NOTE — Telephone Encounter (Signed)
Prescription for lipids mailed to patient. Results to be faxed to Dr. Harrington Challenger.

## 2018-04-04 ENCOUNTER — Encounter: Payer: Self-pay | Admitting: Physician Assistant

## 2018-04-08 ENCOUNTER — Other Ambulatory Visit: Payer: Self-pay | Admitting: *Deleted

## 2018-04-08 MED ORDER — LISINOPRIL 10 MG PO TABS: 10.0000 mg | ORAL_TABLET | Freq: Every day | ORAL | 0 refills | Status: DC

## 2018-04-24 ENCOUNTER — Ambulatory Visit (INDEPENDENT_AMBULATORY_CARE_PROVIDER_SITE_OTHER): Payer: BLUE CROSS/BLUE SHIELD | Admitting: Physician Assistant

## 2018-04-24 ENCOUNTER — Telehealth: Payer: Self-pay | Admitting: Internal Medicine

## 2018-04-24 VITALS — Ht 63.0 in

## 2018-04-24 DIAGNOSIS — Z8249 Family history of ischemic heart disease and other diseases of the circulatory system: Secondary | ICD-10-CM | POA: Diagnosis not present

## 2018-04-24 DIAGNOSIS — E785 Hyperlipidemia, unspecified: Secondary | ICD-10-CM

## 2018-04-24 DIAGNOSIS — R079 Chest pain, unspecified: Secondary | ICD-10-CM

## 2018-04-24 DIAGNOSIS — R0602 Shortness of breath: Secondary | ICD-10-CM

## 2018-04-24 DIAGNOSIS — R002 Palpitations: Secondary | ICD-10-CM

## 2018-04-24 MED ORDER — METOPROLOL TARTRATE 50 MG PO TABS: 50.0000 mg | ORAL_TABLET | Freq: Once | ORAL | 0 refills | Status: DC

## 2018-04-24 MED ORDER — METOPROLOL TARTRATE 50 MG PO TABS: 100.0000 mg | ORAL_TABLET | Freq: Once | ORAL | 0 refills | Status: DC

## 2018-04-24 NOTE — Telephone Encounter (Signed)
New Message   Pt c/o medication issue:  1. Name of Medication: metoprolol tartrate (LOPRESSOR) 50 MG tablet  2. How are you currently taking this medication (dosage and times per day)? Take 2 tablets (100 mg total) by mouth once for 1 dose  3. Are you having a reaction (difficulty breathing--STAT)? no  4. What is your medication issue? [facility] states they received a prescription for Metoprolol 1 tablet 1 hr prior to ct and then another prescription for 2 tablets prior to ct and needs clarification on which one. Please call

## 2018-04-24 NOTE — Progress Notes (Signed)
Cardiology Office Note    Date:  04/24/2018   ID:  Molly Peterson, DOB [birth year], MRN [id number]  PCP:  Patient, No Pcp Per  Cardiologist:  Dr. Harrington Challenger  Chief Complaint: 12 Months follow up  History of Present Illness:   Molly Peterson is a 52 y.o. female with a history of HTN, palpitations, chronic diastolic CHF,  HL  and family hx of CAD presents for follow up.   Prior hx of palpitations. Advised to cut back on caffeine. Echo 04/2017 showed LVEF 60-65%, mild basal septal hypertrophy, normal wall motion, grade 1 DD, indeterminate LV filling pressure, trivial MR, normal LA size, normal IVC.   Here today for follow up. She has intermittent palpitations lasting for seconds to minutes. Also has chest pressure radiating to L arm and SOB. Last for few minutes. sys happing during my conversation however resting well. Has baseline anxiety. Takes Zetia 10mg .   Strong family hx of CAD - Father has multiple MIs. First at age 44 Mother with afib Grandfather died of CHF prior multiple MIs Grandmonther died of stroke  Past Medical History:  Diagnosis Date  . Acute pharyngitis   . ANXIETY 09/18/2006  . DEPRESSION 09/18/2006  . DERMATITIS, SEBORRHEIC NEC 01/06/2007  . Edema    symptom  . Flatulence, eructation, and gas pain   . HYPERTENSION 09/18/2006  . Palpitations 09/18/2006  . REACTION, ACUTE STRESS W/EMOTIONAL DSTURB 12/23/2006  . Unspecified constipation   . Urinary tract infection, site not specified     Past Surgical History:  Procedure Laterality Date  . ABDOMINAL HYSTERECTOMY     partial  . APPENDECTOMY  unknown  . Caesarean     x2  . TONSILLECTOMY  unknown    Current Medications: Prior to Admission medications   Medication Sig Start Date End Date Taking? Authorizing Provider  ALPRAZolam (XANAX) 0.25 MG tablet Take 1 tablet (0.25 mg total) by mouth 3 (three) times daily as needed for anxiety. 05/17/15  Yes Fay Records, MD  ezetimibe (ZETIA) 10 MG tablet TAKE 1 TABLET BY  MOUTH DAILY 07/29/17  Yes Fay Records, MD  famotidine (PEPCID) 20 MG tablet Take 20 mg by mouth daily as needed (for allergic reaction).   Yes [provider]  hydrochlorothiazide (HYDRODIURIL) 25 MG tablet TAKE 1 TABLET (25 MG TOTAL) BY MOUTH DAILY. 10/17/17  Yes Fay Records, MD  lisinopril (PRINIVIL,ZESTRIL) 10 MG tablet Take 1 tablet (10 mg total) by mouth daily. 04/08/18  Yes Fay Records, MD  Loratadine 10 MG CAPS Take as needed by mouth.    Yes [provider]  sulfamethoxazole-trimethoprim (BACTRIM DS,SEPTRA DS) 800-160 MG tablet Take 1 tablet by mouth 2 (two) times daily. 01/15/18  Yes Flinchum, Kelby Aline, FNP    Allergies:   Latex and Sulfa antibiotics   Social History   Socioeconomic History  . Marital status: Married    Spouse name: Not on file  . Number of children: 2  . Years of education: Not on file  . Highest education level: Not on file  Occupational History    Employer: UNEMPLOYED  Social Needs  . Financial resource strain: Not on file  . Food insecurity:    Worry: Not on file    Inability: Not on file  . Transportation needs:    Medical: Not on file    Non-medical: Not on file  Tobacco Use  . Smoking status: Former Smoker    Last attempt to quit: 06/18/2002  Years since quitting: 15.8  . Smokeless tobacco: Never Used  Substance and Sexual Activity  . Alcohol use: Yes    Comment: occasionally  . Drug use: No  . Sexual activity: Not on file  Lifestyle  . Physical activity:    Days per week: Not on file    Minutes per session: Not on file  . Stress: Not on file  Relationships  . Social connections:    Talks on phone: Not on file    Gets together: Not on file    Attends religious service: Not on file    Active member of club or organization: Not on file    Attends meetings of clubs or organizations: Not on file    Relationship status: Not on file  Other Topics Concern  . Not on file  Social History Narrative  . Not on file      Family History:  The patient's family history includes Heart disease in her father and mother; Hypertension in her father.   ROS:   Please see the history of present illness.    ROS All other systems reviewed and are negative.   PHYSICAL EXAM:   VS:  Ht 5\' 3"  (1.6 m)   BMI 24.80 kg/m    GEN: Well nourished, well developed, in no acute distress  HEENT: normal  Neck: no JVD, carotid bruits, or masses Cardiac: RRR; no murmurs, rubs, or gallops,no edema  Respiratory:  clear to auscultation bilaterally, normal work of breathing GI: soft, nontender, nondistended, + BS MS: no deformity or atrophy  Skin: warm and dry, no rash Neuro:  Alert and Oriented x 3, Strength and sensation are intact Psych: euthymic mood, full affect  Wt Readings from Last 3 Encounters:  01/15/18 140 lb (63.5 kg)  07/23/17 142 lb 6.4 oz (64.6 kg)  04/29/17 140 lb 6.4 oz (63.7 kg)      Studies/Labs Reviewed:   EKG:  EKG is ordered today.  The ekg ordered today demonstrates NSR at  85  Recent Labs: No results found for requested labs within last 8760 hours.   Lipid Panel    Component Value Date/Time   CHOL 206 (H) 11/16/2016 0000   TRIG 116 11/16/2016 0000   HDL 52 11/16/2016 0000   CHOLHDL 4.8 06/01/2016 1534   VLDL 32 (H) 06/01/2016 1534   LDLCALC 131 (H) 11/16/2016 0000   LDLDIRECT 140.0 07/21/2014 0839    Additional studies/ records that were reviewed today include:   As above   ASSESSMENT & PLAN:    1. HLD Takes zetia 10mg  qd. Last check in 2017. Will repeat study.   2. HTN - BP stable on HCTZ and lisinopril. Check BMET  3. Chronic diastolic CHF - evuvolemic. Continue HCTZ  4. Chest pain - Not typical of angina however has significant family hx of CAD and elevated cholesterol. ? Due to underlying stress/anxiety. Advise to establish care with PCP. Get coronary CTA.     Medication Adjustments/Labs and Tests Ordered: Current medicines are reviewed at length with the patient  today.  Concerns regarding medicines are outlined above.  Medication changes, Labs and Tests ordered today are listed in the Patient Instructions below. Patient Instructions  Medication Instructions:  1. Your physician recommends that you continue on your current medications as directed. Please refer to the Current Medication list given to you today.  If you need a refill on your cardiac medications before your next appointment, please call your pharmacy.   Lab work: YOU HAVE  BEEN GIVEN 2 PRESCRIPTIONS TO HAVE LAB WORK DONE; 1. FASTING LAB WORK (CMP, LIPIDS TO BE DONE IN NEXT WEEK)  2. BMET TO BE DONE 1 WEEK BEFORE YOUR CORONARY CT If you have labs (blood work) drawn today and your tests are completely normal, you will receive your results only by: Marland Kitchen MyChart Message (if you have MyChart) OR . A paper copy in the mail If you have any lab test that is abnormal or we need to change your treatment, we will call you to review the results.  Testing/Procedures: 1. YOU WILL NEED TO SCHEDULE A CORONARY CT W/FFR TO BE DONE AT [facility]  2. YOU WILL NEED TO BE SCHEDULED FOR A 14 DAY ZIO HEART MONITOR   Follow-Up: At [facility], you and your health needs are our priority.  As part of our continuing mission to provide you with exceptional heart care, we have created designated Provider Care Teams.  These Care Teams include your primary Cardiologist (physician) and Advanced Practice Providers (APPs -  Physician Assistants and Nurse Practitioners) who all work together to provide you with the care you need, when you need it. You will need a follow up appointment in:  1 years.  Please call our office 2 months in advance to schedule this appointment.  You may see DR. PAULA ROSS or one of the following Advanced Practice Providers on your designated Care Team: Richardson Dopp, PA-C [place], [place] . Daune Perch, NP  Any Other Special Instructions Will Be Listed Below (If Applicable).      Please arrive at the [facility] main entrance of [facility] at xx:xx AM (30-45 minutes prior to test start time)  [facility] [place], [place] [address] [phone]  Proceed to the [facility] Radiology Department (First Floor).  Please follow these instructions carefully (unless otherwise directed):   On the Night Before the Test: . Be sure to Drink plenty of water. . Do not consume any caffeinated/decaffeinated beverages or chocolate 12 hours prior to your test. . Do not take any antihistamines 12 hours prior to your test.  On the Day of the Test: . Drink plenty of water. Do not drink any water within one hour of the test. . Do not eat any food 4 hours prior to the test. . You may take your regular medications prior to the test.  . Take metoprolol (Lopressor) two hours prior to test.    *For Clinical Staff only. Please instruct patient the following:*        -Drink plenty of water       -Hold Furosemide/hydrochlorothiazide morning of the test       -Take metoprolol (Lopressor) 2 hours prior to test (if applicable).                  -If HR is less than 55 BPM- No Beta Blocker                 -IF HR is greater than 55 BPM and patient is less than or equal to 30 yrs old Lopressor 100mg  x1.                 -If HR is greater than 55 BPM and patient is greater than 15 yrs old Lopressor 50 mg x1.     Do not give Lopressor to patients with an allergy to lopressor or anyone with asthma or active COPD symptoms (currently taking steroids).  After the Test: . Drink plenty of water. . After receiving IV contrast, you may experience a mild flushed feeling. This is normal. . On occasion, you may experience a mild rash up to 24 hours after the test. This is not dangerous. If this occurs, you can take Benadryl 25 mg and increase your fluid intake. . If you experience trouble breathing, this can be serious. If it is severe call 911  IMMEDIATELY. If it is mild, please call our office. . If you take any of these medications: Glipizide/Metformin, Avandament, Glucavance, please do not take 48 hours after completing test.        Jarrett Soho, PA  04/24/2018 4:08 PM    [facility] [address], [place], [place]  [address] Phone: [phone]; Fax: [phone]

## 2018-04-24 NOTE — Patient Instructions (Addendum)
Medication Instructions:  1. Your physician recommends that you continue on your current medications as directed. Please refer to the Current Medication list given to you today.  If you need a refill on your cardiac medications before your next appointment, please call your pharmacy.   Lab work: YOU HAVE BEEN GIVEN 2 PRESCRIPTIONS TO HAVE LAB WORK DONE; 1. FASTING LAB WORK (CMP, LIPIDS TO BE DONE IN NEXT WEEK)  2. BMET TO BE DONE 1 WEEK BEFORE YOUR CORONARY CT If you have labs (blood work) drawn today and your tests are completely normal, you will receive your results only by: Marland Kitchen MyChart Message (if you have MyChart) OR . A paper copy in the mail If you have any lab test that is abnormal or we need to change your treatment, we will call you to review the results.  Testing/Procedures: 1. YOU WILL NEED TO SCHEDULE A CORONARY CT W/FFR TO BE DONE AT [facility]  2. YOU WILL NEED TO BE SCHEDULED FOR A 14 DAY ZIO HEART MONITOR   Follow-Up: At [facility], you and your health needs are our priority.  As part of our continuing mission to provide you with exceptional heart care, we have created designated Provider Care Teams.  These Care Teams include your primary Cardiologist (physician) and Advanced Practice Providers (APPs -  Physician Assistants and Nurse Practitioners) who all work together to provide you with the care you need, when you need it. You will need a follow up appointment in:  1 years.  Please call our office 2 months in advance to schedule this appointment.  You may see DR. PAULA ROSS or one of the following Advanced Practice Providers on your designated Care Team: Richardson Dopp, PA-C [place], [place] . Daune Perch, NP  Any Other Special Instructions Will Be Listed Below (If Applicable).     Please arrive at the [facility] main entrance of [facility] at xx:xx AM (30-45 minutes prior to test start time)  [facility] [place], [place] [address] [phone]  Proceed to the [facility] Radiology Department (First Floor).  Please follow these instructions carefully (unless otherwise directed):   On the Night Before the Test: . Be sure to Drink plenty of water. . Do not consume any caffeinated/decaffeinated beverages or chocolate 12 hours prior to your test. . Do not take any antihistamines 12 hours prior to your test.  On the Day of the Test: . Drink plenty of water. Do not drink any water within one hour of the test. . Do not eat any food 4 hours prior to the test. . You may take your regular medications prior to the test.  . Take metoprolol (Lopressor) two hours prior to test.    *For Clinical Staff only. Please instruct patient the following:*        -Drink plenty of water       -Hold Furosemide/hydrochlorothiazide morning of the test       -Take metoprolol (Lopressor) 2 hours prior to test (if applicable).                  -If HR is less than 55 BPM- No Beta Blocker                 -IF HR is greater than 55 BPM and patient is less than or equal to 52 yrs old Lopressor 100mg  x1.                 -  If HR is greater than 55 BPM and patient is greater than 52 yrs old Lopressor 50 mg x1.     Do not give Lopressor to patients with an allergy to lopressor or anyone with asthma or active COPD symptoms (currently taking steroids).       After the Test: . Drink plenty of water. . After receiving IV contrast, you may experience a mild flushed feeling. This is normal. . On occasion, you may experience a mild rash up to 24 hours after the test. This is not dangerous. If this occurs, you can take Benadryl 25 mg and increase your fluid intake. . If you experience trouble breathing, this can be serious. If it is severe call 911 IMMEDIATELY. If it is mild, please call our office. . If you take any of these medications: Glipizide/Metformin, Avandament, Glucavance, please do not take 48 hours after  completing test.

## 2018-04-24 NOTE — Telephone Encounter (Signed)
I spoke with Elmyra Ricks and told her dose is 2 of the 50 mg lopressor tablets prior to CT scan

## 2018-04-29 ENCOUNTER — Other Ambulatory Visit: Payer: Self-pay

## 2018-04-29 DIAGNOSIS — E785 Hyperlipidemia, unspecified: Secondary | ICD-10-CM

## 2018-04-29 DIAGNOSIS — Z8249 Family history of ischemic heart disease and other diseases of the circulatory system: Secondary | ICD-10-CM

## 2018-04-30 ENCOUNTER — Other Ambulatory Visit: Payer: BLUE CROSS/BLUE SHIELD

## 2018-04-30 ENCOUNTER — Ambulatory Visit (INDEPENDENT_AMBULATORY_CARE_PROVIDER_SITE_OTHER): Payer: BLUE CROSS/BLUE SHIELD

## 2018-04-30 DIAGNOSIS — E785 Hyperlipidemia, unspecified: Secondary | ICD-10-CM

## 2018-04-30 DIAGNOSIS — R002 Palpitations: Secondary | ICD-10-CM | POA: Diagnosis not present

## 2018-04-30 DIAGNOSIS — R079 Chest pain, unspecified: Secondary | ICD-10-CM

## 2018-04-30 DIAGNOSIS — Z8249 Family history of ischemic heart disease and other diseases of the circulatory system: Secondary | ICD-10-CM

## 2018-05-01 LAB — COMPREHENSIVE METABOLIC PANEL
ALT: 14 IU/L (ref 0–32)
AST: 16 IU/L (ref 0–40)
Albumin/Globulin Ratio: 1.9 (ref 1.2–2.2)
Albumin: 4.8 g/dL (ref 3.5–5.5)
Alkaline Phosphatase: 53 IU/L (ref 39–117)
BUN/Creatinine Ratio: 17 (ref 9–23)
BUN: 14 mg/dL (ref 6–24)
Bilirubin Total: 0.4 mg/dL (ref 0.0–1.2)
CO2: 26 mmol/L (ref 20–29)
Calcium: 9.7 mg/dL (ref 8.7–10.2)
Chloride: 101 mmol/L (ref 96–106)
Creatinine, Ser: 0.82 mg/dL (ref 0.57–1.00)
GFR calc Af Amer: 95 mL/min/{1.73_m2} (ref >59)
GFR calc non Af Amer: 83 mL/min/{1.73_m2} (ref >59)
Globulin, Total: 2.5 g/dL (ref 1.5–4.5)
Glucose: 90 mg/dL (ref 65–99)
Potassium: 4.1 mmol/L (ref 3.5–5.2)
Sodium: 143 mmol/L (ref 134–144)
Total Protein: 7.3 g/dL (ref 6.0–8.5)

## 2018-05-01 LAB — LIPID PANEL
Chol/HDL Ratio: 4.2 ratio (ref 0.0–4.4)
Cholesterol, Total: 216 mg/dL — ABNORMAL HIGH (ref 100–199)
HDL: 52 mg/dL (ref >39)
LDL Calculated: 140 mg/dL — ABNORMAL HIGH (ref 0–99)
Triglycerides: 122 mg/dL (ref 0–149)
VLDL Cholesterol Cal: 24 mg/dL (ref 5–40)

## 2018-05-08 ENCOUNTER — Ambulatory Visit: Payer: BLUE CROSS/BLUE SHIELD

## 2018-05-20 DIAGNOSIS — R002 Palpitations: Secondary | ICD-10-CM | POA: Diagnosis not present

## 2018-05-26 ENCOUNTER — Telehealth: Payer: Self-pay | Admitting: *Deleted

## 2018-05-26 ENCOUNTER — Telehealth: Payer: Self-pay | Admitting: Internal Medicine

## 2018-05-26 ENCOUNTER — Other Ambulatory Visit: Payer: Self-pay | Admitting: *Deleted

## 2018-05-26 MED ORDER — METOPROLOL TARTRATE 50 MG PO TABS: ORAL_TABLET | ORAL | 0 refills | Status: DC

## 2018-05-26 MED ORDER — ALPRAZOLAM 0.25 MG PO TABS: 0.2500 mg | ORAL_TABLET | ORAL | 0 refills | Status: DC | PRN

## 2018-05-26 NOTE — Telephone Encounter (Signed)
Xanax called to [facility] per below.

## 2018-05-26 NOTE — Telephone Encounter (Signed)
Short Rx for Xanax 0.25 mg    Fill ten.(10).

## 2018-05-26 NOTE — Telephone Encounter (Signed)
Called pt     1   Chest symptoms   Very unusual  She has significant tightness, radiation to L arm   Not really with acitvity but with mental stress   Not plans for CT angiogram   COntinue   Question if cut of CT can image GBP  2  Palpitations   Monitor showed short bursts of PAT   Pt may do better on a b blocker     3  Anxiety  WIll give short Rx f9r xanax. 0.25 mg prn

## 2018-05-26 NOTE — Telephone Encounter (Addendum)
Received call from patient who had been at [facility] to pick up metoprolol and was told they did not have it as an order for her.  Sent medication to [facility] - lopressor 50 mg (2) tablets for one hr prior to CT. Reviewed instructions with patient.  Advised she should not need to have another BMET prior, her BMET was normal at last OV.  Asked about possibility of Dr. Harrington Challenger refilling Xanax.  Adv she needs to acquire PCP.  She is arranging appointment with [place] PCP in [place].  Adv will review with Dr. Harrington Challenger.    Also inquired about event monitor results.   Took monitor off 3 days early because it was blistering her skin.  States did not have symptoms while she wore it, except her hands and feet are swollen.

## 2018-05-28 ENCOUNTER — Ambulatory Visit (HOSPITAL_COMMUNITY)
Admission: RE | Admit: 2018-05-28 | Discharge: 2018-05-28 | Disposition: A | Discharge status: Home / Self Care | Payer: BLUE CROSS/BLUE SHIELD | Source: Ambulatory Visit | Attending: Physician Assistant | Admitting: Physician Assistant

## 2018-05-28 ENCOUNTER — Ambulatory Visit (HOSPITAL_COMMUNITY): Admission: RE | Admit: 2018-05-28 | Payer: BLUE CROSS/BLUE SHIELD | Source: Ambulatory Visit

## 2018-05-28 DIAGNOSIS — E785 Hyperlipidemia, unspecified: Secondary | ICD-10-CM | POA: Diagnosis not present

## 2018-05-28 DIAGNOSIS — R079 Chest pain, unspecified: Secondary | ICD-10-CM | POA: Diagnosis not present

## 2018-05-28 DIAGNOSIS — Z8249 Family history of ischemic heart disease and other diseases of the circulatory system: Secondary | ICD-10-CM | POA: Diagnosis not present

## 2018-05-28 DIAGNOSIS — R002 Palpitations: Secondary | ICD-10-CM | POA: Diagnosis not present

## 2018-05-28 MED ORDER — IOPAMIDOL (ISOVUE-370) INJECTION 76%
100.0000 mL | Freq: Once | INTRAVENOUS | Status: AC | PRN
  Administered 2018-05-28: 100 mL via INTRAVENOUS

## 2018-05-28 MED ORDER — NITROGLYCERIN 0.4 MG SL SUBL
0.8000 mg | SUBLINGUAL_TABLET | Freq: Once | SUBLINGUAL | Status: AC
  Administered 2018-05-28: 0.8 mg via SUBLINGUAL
  Filled 2018-05-28: qty 25

## 2018-05-28 MED ORDER — NITROGLYCERIN 0.4 MG SL SUBL
SUBLINGUAL_TABLET | SUBLINGUAL | Status: AC
  Filled 2018-05-28: qty 2

## 2018-05-28 MED ORDER — METOPROLOL TARTRATE 5 MG/5ML IV SOLN
5.0000 mg | INTRAVENOUS | Status: DC | PRN
  Filled 2018-05-28: qty 5

## 2018-06-03 ENCOUNTER — Telehealth: Payer: Self-pay | Admitting: Internal Medicine

## 2018-06-03 NOTE — Telephone Encounter (Signed)
Called pt regarding CT   No evidence of coronary calcifications     Mlid aortic plaquing noted  PT says she slept with nose strip last night and she does not have arm/hand numbness   Plans to try again  Re palpitations:    Follow  With above changes    Usually she senses at night  Again, her breathing is good with activity   Recomm she stay active

## 2018-06-13 ENCOUNTER — Ambulatory Visit
Admission: RE | Admit: 2018-06-13 | Discharge: 2018-06-13 | Disposition: A | Discharge status: Home / Self Care | Payer: BLUE CROSS/BLUE SHIELD | Source: Ambulatory Visit | Attending: Obstetrics and Gynecology | Admitting: Obstetrics and Gynecology

## 2018-06-13 DIAGNOSIS — Z1231 Encounter for screening mammogram for malignant neoplasm of breast: Secondary | ICD-10-CM | POA: Diagnosis not present

## 2018-06-13 LAB — HM MAMMOGRAPHY

## 2018-06-18 HISTORY — PX: CYSTOSCOPY: SHX5120

## 2018-06-19 ENCOUNTER — Ambulatory Visit: Payer: BLUE CROSS/BLUE SHIELD | Admitting: Internal Medicine

## 2018-06-30 ENCOUNTER — Ambulatory Visit: Payer: BLUE CROSS/BLUE SHIELD | Admitting: Medical

## 2018-06-30 VITALS — BP 115/85 | HR 72 | Resp 16 | Wt 136.8 lb

## 2018-06-30 DIAGNOSIS — H00011 Hordeolum externum right upper eyelid: Secondary | ICD-10-CM

## 2018-06-30 DIAGNOSIS — L089 Local infection of the skin and subcutaneous tissue, unspecified: Secondary | ICD-10-CM

## 2018-06-30 MED ORDER — AMOXICILLIN-POT CLAVULANATE 875-125 MG PO TABS: 1.0000 | ORAL_TABLET | Freq: Two times a day (BID) | ORAL | 0 refills | Status: DC

## 2018-06-30 MED ORDER — CIPROFLOXACIN HCL 0.3 % OP SOLN: OPHTHALMIC | 0 refills | Status: DC

## 2018-06-30 NOTE — Progress Notes (Signed)
Subjective:    Patient ID: Molly Peterson, female    DOB: May 02, 1966, 53 y.o.   MRN: [id number]  HPI 53 yo female in non acute distress. 1.) Had right eye swelling when she woke up this morning l and tenderness on top of upper lid  last night with taking off her makeup last night. Using cold compresses but the swelling quickly returns. 2.) Was walking the dog  Last night and the dog pulled and leash wrapped around distal right 4th finger, patient has it buddy taped. Some bruising and swelling ,  "it throbs if the buddy tape is off."  Blood pressure 115/85, pulse 72, resp. rate 16, weight 136 lb 12.8 oz (62.1 kg), SpO2 100 %. Allergies  Allergen Reactions  . Latex Itching and Swelling  . Sulfa Antibiotics     Dizziness     Review of Systems  Constitutional: Negative for chills and fever.  HENT: Negative for congestion, ear discharge, ear pain and sore throat.   Eyes: Positive for pain, redness (mildy injected in sclera) and itching. Negative for photophobia, discharge and visual disturbance.  Respiratory: Negative.   Cardiovascular: Negative.   Gastrointestinal: Negative.   Endocrine: Negative.   Genitourinary: Negative.   Musculoskeletal: Negative.   Skin: Positive for color change (right ring finger bruised around cuticle and  base of nail, swelling of distal finger.).  Allergic/Immunologic: Negative.   Neurological: Negative.   Hematological: Negative.   Psychiatric/Behavioral: Negative.    2+ caprefill and  FROM of all fingers of right hand. Uses Lumify for dry eyes. Does mention she has tingling in hands in the morning when she wakes up. She then added in that she gets treated with HCTZ for tingling in her feet by her cardiologist and he also checks her blood work. But she has no primary doctor.     Objective:   Physical Exam Vitals signs and nursing note reviewed.  Constitutional:      Appearance: Normal appearance.  HENT:     Head: Normocephalic and  atraumatic.     Mouth/Throat:     Mouth: Mucous membranes are moist.  Eyes:     General: Lids are everted, no foreign bodies appreciated. Gaze aligned appropriately. No scleral icterus.       Right eye: Discharge present.        Left eye: No discharge.     Extraocular Movements: Extraocular movements intact.     Conjunctiva/sclera:     Right eye: Right conjunctiva is injected (mildly, no discharge).     Pupils: Pupils are equal, round, and reactive to light.   Neck:     Musculoskeletal: Neck supple.  Pulmonary:     Effort: Pulmonary effort is normal.  Musculoskeletal:        General: Swelling (right 4th finger distally) and tenderness ( right 4th finger distally) present. No deformity.  Lymphadenopathy:     Cervical: No cervical adenopathy.  Skin:    General: Skin is warm and dry.     Capillary Refill: Capillary refill takes less than 2 seconds.  Neurological:     General: No focal deficit present.     Mental Status: She is alert and oriented to person, place, and time.  Psychiatric:        Mood and Affect: Mood normal.        Behavior: Behavior normal.        Thought Content: Thought content normal.        Judgment: Judgment normal.  Assessment & Plan:  1.  Most likely sty upper right eye though I do not see an obvious sty.. Skin infection upper lid.  2.  Contusion/ Sprain to right distal 4th finger. Offerred pateint xray of finger but she declines due to the fact she is strained financially due to son getting out wisdom teeth.  She is to return in  2 days if finger is still painful and swollen, she agrees to this.  Buddy taped 3rd and 4th fingers of the right hand. Meds ordered this encounter  Medications  . amoxicillin-clavulanate (AUGMENTIN) 875-125 MG tablet    Sig: Take 1 tablet by mouth 2 (two) times daily.    Dispense:  20 tablet    Refill:  0  . ciprofloxacin (CILOXAN) 0.3 % ophthalmic solution    Sig: Administer 1 drop, every 4 hours to the right eye,  while awake, for the next 5-7  days.    Dispense:  5 mL    Refill:  0     History of Paresthesia of hands and feet 2 month history , has no primary doctor.  Given [facility] Phys[facility] referral line number/ information. She says she was late for her primary appointment at [facility] on [address] , they would not see her. The doctor had called her and apologized but she states she was mad at them so she did not make another appointment. Or she could call them  Back and make a primary care appointment. I discussed with patient that she needs to get a primary doctor and have some blood work done. She verbalizes understanding and has no questions at discharge. She is to apply warm compresses to the right eye lip and I also reviewed for her to  return in  24-48 hours if the right upper lid is worsening and/or  not improving. Again she verbalizes understanding and has no questions.

## 2018-06-30 NOTE — Patient Instructions (Addendum)
Contusion  A contusion is a deep bruise. Contusions happen when an injury causes bleeding under the skin. Symptoms of bruising include pain, swelling, and discolored skin. The skin may turn blue, purple, or yellow. Follow these instructions at home:  Rest the injured area.  If told, put ice on the injured area. ? Put ice in a plastic bag. ? Place a towel between your skin and the bag. ? Leave the ice on for 20 minutes, 2-3 times per day.  If told, put light pressure (compression) on the injured area using an elastic bandage. Make sure the bandage is not too tight. Remove it and put it back on as told by your doctor.  If possible, raise (elevate) the injured area above the level of your heart while you are sitting or lying down.  Take over-the-counter and prescription medicines only as told by your doctor. Contact a doctor if:  Your symptoms do not get better after several days of treatment.  Your symptoms get worse.  You have trouble moving the injured area. Get help right away if:  You have very bad pain.  You have a loss of feeling (numbness) in a hand or foot.  Your hand or foot turns pale or cold. This information is not intended to replace advice given to you by your health care provider. Make sure you discuss any questions you have with your health care provider. Document Released: 11/21/2007 Document Revised: 11/10/2015 Document Reviewed: 10/20/2014 Elsevier Interactive Patient Education  2019 [place], also known as a hordeolum, is a bump that forms on an eyelid. It may look like a pimple next to the eyelash. A stye can form inside the eyelid (internal stye) or outside the eyelid (external stye). A stye can cause redness, swelling, and pain on the eyelid. Styes are very common. Anyone can get them at any age. They usually occur in just one eye, but you may have more than one in either eye. What are the causes? A stye is caused by an infection. The  infection is almost always caused by bacteria called Staphylococcus aureus. This is a common type of bacteria that lives on the skin. An internal stye may result from an infected oil-producing gland inside the eyelid. An external stye may be caused by an infection at the base of the eyelash (hair follicle). What increases the risk? You are more likely to develop a stye if:  You have had a stye before.  You have any of these conditions: ? Diabetes. ? Red, itchy, inflamed eyelids (blepharitis). ? A skin condition such as seborrheic dermatitis or rosacea. ? High fat levels in your blood (lipids). What are the signs or symptoms? The most common symptom of a stye is eyelid pain. Internal styes are more painful than external styes. Other symptoms may include:  Painful swelling of your eyelid.  A scratchy feeling in your eye.  Tearing and redness of your eye.  Pus draining from the stye. How is this diagnosed? Your health care provider may be able to diagnose a stye just by examining your eye. The health care provider may also check to make sure:  You do not have a fever or other signs of a more serious infection.  The infection has not spread to other parts of your eye or areas around your eye. How is this treated? Most styes will clear up in a few days without treatment or with warm compresses applied to the area. You may need  to use antibiotic drops or ointment to treat an infection. In some cases, if your stye does not heal with routine treatment, your health care provider may drain pus from the stye using a thin blade or needle. This may be done if the stye is large, causing a lot of pain, or affecting your vision. Follow these instructions at home:  Take over-the-counter and prescription medicines only as told by your health care provider. This includes eye drops or ointments.  If you were prescribed an antibiotic medicine, apply or use it as told by your health care provider. Do  not stop using the antibiotic even if your condition improves.  Apply a warm, wet cloth (warm compress) to your eye for 5-10 minutes, 4 times a day.  Clean the affected eyelid as directed by your health care provider.  Do not wear contact lenses or eye makeup until your stye has healed.  Do not try to pop or drain the stye.  Do not rub your eye. Contact a health care provider if:  You have chills or a fever.  Your stye does not go away after several days.  Your stye affects your vision.  Your eyeball becomes swollen, red, or painful. Get help right away if:  You have pain when moving your eye around. Summary  A stye is a bump that forms on an eyelid. It may look like a pimple next to the eyelash.  A stye can form inside the eyelid (internal stye) or outside the eyelid (external stye). A stye can cause redness, swelling, and pain on the eyelid.  Your health care provider may be able to diagnose a stye just by examining your eye.  Apply a warm, wet cloth (warm compress) to your eye for 5-10 minutes, 4 times a day. This information is not intended to replace advice given to you by your health care provider. Make sure you discuss any questions you have with your health care provider. Document Released: 03/14/2005 Document Revised: 02/14/2017 Document Reviewed: 02/14/2017 Elsevier Interactive Patient Education  2019 [employer].

## 2018-07-02 ENCOUNTER — Telehealth: Payer: Self-pay

## 2018-07-02 NOTE — Telephone Encounter (Signed)
Follow up call- patient states that her eye is doing much better.  The swelling has improved and itching has decreased after starting the drops.  Patient will follow up with [place] with any further problems.

## 2018-07-03 DIAGNOSIS — L738 Other specified follicular disorders: Secondary | ICD-10-CM | POA: Diagnosis not present

## 2018-07-03 DIAGNOSIS — L570 Actinic keratosis: Secondary | ICD-10-CM | POA: Diagnosis not present

## 2018-07-03 DIAGNOSIS — L814 Other melanin hyperpigmentation: Secondary | ICD-10-CM | POA: Diagnosis not present

## 2018-07-03 DIAGNOSIS — D485 Neoplasm of uncertain behavior of skin: Secondary | ICD-10-CM | POA: Diagnosis not present

## 2018-07-03 DIAGNOSIS — L821 Other seborrheic keratosis: Secondary | ICD-10-CM | POA: Diagnosis not present

## 2018-07-07 DIAGNOSIS — H0011 Chalazion right upper eyelid: Secondary | ICD-10-CM | POA: Diagnosis not present

## 2018-07-23 ENCOUNTER — Other Ambulatory Visit: Payer: Self-pay | Admitting: Internal Medicine

## 2018-07-23 MED ORDER — LISINOPRIL 10 MG PO TABS: 10.0000 mg | ORAL_TABLET | Freq: Every day | ORAL | 3 refills | Status: DC

## 2018-08-05 ENCOUNTER — Other Ambulatory Visit: Payer: Self-pay | Admitting: Internal Medicine

## 2018-08-05 MED ORDER — HYDROCHLOROTHIAZIDE 25 MG PO TABS: ORAL_TABLET | ORAL | 2 refills | Status: DC

## 2018-08-05 NOTE — Telephone Encounter (Signed)
°*  STAT* If patient is at the pharmacy, call can be transferred to refill team.   1. Which medications need to be refilled? (please list name of each medication and dose if known)  hydrochlorothiazide (HYDRODIURIL) 25 MG tablet  2. Which pharmacy/location (including street and city if local pharmacy) is medication to be sent to?  [facility] [address] [facility] [place]  3. Do they need a 30 day or 90 day supply? 90    PT IS OUT OF MEDICATION

## 2018-08-21 DIAGNOSIS — L570 Actinic keratosis: Secondary | ICD-10-CM | POA: Diagnosis not present

## 2018-08-21 DIAGNOSIS — I788 Other diseases of capillaries: Secondary | ICD-10-CM | POA: Diagnosis not present

## 2018-08-21 DIAGNOSIS — D485 Neoplasm of uncertain behavior of skin: Secondary | ICD-10-CM | POA: Diagnosis not present

## 2018-08-21 DIAGNOSIS — D1800 Hemangioma unspecified site: Secondary | ICD-10-CM | POA: Diagnosis not present

## 2018-08-21 DIAGNOSIS — Z1283 Encounter for screening for malignant neoplasm of skin: Secondary | ICD-10-CM | POA: Diagnosis not present

## 2018-08-25 ENCOUNTER — Encounter: Payer: Self-pay | Admitting: Family Medicine

## 2018-08-25 ENCOUNTER — Ambulatory Visit (INDEPENDENT_AMBULATORY_CARE_PROVIDER_SITE_OTHER): Payer: BLUE CROSS/BLUE SHIELD | Admitting: Family Medicine

## 2018-08-25 VITALS — BP 124/82 | HR 67 | Temp 97.3°F | Ht 62.75 in | Wt 138.5 lb

## 2018-08-25 DIAGNOSIS — M549 Dorsalgia, unspecified: Secondary | ICD-10-CM

## 2018-08-25 DIAGNOSIS — Z131 Encounter for screening for diabetes mellitus: Secondary | ICD-10-CM | POA: Diagnosis not present

## 2018-08-25 DIAGNOSIS — M545 Low back pain: Secondary | ICD-10-CM

## 2018-08-25 DIAGNOSIS — R200 Anesthesia of skin: Secondary | ICD-10-CM | POA: Insufficient documentation

## 2018-08-25 DIAGNOSIS — R202 Paresthesia of skin: Secondary | ICD-10-CM

## 2018-08-25 DIAGNOSIS — G8929 Other chronic pain: Secondary | ICD-10-CM | POA: Insufficient documentation

## 2018-08-25 NOTE — Patient Instructions (Signed)
Numbness  - referral to neurology for nerve studies  Labs today   Consider therapy

## 2018-08-25 NOTE — Addendum Note (Signed)
Addended by: Ellamae Sia on: 08/25/2018 02:13 PM   Modules accepted: Orders

## 2018-08-25 NOTE — Assessment & Plan Note (Signed)
Pt with 1 year of numbness primarily while sleeping primarily. No sign of carpal tunnel or medial tunnel on exam. Given duration of symptoms referral to neurology for consideration for EMG. Also concerned about swelling, which may be arthritis and does endorse some hand pain. Will start with neurology evaluation and pending results consider further work-up for arthritis.

## 2018-08-25 NOTE — Assessment & Plan Note (Signed)
Pt notes some chronic back pain which may be associated with the b/l foot numbness. And foot numbness is improved with home stretching. Does not provide hx that points towards stenosis and foot exam normal w/o disc pathology on exam. Continue to monitor and consider imaging if foot symptoms worsening
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
## 2018-09-01 ENCOUNTER — Other Ambulatory Visit: Payer: Self-pay

## 2018-09-01 MED ORDER — EZETIMIBE 10 MG PO TABS: 10.0000 mg | ORAL_TABLET | Freq: Every day | ORAL | 1 refills | Status: DC

## 2018-09-01 NOTE — Telephone Encounter (Signed)
Okay to refill? 

## 2018-10-08 ENCOUNTER — Telehealth: Payer: Self-pay | Admitting: Internal Medicine

## 2018-10-08 NOTE — Telephone Encounter (Signed)
Patient has  2 concerns:   She takes lisinopril.  Should she be concerned because she has some risk factors for having complications from covid 19 if she gets it, and she takes lisinopril.  I adv that at this time we are not changing patient's medications based on potentially getting the virus.    Also she said in regard to years worth of hands and feet swelling, turning red, and hands falling asleep multiple times during the day and night - her father is a Retail [employer] patient.  He was taking lisinopril and had the same symptoms and his symptoms stopped when his lisinopril was stopped.   I advised her I did not think lisinopril is the cause of these symptoms.  She has discussed these symptoms several times over the years with Dr. Harrington Challenger.  They have been happening since 2006 when she had hysterectomy and then started on lisinopril.  Pt aware I am forwarding to Dr. Harrington Challenger and PharmD and I will call her back if they make any further recommendations.

## 2018-10-08 NOTE — Telephone Encounter (Signed)
Pt has a question about her LISINOPRIL  10MG   Please give her a call back.   Pt c/o medication issue:  1. Name of Medication: LISINOPRIL 10 mg  2. How are you currently taking this medication (dosage and times per day)? 1 d  3. Are you having a reaction (difficulty breathing--STAT)? Pt stated has quesions  4. What is your medication issue? questions

## 2018-10-08 NOTE — Telephone Encounter (Signed)
Called pt    Will try stopping lisinopril Start aldactone 25 mg   Will get at [facility] in [place] lab slip to her for BMET   She will get labs in 7 to 10 days at health service at [facility]

## 2018-10-08 NOTE — Telephone Encounter (Signed)
Agree COVID concern is not a reason to stop ACEi therapy and symptoms she is experiencing are likely unrelated to her lisinopril. However, she does not have an indication for ACEi therapy other than HTN. She could trial either amlodipine 5mg  daily or spironolactone 25mg  daily for BP to see if her symptoms resolve off of lisinopril. If they do not, would be fine to resume lisinopril.

## 2018-10-09 MED ORDER — SPIRONOLACTONE 25 MG PO TABS: 25.0000 mg | ORAL_TABLET | Freq: Every day | ORAL | 3 refills | Status: DC

## 2018-10-09 NOTE — Telephone Encounter (Signed)
dc'd lisinopril' Sent prescription for aldactone 25 mg to [facility]. Will discuss with RN at office to mail prescription for BMET to patient.

## 2018-10-09 NOTE — Addendum Note (Signed)
Addended by: Rodman Key on: 10/09/2018 05:06 PM   Modules accepted: Orders

## 2018-10-22 ENCOUNTER — Telehealth: Payer: Self-pay | Admitting: Gastroenterology

## 2018-10-22 NOTE — Telephone Encounter (Signed)
The pt states she has increasing constipation and lower back pain that is relieved with a BM.  She was scheduled for an appt with Dr Fuller Plan on 5/11 at 830 am.

## 2018-10-27 ENCOUNTER — Encounter: Payer: Self-pay | Admitting: Gastroenterology

## 2018-10-27 ENCOUNTER — Other Ambulatory Visit: Payer: Self-pay

## 2018-10-27 ENCOUNTER — Ambulatory Visit (INDEPENDENT_AMBULATORY_CARE_PROVIDER_SITE_OTHER): Payer: BLUE CROSS/BLUE SHIELD | Admitting: Gastroenterology

## 2018-10-27 VITALS — Ht 62.0 in | Wt 132.0 lb

## 2018-10-27 DIAGNOSIS — G8929 Other chronic pain: Secondary | ICD-10-CM | POA: Diagnosis not present

## 2018-10-27 DIAGNOSIS — Z86010 Personal history of colonic polyps: Secondary | ICD-10-CM

## 2018-10-27 DIAGNOSIS — M545 Low back pain, unspecified: Secondary | ICD-10-CM

## 2018-10-27 DIAGNOSIS — K5900 Constipation, unspecified: Secondary | ICD-10-CM

## 2018-10-27 NOTE — Progress Notes (Signed)
    History of Present Illness: This is a 53 year old female with constipation.  She relates frequent episodes of constipation with 3 to 4 days between bowel movements.  At about the 3rd-4th day she develops worsening low midline back pain without radiation of the pain.  She states she has had chronic back pain since 2006 that has been evaluated without a clear etiology found.  Constipation clearly exacerbates her back pain and is relieved with a bowel movement.  She follows a high-fiber diet, drinks adequate amounts of fluids daily and takes prunes on a daily basis.  When she develops constipation she takes Colace which is effective.  She also notes that since she has been home her stools have been softer and less formed when they move regularly and this alternates with constipation as above.  She underwent colonoscopy in November 2017 for routine screening that showed a 7 mm sessile serrated polyp and mixed hemorrhoids. Denies weight loss, abdominal pain, diarrhea, change in stool caliber, melena, hematochezia, nausea, vomiting, dysphagia, reflux symptoms, chest pain.   Current Medications, Allergies, Past Medical History, Past Surgical History, Family History and Social History were reviewed in [employer] record.   Physical Exam: Telemedicine - not performed   Assessment and Recommendations:  1.  Intermittent constipation associated with low midline back pain.  Change in bowel habits.  Low midline back pain is relieved with a bowel movement.  Begin Colace daily along with a high-fiber diet and adequate daily water intake. She is advised to target for a complete bowel movement every day or every other day.  If above regimen  is not fully effective begin MiraLAX daily as needed.  Contact us in the next 2-3 weeks if her symptoms are not under very good control. Consider CT AP and colonoscopy if symptoms persist.   2.  Personal history of a sessile serrated polyp.  A 5-year  interval surveillance colonoscopy is recommended in November 2022.   These services were provided via telemedicine, audio and video.  The patient was at home and the provider was in the office, alone.  We discussed the limitations of evaluation and management by telemedicine and the availability of in person appointments.  Patient consented for this telemedicine visit and is aware of possible charges for this service.  The other person participating in the telemedicine service was [name], [place] who reviewed medications, allergies, past history and completed AVS.  Time spent on call: 10 minutes

## 2018-10-27 NOTE — Patient Instructions (Signed)
Start over the counter Colace daily.   Increase your water intake.   Start a high-fiber diet below.   If your symptoms are not better then start Miralax daily as needed.   Call our office back in 2-3 weeks if your symptoms are not under better control.  High-Fiber Diet Fiber, also called dietary fiber, is a type of carbohydrate that is found in fruits, vegetables, whole grains, and beans. A high-fiber diet can have many health benefits. Your health care provider may recommend a high-fiber diet to help:  Prevent constipation. Fiber can make your bowel movements more regular.  Lower your cholesterol.  Relieve the following conditions: ? Swelling of veins in the anus (hemorrhoids). ? Swelling and irritation (inflammation) of specific areas of the digestive tract (uncomplicated diverticulosis). ? A problem of the large intestine (colon) that sometimes causes pain and diarrhea (irritable bowel syndrome, IBS).  Prevent overeating as part of a weight-loss plan.  Prevent heart disease, type 2 diabetes, and certain cancers. What is my plan? The recommended daily fiber intake in grams (g) includes:  38 g for men age 22 or younger.  30 g for men over age 29.  52 g for women age 26 or younger.  21 g for women over age 72. You can get the recommended daily intake of dietary fiber by:  Eating a variety of fruits, vegetables, grains, and beans.  Taking a fiber supplement, if it is not possible to get enough fiber through your diet. What do I need to know about a high-fiber diet?  It is better to get fiber through food sources rather than from fiber supplements. There is not a lot of research about how effective supplements are.  Always check the fiber content on the nutrition facts label of any prepackaged food. Look for foods that contain 5 g of fiber or more per serving.  Talk with a diet and nutrition specialist (dietitian) if you have questions about specific foods that are  recommended or not recommended for your medical condition, especially if those foods are not listed below.  Gradually increase how much fiber you consume. If you increase your intake of dietary fiber too quickly, you may have bloating, cramping, or gas.  Drink plenty of water. Water helps you to digest fiber. What are tips for following this plan?  Eat a wide variety of high-fiber foods.  Make sure that half of the grains that you eat each day are whole grains.  Eat breads and cereals that are made with whole-grain flour instead of refined flour or white flour.  Eat brown rice, bulgur wheat, or millet instead of white rice.  Start the day with a breakfast that is high in fiber, such as a cereal that contains 5 g of fiber or more per serving.  Use beans in place of meat in soups, salads, and pasta dishes.  Eat high-fiber snacks, such as berries, raw vegetables, nuts, and popcorn.  Choose whole fruits and vegetables instead of processed forms like juice or sauce. What foods can I eat?  Fruits Berries. Pears. Apples. Oranges. Avocado. Prunes and raisins. Dried figs. Vegetables Sweet potatoes. Spinach. Kale. Artichokes. Cabbage. Broccoli. Cauliflower. Green peas. Carrots. Squash. Grains Whole-grain breads. Multigrain cereal. Oats and oatmeal. Brown rice. Barley. Bulgur wheat. Royal. Quinoa. Bran muffins. Popcorn. Rye wafer crackers. Meats and other proteins Navy, kidney, and pinto beans. Soybeans. Split peas. Lentils. Nuts and seeds. Dairy Fiber-fortified yogurt. Beverages Fiber-fortified soy milk. Fiber-fortified orange juice. Other foods Fiber bars. The  items listed above may not be a complete list of recommended foods and beverages. Contact a dietitian for more options. What foods are not recommended? Fruits Fruit juice. Cooked, strained fruit. Vegetables Fried potatoes. Canned vegetables. Well-cooked vegetables. Grains White bread. Pasta made with refined flour. White  rice. Meats and other proteins Fatty cuts of meat. Fried chicken or fried fish. Dairy Milk. Yogurt. Cream cheese. Sour cream. Fats and oils Butters. Beverages Soft drinks. Other foods Cakes and pastries. The items listed above may not be a complete list of foods and beverages to avoid. Contact a dietitian for more information. Summary  Fiber is a type of carbohydrate. It is found in fruits, vegetables, whole grains, and beans.  There are many health benefits of eating a high-fiber diet, such as preventing constipation, lowering blood cholesterol, helping with weight loss, and reducing your risk of heart disease, diabetes, and certain cancers.  Gradually increase your intake of fiber. Increasing too fast can result in cramping, bloating, and gas. Drink plenty of water while you increase your fiber.  The best sources of fiber include whole fruits and vegetables, whole grains, nuts, seeds, and beans. This information is not intended to replace advice given to you by your health care provider. Make sure you discuss any questions you have with your health care provider. Document Released: 06/04/2005 Document Revised: 04/08/2017 Document Reviewed: 04/08/2017 Elsevier Interactive Patient Education  2019 [employer].

## 2018-10-28 DIAGNOSIS — N952 Postmenopausal atrophic vaginitis: Secondary | ICD-10-CM | POA: Diagnosis not present

## 2018-12-05 DIAGNOSIS — L821 Other seborrheic keratosis: Secondary | ICD-10-CM | POA: Diagnosis not present

## 2018-12-05 DIAGNOSIS — L570 Actinic keratosis: Secondary | ICD-10-CM | POA: Diagnosis not present

## 2018-12-29 ENCOUNTER — Telehealth: Payer: Self-pay | Admitting: *Deleted

## 2018-12-29 ENCOUNTER — Other Ambulatory Visit: Payer: BLUE CROSS/BLUE SHIELD

## 2018-12-29 ENCOUNTER — Telehealth: Payer: Self-pay | Admitting: Family Medicine

## 2018-12-29 DIAGNOSIS — R6889 Other general symptoms and signs: Secondary | ICD-10-CM | POA: Diagnosis not present

## 2018-12-29 DIAGNOSIS — Z20822 Contact with and (suspected) exposure to covid-19: Secondary | ICD-10-CM

## 2018-12-29 NOTE — Telephone Encounter (Signed)
Spoke with patient, scheduled her for COVID 19 test today at 3pm at [facility].  Testing protocol reviewed.

## 2018-12-29 NOTE — Telephone Encounter (Signed)
Will send referral for [facility] due to symptoms and exposure.   At this time, rapid testing only available for patients requiring hospitalization.   Will take 7-10 days for results.    [facility] also [facility], though suspect the wait period is the same.   She should get a phone call from [facility].

## 2018-12-29 NOTE — Telephone Encounter (Signed)
Patient advised. Patient is concerned with not been able to help her parents while waiting for days for the results. Patient was frustrated. I apologized for the inconvinience of the time it takes to get results.

## 2018-12-29 NOTE — Telephone Encounter (Signed)
Patient called today about covid symptoms  she is having. Very Bad Sore Throat and Headaches.  She also found out today that she has been exposed  to someone who tested positive for COVID .  She would like to see if an order could be put in for her to be tested.   Patient became very upset that she has called 3 different places and none of them do testing in their office.,  Patient did not understand that some places have rapid testing and it comes back in 15 mins but the last three places told her it could be 7-10 days due to some many people being tested for this.   Patient declined any virtual visits because she stated she did not understand why she should pay just to speak to someone for a test to be done  Please Advise  C/B #  [phone]

## 2018-12-29 NOTE — Telephone Encounter (Signed)
-----   Message from [name], MD sent at 12/29/2018  2:09 PM EDT ----- Cough and Sore throat +covid exposure  Please test

## 2018-12-31 ENCOUNTER — Other Ambulatory Visit: Payer: Self-pay | Admitting: Internal Medicine

## 2019-01-02 LAB — NOVEL CORONAVIRUS, NAA: SARS-CoV-2, NAA: NOT DETECTED

## 2019-01-15 DIAGNOSIS — K589 Irritable bowel syndrome without diarrhea: Secondary | ICD-10-CM | POA: Diagnosis not present

## 2019-01-15 DIAGNOSIS — R102 Pelvic and perineal pain: Secondary | ICD-10-CM | POA: Diagnosis not present

## 2019-01-19 ENCOUNTER — Telehealth: Payer: Self-pay | Admitting: Gastroenterology

## 2019-01-19 DIAGNOSIS — R109 Unspecified abdominal pain: Secondary | ICD-10-CM

## 2019-01-19 NOTE — Telephone Encounter (Signed)
Patient reports intermittent severe abdominal pain over the last week.  Pain can last up to 3 hours.  She thought this was an ovarian cyst rupture.  Saw GYN and [place] was negative.  She has intermittent severe lower abdominal cramping pain that is low in her pelvis, pain also radiates to her tailbone. Her GYN prescribed hyoscyamine .375 mg.  Helped minimally.  Had an attack this am that lasted approximately 30 minutes and is now gone.  "I now feel perfectly fine".  She had a BM this am prior to the pain.  She denies fever, rectal bleeding, constipation, or diarrhea.  She has been having normal daily BM.  Has had about 4 episodes over the last week.  Your last office note discussed CT scan for continued constipation.  Please advise.

## 2019-01-19 NOTE — Telephone Encounter (Signed)
Patient notified of recommendations CT scheduled for 01/23/19 2:00 at [facility] She wants to have her labs done at [facility].  She will come here tomorrow and pick up the instructions and the orders for the labs.

## 2019-01-19 NOTE — Telephone Encounter (Signed)
Please see my May 2020 office note and stress the importance of appropriately managing her constipation  Continue hysocyanine as prescribed Schedule CT AP

## 2019-01-20 ENCOUNTER — Other Ambulatory Visit: Payer: Self-pay | Admitting: Gastroenterology

## 2019-01-20 ENCOUNTER — Other Ambulatory Visit: Payer: BLUE CROSS/BLUE SHIELD

## 2019-01-20 ENCOUNTER — Other Ambulatory Visit: Payer: Self-pay

## 2019-01-20 DIAGNOSIS — R109 Unspecified abdominal pain: Secondary | ICD-10-CM

## 2019-01-21 LAB — BUN: BUN: 18 mg/dL (ref 6–24)

## 2019-01-23 ENCOUNTER — Other Ambulatory Visit: Payer: Self-pay

## 2019-01-23 ENCOUNTER — Inpatient Hospital Stay: Admission: RE | Admit: 2019-01-23 | Payer: BC Managed Care – PPO | Source: Ambulatory Visit

## 2019-01-23 DIAGNOSIS — R109 Unspecified abdominal pain: Secondary | ICD-10-CM

## 2019-01-26 ENCOUNTER — Ambulatory Visit (INDEPENDENT_AMBULATORY_CARE_PROVIDER_SITE_OTHER)
Admission: RE | Admit: 2019-01-26 | Discharge: 2019-01-26 | Disposition: A | Discharge status: Home / Self Care | Payer: BC Managed Care – PPO | Source: Ambulatory Visit | Attending: Gastroenterology | Admitting: Gastroenterology

## 2019-01-26 ENCOUNTER — Other Ambulatory Visit: Payer: Self-pay

## 2019-01-26 DIAGNOSIS — K5730 Diverticulosis of large intestine without perforation or abscess without bleeding: Secondary | ICD-10-CM | POA: Diagnosis not present

## 2019-01-26 DIAGNOSIS — R109 Unspecified abdominal pain: Secondary | ICD-10-CM | POA: Diagnosis not present

## 2019-01-26 DIAGNOSIS — N201 Calculus of ureter: Secondary | ICD-10-CM | POA: Diagnosis not present

## 2019-01-26 MED ORDER — IOHEXOL 300 MG/ML  SOLN
100.0000 mL | Freq: Once | INTRAMUSCULAR | Status: AC | PRN
  Administered 2019-01-26: 100 mL via INTRAVENOUS

## 2019-01-29 DIAGNOSIS — N201 Calculus of ureter: Secondary | ICD-10-CM | POA: Diagnosis not present

## 2019-01-29 DIAGNOSIS — N23 Unspecified renal colic: Secondary | ICD-10-CM | POA: Diagnosis not present

## 2019-01-29 LAB — CREATININE, SERUM
Creatinine, Ser: 1 mg/dL (ref 0.57–1.00)
GFR calc Af Amer: 74 mL/min/{1.73_m2} (ref >59)
GFR calc non Af Amer: 64 mL/min/{1.73_m2} (ref >59)

## 2019-01-29 LAB — SPECIMEN STATUS REPORT

## 2019-01-30 DIAGNOSIS — N201 Calculus of ureter: Secondary | ICD-10-CM | POA: Diagnosis not present

## 2019-02-02 DIAGNOSIS — M545 Low back pain: Secondary | ICD-10-CM | POA: Diagnosis not present

## 2019-02-02 DIAGNOSIS — R310 Gross hematuria: Secondary | ICD-10-CM | POA: Diagnosis not present

## 2019-02-02 DIAGNOSIS — N201 Calculus of ureter: Secondary | ICD-10-CM | POA: Diagnosis not present

## 2019-02-06 DIAGNOSIS — N201 Calculus of ureter: Secondary | ICD-10-CM | POA: Diagnosis not present

## 2019-02-19 ENCOUNTER — Telehealth: Payer: Self-pay | Admitting: Internal Medicine

## 2019-02-19 NOTE — Telephone Encounter (Signed)
New message   Pt c/o medication issue:  1. Name of Medication: spironolactone (ALDACTONE) 25 MG tablet  2. How are you currently taking this medication (dosage and times per day)?  1 time daily  3. Are you having a reaction (difficulty breathing--STAT)? No   4. What is your medication issue? Patient would like to know if this medication has a diuretic in it? Patient states that she already is taking a diuretic. Does she need both of these medications? Please call.

## 2019-02-20 ENCOUNTER — Telehealth: Payer: Self-pay | Admitting: Internal Medicine

## 2019-02-20 DIAGNOSIS — Z79899 Other long term (current) drug therapy: Secondary | ICD-10-CM

## 2019-02-20 NOTE — Addendum Note (Signed)
Addended by: Frederik Schmidt on: 02/20/2019 02:40 PM   Modules accepted: Orders

## 2019-02-20 NOTE — Telephone Encounter (Signed)
I spoke to the patient and she will come in Tuesday for BMET.

## 2019-02-20 NOTE — Telephone Encounter (Signed)
Pt called to clarify meds  She had blood work Artist) done at [facility] in the spring but I never received results Needs BMET drawn   She can come next week   Please order and call her to confirm timnig

## 2019-02-23 NOTE — Telephone Encounter (Signed)
Pt contacted   REviewed meds  Pt will be set up for BMET

## 2019-02-24 ENCOUNTER — Other Ambulatory Visit: Payer: Self-pay

## 2019-02-24 ENCOUNTER — Other Ambulatory Visit: Payer: BC Managed Care – PPO

## 2019-02-24 DIAGNOSIS — Z79899 Other long term (current) drug therapy: Secondary | ICD-10-CM | POA: Diagnosis not present

## 2019-02-24 LAB — BASIC METABOLIC PANEL
BUN/Creatinine Ratio: 22 (ref 9–23)
BUN: 19 mg/dL (ref 6–24)
CO2: 27 mmol/L (ref 20–29)
Calcium: 10.1 mg/dL (ref 8.7–10.2)
Chloride: 97 mmol/L (ref 96–106)
Creatinine, Ser: 0.86 mg/dL (ref 0.57–1.00)
GFR calc Af Amer: 89 mL/min/{1.73_m2} (ref >59)
GFR calc non Af Amer: 77 mL/min/{1.73_m2} (ref >59)
Glucose: 89 mg/dL (ref 65–99)
Potassium: 4 mmol/L (ref 3.5–5.2)
Sodium: 137 mmol/L (ref 134–144)

## 2019-03-02 ENCOUNTER — Telehealth: Payer: Self-pay

## 2019-03-02 ENCOUNTER — Other Ambulatory Visit: Payer: Self-pay

## 2019-03-02 DIAGNOSIS — Z20822 Contact with and (suspected) exposure to covid-19: Secondary | ICD-10-CM

## 2019-03-02 DIAGNOSIS — R6889 Other general symptoms and signs: Secondary | ICD-10-CM | POA: Diagnosis not present

## 2019-03-02 NOTE — Telephone Encounter (Signed)
Has a scratchy throat that she says comes from wearing her mask.  Her son called over the weekend and said he had loss of taste and smell. It seems to come and go. He goes to [facility] and they have multiple cases.   She was with him on Thursday 02-26-19. She has no symptoms. She is not allowed to go back to work.   She will go get tested at [facility] today.   I advised her to have her results sent to [facility] since Dr Einar Pheasant is out on maternity leave.  Sending to Anda Kraft to let her know.

## 2019-03-02 NOTE — Telephone Encounter (Signed)
Noted, patient tested earlier today, await results.

## 2019-03-03 ENCOUNTER — Telehealth: Payer: Self-pay | Admitting: Internal Medicine

## 2019-03-03 LAB — NOVEL CORONAVIRUS, NAA: SARS-CoV-2, NAA: NOT DETECTED

## 2019-03-03 NOTE — Telephone Encounter (Signed)
New message   Patient has questions about lab work. Please call.

## 2019-03-03 NOTE — Telephone Encounter (Signed)
Call back received from Pt.  Discussed lab results.  All questions answered.

## 2019-03-03 NOTE — Telephone Encounter (Signed)
Returned call to Pt.  Went to VM.  Left detailed message per DPR.  Advised all lab work was WNL.  Advised to call back if any questions.

## 2019-03-05 NOTE — Telephone Encounter (Signed)
Pt c/o medication issue:  1. Name of Medication: Hydrochlorothiazide and Spironolactone*  2. How are you currently taking this medication (dosage and times per day)?  Both 1 time a day  3. Are you having a reaction (difficulty breathing--STAT)?no   4. What is your medication issue?  Just left her eye doctor- she was told her glands in her eyes are completely dry- he thinks both of the medicine is causing  this

## 2019-03-05 NOTE — Telephone Encounter (Signed)
Spoke with patient. Eye doctor concerned about the glands in eyes being completely dried out.  Per pt expressed that taking hctz and spironolactone are likely the reason. She reports still has swelling despite taking both medicines. I adv we switched her off lisinopril to spironolactone in April because she had concerns about COVID. Pt states this was not the reason. She stated it was because her hands and feet were falling asleep and it was discussed that possibly lisinopril could have been the cause.  That issue with hands and feet falling asleep has resolved off lisinopril.  Pt tells me today that the problem was that she was taking lisinopril at night instead of the morning.  She states that she is not concerned about COVID and lisinopril.  She would be willing to switch back to lisinopril 10 mg and stop spironolactone if that is what Dr. Harrington Challenger recommends. She would need an updated prescription.

## 2019-03-06 ENCOUNTER — Telehealth: Payer: Self-pay

## 2019-03-06 NOTE — Telephone Encounter (Signed)
Called pt   She was seen by ophthy.   Eyes dry.  May be because on 2 diuretics   BUN/CR OK Recomm:   Stop HCTZ    Continue aldactone    (patient had been on lisinopril prior)  Try Toprol XL 50 mg   Start with 1/2 tab   FOllow bP and HR  INcrease to 50 mg if BP high [facility]   Will call in AM

## 2019-03-06 NOTE — Telephone Encounter (Signed)
error 

## 2019-03-09 MED ORDER — METOPROLOL SUCCINATE ER 50 MG PO TB24: 50.0000 mg | ORAL_TABLET | Freq: Every day | ORAL | 3 refills | Status: DC

## 2019-03-09 NOTE — Telephone Encounter (Signed)
Updated medication list. Discontinued hctz and added Toprol XL 50 mg to medicine list. Pt will monitor BP and HR on Toprol XL 25 mg (1/2 tab). Pt scheduled with Dr. Harrington Challenger next week.

## 2019-03-11 ENCOUNTER — Other Ambulatory Visit: Payer: Self-pay

## 2019-03-11 ENCOUNTER — Ambulatory Visit: Payer: Self-pay

## 2019-03-19 NOTE — Progress Notes (Deleted)
Cardiology Office Note   Date:  03/19/2019   ID:  Molly Peterson, DOB [birth year], MRN [id number]  PCP:  [name], MD  Cardiologist:   Dorris Carnes, MD   F/U of HTN      History of Present Illness: Molly Peterson is a 53 y.o. female with a history of HTN, palpitations and HL  I saw her in December 2017  [facility] of CAD   The pt says that she has noticed some fluttering in her chest  Episodes will last about 1 min  Will wake her from sleep   She has also noted she "gasps' at times  Not associated eith activitiy  Began in May or June  She wonders if related to [place]. She denies CP Does says that she does get SOB when she walks across campus  No worse  She was drinking more caffeine but has pulled  Back   Now drinking more juice    Outpatient Medications Prior to Visit  Medication Sig Dispense Refill  . ALPRAZolam (XANAX) 0.25 MG tablet Take 1 tablet (0.25 mg total) by mouth as needed for anxiety. 10 tablet 0  . ezetimibe (ZETIA) 10 MG tablet Take 1 tablet (10 mg total) by mouth daily. Please make yearly appt with Dr. Harrington Challenger for November for future refills. 1st attempt 90 tablet 0  . Loratadine 10 MG CAPS Take as needed by mouth.     . metoprolol succinate (TOPROL-XL) 50 MG 24 hr tablet Take 1 tablet (50 mg total) by mouth daily. Take with or immediately following a meal. 90 tablet 3  . spironolactone (ALDACTONE) 25 MG tablet Take 1 tablet (25 mg total) by mouth daily. 90 tablet 3   Facility-Administered Medications Prior to Visit  Medication Dose Route Frequency Provider Last Rate Last Dose  . 0.9 %  sodium chloride infusion  500 mL Intravenous Continuous Ladene Artist, MD         Allergies:   Latex and Sulfa antibiotics   Past Medical History:  Diagnosis Date  . Acute pharyngitis   . Allergy   . ANXIETY 09/18/2006  . DEPRESSION 09/18/2006  . DERMATITIS, SEBORRHEIC NEC 01/06/2007  . Edema    symptom  . Flatulence, eructation, and gas pain   . Hyperlipidemia   .  HYPERTENSION 09/18/2006  . Palpitations 09/18/2006  . REACTION, ACUTE STRESS W/EMOTIONAL DSTURB 12/23/2006  . Unspecified constipation   . Urinary tract infection, site not specified     Past Surgical History:  Procedure Laterality Date  . ABDOMINAL HYSTERECTOMY     partial-still has ovaries  . APPENDECTOMY  unknown  . Caesarean     x2  . CESAREAN SECTION    . TONSILLECTOMY  unknown     Social History:  The patient  reports that she quit smoking about 14 years ago. Her smoking use included cigarettes. She has a 4.00 pack-year smoking history. She has never used smokeless tobacco. She reports current alcohol use. She reports that she does not use drugs.   Family History:  The patient's family history includes Asthma in her father; Atrial fibrillation (age of onset: 61) in her mother; Dementia in her maternal grandmother; Depression in her son; Hearing loss in her father; Heart attack in her maternal grandfather and paternal grandfather; Heart attack (age of onset: 31) in her father; Heart disease in her father and mother; Heart failure in her maternal grandfather; Hyperlipidemia in her father; Hypertension in her father; Kidney cancer in her  father; Stroke (age of onset: 18) in her paternal grandmother.    ROS:  Please see the history of present illness. All other systems are reviewed and  Negative to the above problem except as noted.    PHYSICAL EXAM: VS:  There were no vitals taken for this visit.  GEN: Well nourished, well developed, in no acute distress  HEENT: normal  Neck: JVP normal  No, carotid bruits, or masses Cardiac: RRR; no murmurs, rubs, or gallops,no edema  Respiratory:  clear to auscultation bilaterally, normal work of breathing GI: soft, nontender, nondistended, + BS  No hepatomegaly  MS: no deformity Moving all extremities   Skin: warm and dry, no rash Neuro:  Strength and sensation are intact Psych: euthymic mood, full affect   EKG:  EKG is t ordered today.  SR  77 bpm   Lipid Panel    Component Value Date/Time   CHOL 216 (H) 04/30/2018 0812   TRIG 122 04/30/2018 0812   HDL 52 04/30/2018 0812   CHOLHDL 4.2 04/30/2018 0812   CHOLHDL 4.8 06/01/2016 1534   VLDL 32 (H) 06/01/2016 1534   LDLCALC 140 (H) 04/30/2018 0812   LDLDIRECT 140.0 07/21/2014 0839      Wt Readings from Last 3 Encounters:  10/27/18 132 lb (59.9 kg)  08/25/18 138 lb 8 oz (62.8 kg)  06/30/18 136 lb 12.8 oz (62.1 kg)      ASSESSMENT AND PLAN:  1  HTN  BP is good  2  Dyspnea  Will set up for echo  To eval diastolic functoin and atrial size  3  HL  Will set up for fasting lipids  4  "gasps"  Not clear what this is   I do not think cardiac  Consider stopping zetia after labs drawn  5  Fluttering.  Cut back on caffeine and carrbs  Get echo   Not heimodynamically destabilizing  Encouraged her to follow rates on I watch    2  HL  Discussed with pharmacy  Will check lipids (nonfasting)today  Then get Zetia to [facility] in [place] for $30/month   F/U lipids in 3 months   3  Plapitations  Pt denies    F?U in 1 year   Stay active     Current medicines are reviewed at length with the patient today.  The patient does not have concerns regarding medicines.  Signed, Dorris Carnes, MD  03/19/2019 8:33 PM    [facility] [place], [place],   [address] Phone: [phone]; Fax: [phone]

## 2019-03-20 ENCOUNTER — Ambulatory Visit: Payer: BC Managed Care – PPO | Admitting: Internal Medicine

## 2019-04-14 ENCOUNTER — Other Ambulatory Visit: Payer: Self-pay

## 2019-04-14 ENCOUNTER — Ambulatory Visit (INDEPENDENT_AMBULATORY_CARE_PROVIDER_SITE_OTHER): Payer: BC Managed Care – PPO | Admitting: Family Medicine

## 2019-04-14 ENCOUNTER — Telehealth: Payer: Self-pay | Admitting: Internal Medicine

## 2019-04-14 ENCOUNTER — Encounter: Payer: Self-pay | Admitting: Family Medicine

## 2019-04-14 VITALS — BP 123/86 | HR 71 | Ht 62.75 in | Wt 135.0 lb

## 2019-04-14 DIAGNOSIS — R413 Other amnesia: Secondary | ICD-10-CM

## 2019-04-14 DIAGNOSIS — F419 Anxiety disorder, unspecified: Secondary | ICD-10-CM | POA: Diagnosis not present

## 2019-04-14 DIAGNOSIS — G8929 Other chronic pain: Secondary | ICD-10-CM

## 2019-04-14 DIAGNOSIS — M545 Low back pain, unspecified: Secondary | ICD-10-CM

## 2019-04-14 MED ORDER — ALPRAZOLAM 0.25 MG PO TABS: 0.2500 mg | ORAL_TABLET | ORAL | 0 refills | Status: DC | PRN

## 2019-04-14 NOTE — Patient Instructions (Signed)
How to help anxiety - without medication.   1) Regular Exercise - walking, jogging, cycling, dancing, strength training  2)  Begin a Mindfulness/Meditation practice -- this can take a little as 3 minutes and is helpful for all kinds of mood issues -- You can find resources in books -- Or you can download apps like  ---- Headspace App (which currently has free content called "Weathering the Storm") ---- Calm (which has a few free options)  ---- Insignt Timer ---- Stop, Breathe & Think  # With each of these Apps - you should decline the "start free trial" offer and as you search through the App should be able to access some of their free content. You can also chose to pay for the content if you find one that works well for you.   # Many of them also offer sleep specific content which may help with insomnia  3) Healthy Diet -- Avoid or decrease Caffeine -- Avoid or decrease Alcohol -- Drink plenty of water, have a balanced diet -- Avoid cigarettes and marijuana (as well as other recreational drugs)  4) Consider contacting a professional therapist   5) Take Xanax as needed if other interventions are not working

## 2019-04-14 NOTE — Assessment & Plan Note (Signed)
Advised in office visit for exam and consideration for imaging. Will reassess at next office visit

## 2019-04-14 NOTE — Assessment & Plan Note (Addendum)
Previously managed with occasional xanax and given intermittent symptoms this seems reasonable. Will continue to monitor and assess. Refill provided. Discussed need for contract at next appointment

## 2019-04-14 NOTE — Telephone Encounter (Signed)
Pt c/o BP issue: STAT if pt c/o blurred vision, one-sided weakness or slurred speech  1. What are your last 5 BP readings?  Yesterday it was  In the 194/96 180/92, 14/88 was last night and today 140/87 and 132/84 over the nine- pulse rate yesterday  was in the fifties ` 2. Are you having any other symptoms (ex. Dizziness, headache, blurred vision, passed out)? No energy, not feeling like herself yesterday  3. What is your BP issue? Running high- made appt for  Pt on 04-28-19

## 2019-04-14 NOTE — Progress Notes (Signed)
I connected with [name] on 04/14/19 at 10:40 AM EDT by video and verified that I am speaking with the correct person using two identifiers.   I discussed the limitations, risks, security and privacy concerns of performing an evaluation and management service by video and the availability of in person appointments. I also discussed with the patient that there may be a patient responsible charge related to this service. The patient expressed understanding and agreed to proceed.  Patient location: Home Provider Location: [place] Participants: [name] and [name]   Subjective:     Molly Peterson is a 53 y.o. female presenting for Follow-up (after seen Dr Fuller Plan) and Medication Management (discuss Xanax)     HPI   #Constipation #Back pain - planning to f/u colonoscopy - still having back pain   #Cardiology - has f/u planned  #anxiety - not sure who initially started this  - either cardiology or GYN - husband and she traveled a lot  - had anxiety about being alone - in the house on her own - would get 30 pills which would last 1-2 years - cannot recall the last time she took them - always took 1/2 pill - is used to being by herself and less major anxiety now - is noticing more symptoms now - having general fear of the unknown now - husband with health symptoms and job loss - walking 6 miles daily - coloring at work to help reduce stress  Having some word-finding issues and worried about her memory - not sure if it sleep  - snoring more than before - more energy in the morning - does not fall asleep during the day - worse recently, getting less sleep   Chart Review:  10/27/2018: Dr. Fuller Plan - constipation and low back pain - advised colace and high fiber diet and increased water intake. Add miralax prn   Review of Systems  Psychiatric/Behavioral: Positive for sleep disturbance. The patient is nervous/anxious.        Memory  loss     Social History   Tobacco Use  Smoking Status Former Smoker  . Packs/day: 0.25  . Years: 16.00  . Pack years: 4.00  . Types: Cigarettes  . Quit date: 06/18/2004  . Years since quitting: 14.8  Smokeless Tobacco Never Used        Objective:   BP Readings from Last 3 Encounters:  04/14/19 123/86  08/25/18 124/82  06/30/18 115/85   Wt Readings from Last 3 Encounters:  04/14/19 135 lb (61.2 kg)  10/27/18 132 lb (59.9 kg)  08/25/18 138 lb 8 oz (62.8 kg)    BP 123/86 Comment: per patient  Pulse 71 Comment: per patient  Ht 5' 2.75" (1.594 m)   Wt 135 lb (61.2 kg) Comment: per patient  BMI 24.11 kg/m   Physical Exam Constitutional:      Appearance: Normal appearance. She is not ill-appearing.  HENT:     Head: Normocephalic and atraumatic.     Right Ear: External ear normal.     Left Ear: External ear normal.  Eyes:     Conjunctiva/sclera: Conjunctivae normal.  Pulmonary:     Effort: Pulmonary effort is normal. No respiratory distress.  Neurological:     Mental Status: She is alert. Mental status is at baseline.  Psychiatric:        Mood and Affect: Mood normal.        Behavior: Behavior normal.  Thought Content: Thought content normal.        Judgment: Judgment normal.          Assessment & Plan:   Problem List Items Addressed This Visit      Other   Anxiety - Primary    Previously managed with occasional xanax and given intermittent symptoms this seems reasonable. Will continue to monitor and assess. Refill provided. Discussed need for contract at next appointment      Relevant Medications   ALPRAZolam (XANAX) 0.25 MG tablet   Chronic back pain    Advised in office visit for exam and consideration for imaging. Will reassess at next office visit      Memory changes    Difficulty with serial 7s and only recalled 2/3 words. Will plan for return visit for more extensive memory testing and lab work-up. Also with sleep issues so may need to  get sleep apnea work-up to see if this is contributing          Return in about 2 weeks (around 04/28/2019) for back and memory work-up.  [name], MD

## 2019-04-14 NOTE — Telephone Encounter (Signed)
Pt had virtual visit today with PCP for anxiety. BP better there. Will route to Dr. Harrington Challenger to make aware and to determine if ok to wait until 11/10 for appointment.

## 2019-04-14 NOTE — Assessment & Plan Note (Signed)
Difficulty with serial 7s and only recalled 2/3 words. Will plan for return visit for more extensive memory testing and lab work-up. Also with sleep issues so may need to get sleep apnea work-up to see if this is contributing

## 2019-04-16 ENCOUNTER — Other Ambulatory Visit: Payer: Self-pay | Admitting: Internal Medicine

## 2019-04-16 NOTE — Telephone Encounter (Signed)
OK for pt to wait DO I have any openings? NOte on 11/7 (Fri) I am out but can see some

## 2019-04-17 NOTE — Telephone Encounter (Signed)
Reviewed with Dr. Harrington Challenger.  She will be able to see patient Friday Nov 13. Will call patient to offer reschedule.

## 2019-04-21 NOTE — Telephone Encounter (Signed)
Spoke with patient. Changed her appointment to 05/01/19.  She reports has been having problems with BP although the last time she checked it was Saturday and it was good then.  Also breathing hard when walking across her work campus with mask on and this makes her uncomfortable.  Will discuss with Dr. Harrington Challenger next week.

## 2019-04-28 ENCOUNTER — Ambulatory Visit: Payer: BC Managed Care – PPO | Admitting: Physician Assistant

## 2019-04-28 DIAGNOSIS — Z6824 Body mass index (BMI) 24.0-24.9, adult: Secondary | ICD-10-CM | POA: Diagnosis not present

## 2019-04-28 DIAGNOSIS — Z13820 Encounter for screening for osteoporosis: Secondary | ICD-10-CM | POA: Diagnosis not present

## 2019-04-28 DIAGNOSIS — M810 Age-related osteoporosis without current pathological fracture: Secondary | ICD-10-CM | POA: Diagnosis not present

## 2019-04-28 DIAGNOSIS — Z01419 Encounter for gynecological examination (general) (routine) without abnormal findings: Secondary | ICD-10-CM | POA: Diagnosis not present

## 2019-05-01 ENCOUNTER — Other Ambulatory Visit: Payer: Self-pay

## 2019-05-01 ENCOUNTER — Encounter: Payer: Self-pay | Admitting: Internal Medicine

## 2019-05-01 ENCOUNTER — Ambulatory Visit (INDEPENDENT_AMBULATORY_CARE_PROVIDER_SITE_OTHER): Payer: BC Managed Care – PPO | Admitting: Internal Medicine

## 2019-05-01 VITALS — BP 122/78 | HR 70 | Ht 62.75 in | Wt 140.0 lb

## 2019-05-01 DIAGNOSIS — I10 Essential (primary) hypertension: Secondary | ICD-10-CM

## 2019-05-01 NOTE — Patient Instructions (Signed)
Medication Instructions:  No changes *If you need a refill on your cardiac medications before your next appointment, please call your pharmacy*  Lab Work: Per script given to you today -bmet, lipids with results to be faxed to Dr. Harrington Challenger  If you have labs (blood work) drawn today and your tests are completely normal, you will receive your results only by: Marland Kitchen MyChart Message (if you have MyChart) OR . A paper copy in the mail If you have any lab test that is abnormal or we need to change your treatment, we will call you to review the results.  Testing/Procedures: None ordered  Follow-Up: At [facility], you and your health needs are our priority.  As part of our continuing mission to provide you with exceptional heart care, we have created designated Provider Care Teams.  These Care Teams include your primary Cardiologist (physician) and Advanced Practice Providers (APPs -  Physician Assistants and Nurse Practitioners) who all work together to provide you with the care you need, when you need it.  Your next appointment:   9 months  The format for your next appointment:   In Person  Provider:   Dorris Carnes, MD  Other Instructions

## 2019-05-01 NOTE — Progress Notes (Signed)
.    Cardiology Office Note   Date:  05/01/2019   ID:  Molly Peterson, DOB 1965/08/31, MRN [id number]  PCP:  [name], MD  Cardiologist:   Dorris Carnes, MD   F/U of HTN     History of Present Illness: Molly Peterson is a 53 y.o. female with a history of HTN, palpitations, chest pressure,  diastolic dysfunction, HL>  Bobbye Riggs has a FFHx of CAD  Echo in 2018 LVEF 60 to 65% with Gr I diastolic dysfunction.    The pt CT of    No coronary calcdifcations noted but minimal aortic plaquing   She was last seen in cardiology clinic in Nov 2019   IN April the called in because of concerns being on lisinoprol   COmplaine d of swelling in hands, feet.   Hand fell asleep   Concerned about lisinopril use esp with COVID   SHe was taken off of lisinopril and placed on aldactone    Seen by ophthy in fall   Told was on 2 diuretics   She called back   Labs had been ok    REcomm stopping HCT and continuing aldacone    ALos Toprol XL  added     The pt forgot Toprol 1 day on 10/27   BP was extremely high   Resumed and BP better  She denies CP   She doees complain of dyspnea with walking and with stairs.   NOte she is wearing a mask when she is SOB  Walks 6 miles per day   Wt down       Current Meds  Medication Sig  . ALPRAZolam (XANAX) 0.25 MG tablet Take 1 tablet (0.25 mg total) by mouth as needed for anxiety.  Marland Kitchen ezetimibe (ZETIA) 10 MG tablet Take 1 tablet (10 mg total) by mouth daily.  . Loratadine 10 MG CAPS Take by mouth daily.   . metoprolol succinate (TOPROL-XL) 50 MG 24 hr tablet Take 1 tablet (50 mg total) by mouth daily. Take with or immediately following a meal.  . spironolactone (ALDACTONE) 25 MG tablet Take 1 tablet (25 mg total) by mouth daily.   Current Facility-Administered Medications for the 05/01/19 encounter (Office Visit) with Fay Records, MD  Medication  . 0.9 %  sodium chloride infusion     Allergies:   Latex and Sulfa antibiotics   Past Medical History:   Diagnosis Date  . Acute pharyngitis   . Allergy   . ANXIETY 09/18/2006  . DEPRESSION 09/18/2006  . DERMATITIS, SEBORRHEIC NEC 01/06/2007  . Edema    symptom  . Flatulence, eructation, and gas pain   . Hyperlipidemia   . HYPERTENSION 09/18/2006  . Palpitations 09/18/2006  . REACTION, ACUTE STRESS W/EMOTIONAL DSTURB 12/23/2006  . Unspecified constipation   . Urinary tract infection, site not specified     Past Surgical History:  Procedure Laterality Date  . ABDOMINAL HYSTERECTOMY     partial-still has ovaries  . APPENDECTOMY  unknown  . Caesarean     x2  . CESAREAN SECTION    . TONSILLECTOMY  unknown     Social History:  The patient  reports that she quit smoking about 14 years ago. Her smoking use included cigarettes. She has a 4.00 pack-year smoking history. She has never used smokeless tobacco. She reports current alcohol use. She reports that she does not use drugs.   Family History:  The patient's family history includes Asthma in her father; Atrial  fibrillation (age of onset: 85) in her mother; Dementia in her maternal grandmother; Depression in her son; Hearing loss in her father; Heart attack in her maternal grandfather and paternal grandfather; Heart attack (age of onset: 64) in her father; Heart disease in her father and mother; Heart failure in her maternal grandfather; Hyperlipidemia in her father; Hypertension in her father; Kidney cancer in her father; Stroke (age of onset: 60) in her paternal grandmother.    ROS:  Please see the history of present illness. All other systems are reviewed and  Negative to the above problem except as noted.    PHYSICAL EXAM: VS:  BP 122/78   Pulse 70   Ht 5' 2.75" (1.594 m)   Wt 140 lb (63.5 kg)   BMI 25.00 kg/m   GEN: Well nourished, well developed, in no acute distress  HEENT: normal  Neck: no JVD, carotid bruits, or masses Cardiac: RRR; no murmurs, rubs, or gallops,no edema  Respiratory:  clear to auscultation bilaterally, normal  work of breathing GI: soft, nontender, nondistended, + BS  No hepatomegaly  MS: no deformity Moving all extremities   Skin: warm and dry, no rash Neuro:  Strength and sensation are intact Psych: euthymic mood, full affect   EKG:  EKG is ordered today.  SR 70     Lipid Panel    Component Value Date/Time   CHOL 216 (H) 04/30/2018 0812   TRIG 122 04/30/2018 0812   HDL 52 04/30/2018 0812   CHOLHDL 4.2 04/30/2018 0812   CHOLHDL 4.8 06/01/2016 1534   VLDL 32 (H) 06/01/2016 1534   LDLCALC 140 (H) 04/30/2018 0812   LDLDIRECT 140.0 07/21/2014 0839      Wt Readings from Last 3 Encounters:  05/01/19 140 lb (63.5 kg)  04/14/19 135 lb (61.2 kg)  10/27/18 132 lb (59.9 kg)      ASSESSMENT AND PLAN:  1  HTN   BP is good on current regimen   Follow  2  Dyspnea.   I am not convinced there is a signif problems   I encouraged her t owalk without a mask and see how she feels      3  PAD   Reviewed CT with pt   Her Calcium score was 0 but she has minimal plaquing in aorta    Will get lipids (fasting   4  Lipids   Would get fasting  Rx aggressively given FHx     F/U in clinic in 1 year   Current medicines are reviewed at length with the patient today.  The patient does not have concerns regarding medicines.  Signed, Dorris Carnes, MD  05/01/2019 2:03 PM    [address], [place], [place]  [address] Phone: [phone]; Fax: [phone]

## 2019-05-11 ENCOUNTER — Other Ambulatory Visit: Payer: Self-pay | Admitting: Obstetrics and Gynecology

## 2019-05-11 DIAGNOSIS — Z1231 Encounter for screening mammogram for malignant neoplasm of breast: Secondary | ICD-10-CM

## 2019-05-13 ENCOUNTER — Telehealth: Payer: Self-pay | Admitting: Gastroenterology

## 2019-05-13 NOTE — Telephone Encounter (Signed)
Patient is due for recall in 2022.  She is interested in having a colonoscopy that she and Dr. Fuller Plan discussed back in May.  It was a consideration if her symptoms did not resolve.  She will come in on 05/25/19 with Carl Best to discyss

## 2019-05-24 NOTE — Progress Notes (Signed)
05/24/2019 Molly Peterson [id number] 1966/06/11   History of Present Illness:  Molly Peterson. Wach is a 53 year old female with a past medical history of anxiety, depression, hypertension, chronic back pain and constipation . Partial hysterectomy.  Kidney stone surgery.  She presents today with complaints of a change in bowel pattern.  She describes passing and explosive mound of soft stool once or twice daily for the past 2 months.  She denies having any watery diarrhea.  No rectal bleeding.  No melena.  She eats a high-fiber diet.  She eats prunes daily.  She intermittently takes a stool softener if she feels constipated.  Approximately 3 months ago, she was took an antibiotic daily for 30 days due to having a right and left eye chalazion.  She has intermittent pain left of the sacral area which occurs once or twice monthly.  During these times of sacral pain she does not pass a BM for 3 days.  She takes a stool softener which results in passing a bowel movement.  She denies having an MRI of her lumbar sacral spine.  No history of fecal or urinary incontinence.  No history of back surgery.  She underwent a colonoscopy by Dr. Fuller Peterson 05/15/2016 which identified 1 sessile serrated poly, internal and external hemorrhoids.  She was advised to repeat a colonoscopy November 2022. An abdominal/pelvic CT 01/26/2019 completed due abdominal pain identified a left kidney stone which resulted in kidney stone surgery.  The abdominal/pelvic CT also noted a 5 mm gallbladder polyp or gallstone and partial pancreatic divisum.  Abdominal/pelvic CT with contrast 01/26/2019: 1. 5 by 5 by 3 mm left proximal ureteral calculus associated with borderline left hydronephrosis, but with contrast medium extending distal to the stone on delayed images. 2. 7 by 5 by 5 mm non-dependent filling defect in the gallbladder on image 23/2. This is most likely a polyp, less likely adherent gallstone. 3.  Aortic Atherosclerosis  (ICD10-I70.0). 4. Mild sigmoid colon diverticulosis. 5. At least partial pancreas divisum.   Colonoscopy 05/15/2016 by Dr. Fuller Peterson: - One 7 mm sessile serrated polyp in the transverse colon, removed with a cold snare.  - External and internal hemorrhoids. - The examination was otherwise normal on direct and retroflexion views. - Repeat colonoscopy 5 years.  Current Outpatient Medications on File Prior to Visit  Medication Sig Dispense Refill  . ALPRAZolam (XANAX) 0.25 MG tablet Take 1 tablet (0.25 mg total) by mouth as needed for anxiety. 30 tablet 0  . ezetimibe (ZETIA) 10 MG tablet Take 1 tablet (10 mg total) by mouth daily. 90 tablet 0  . Loratadine 10 MG CAPS Take by mouth daily.     . metoprolol succinate (TOPROL-XL) 50 MG 24 hr tablet Take 1 tablet (50 mg total) by mouth daily. Take with or immediately following a meal. 90 tablet 3  . spironolactone (ALDACTONE) 25 MG tablet Take 1 tablet (25 mg total) by mouth daily. 90 tablet 3   Current Facility-Administered Medications on File Prior to Visit  Medication Dose Route Frequency Provider Last Rate Last Dose  . 0.9 %  sodium chloride infusion  500 mL Intravenous Continuous Ladene Artist, MD       Allergies  Allergen Reactions  . Latex Itching and Swelling  . Sulfa Antibiotics     Dizziness    Current Medications, Allergies, Past Medical History, Past Surgical History, Family History and Social History were reviewed in [employer] record.   Physical Exam:  BP 110/84   Pulse 75   Temp 97.6 F (36.4 C)   Ht 5' 2.75" (1.594 m)   Wt 140 lb (63.5 kg)   BMI 25.00 kg/m   General: Well developed  53 year old female in no acute distress Head: Normocephalic and atraumatic Eyes:  Sclerae anicteric, conjunctiva pink  Ears: Normal auditory acuity Lungs: Clear throughout to auscultation Heart: Regular rate and rhythm Abdomen: Soft, non tender and non distended. No masses, no hepatomegaly. Normal bowel sounds  Rectal: Deferred Musculoskeletal: Symmetrical with no gross deformities  Extremities: No edema  Neurological: Alert oriented x 4, grossly nonfocal Psychological:  Alert and cooperative. Normal mood and affect  Assessment and Recommendations:  43. 53 year old female with altered bowel pattern, previously constipated, now passing soft to loose stool which occurred after taking an antibiotic for 30 days for bilateral chalazion infection.  -Florastor probiotic 1 p.o. twice daily.  Bacteria probiotic of choice once daily. -Celiac Panel  -CRP -Follow-up in the office in 6 weeks  2. History of colon polyp -Next colonoscopy dueNovember/2022, consider an earlier diagnostic colonoscopy if symptoms persist or worsen -See Peterson in #1  3. Gallbladder polyp versus gallstone per CTAP 01/2019 -Abdominal sonogram   4.  Pancreatic divisum per CTAP 01/2019, no history of pancreatitis

## 2019-05-25 ENCOUNTER — Ambulatory Visit (INDEPENDENT_AMBULATORY_CARE_PROVIDER_SITE_OTHER): Payer: BC Managed Care – PPO | Admitting: Nurse Practitioner

## 2019-05-25 ENCOUNTER — Encounter: Payer: Self-pay | Admitting: Nurse Practitioner

## 2019-05-25 ENCOUNTER — Telehealth: Payer: Self-pay

## 2019-05-25 VITALS — BP 110/84 | HR 75 | Temp 97.6°F | Ht 62.75 in | Wt 140.0 lb

## 2019-05-25 DIAGNOSIS — R194 Change in bowel habit: Secondary | ICD-10-CM

## 2019-05-25 DIAGNOSIS — K824 Cholesterolosis of gallbladder: Secondary | ICD-10-CM | POA: Diagnosis not present

## 2019-05-25 NOTE — Telephone Encounter (Signed)
Pt was seen on 03/2019 and told to schedule in-person visit to discuss and further evaluate issues that we were unable to address during virtual visit due to time.   Unable to see CPX notes.   Would recommend office visit to discuss  Was referred to neurology in 08/2018 but pt did not schedule.   Routing to MA to see if referral is still active and provide pt with Neurology number if available.

## 2019-05-25 NOTE — Telephone Encounter (Signed)
Pt left v/m that pt was seen for soreness and swelling in hands and joints are hurting and going to sleep. Pt had recent yearly CPX with another provider and pt was advised to ck with PCP about doing lab testing for arthritis. At 08/25/18 visit to establish care it was noted would start with neurology referral and then pending those results would consider further work up for arthritis.  I could not see where pt had a neurology referral or consult.Please advise. Pt request cb.

## 2019-05-25 NOTE — Patient Instructions (Addendum)
If you are age 53 or older, your body mass index should be between 23-30. Your Body mass index is 25 kg/m. If this is out of the aforementioned range listed, please consider follow up with your Primary Care Provider.  If you are age 72 or younger, your body mass index should be between 19-25. Your Body mass index is 25 kg/m. If this is out of the aformentioned range listed, please consider follow up with your Primary Care Provider.   You have been scheduled for an abdominal ultrasound at [facility] (1st floor of hospital) on 05/29/2019 at 10:00. Please arrive 15 minutes prior to your appointment for registration. Make certain not to have anything to eat or drink 6 hours prior to your appointment. Should you need to reschedule your appointment, please contact radiology at [phone]. This test typically takes about 30 minutes to perform.   Your provider has requested that you go to the basement level for lab work before leaving today. Press "B" on the elevator. The lab is located at the first door on the left as you exit the elevator.  Please start using the following: 1.  Florastor probiotic 1 tablet twice daily. 2. Hardin Negus Bacteria probiotic 1 daily for 2-3 months  Follow up with Dr Fuller Plan in 6 weeks.  Thank you for choosing me and [facility]

## 2019-05-25 NOTE — Telephone Encounter (Signed)
Left detailed message on patient's phone, ok per DPR on file

## 2019-05-26 NOTE — Progress Notes (Signed)
Reviewed and agree with management plan.  Srijan Givan T. Federico Maiorino, MD FACG [facility]  

## 2019-05-26 NOTE — Telephone Encounter (Signed)
I sent mychart message to the patient today.

## 2019-05-27 ENCOUNTER — Other Ambulatory Visit: Payer: Self-pay | Admitting: *Deleted

## 2019-05-27 DIAGNOSIS — I10 Essential (primary) hypertension: Secondary | ICD-10-CM

## 2019-05-27 DIAGNOSIS — R194 Change in bowel habit: Secondary | ICD-10-CM

## 2019-05-27 DIAGNOSIS — K824 Cholesterolosis of gallbladder: Secondary | ICD-10-CM

## 2019-05-27 DIAGNOSIS — E785 Hyperlipidemia, unspecified: Secondary | ICD-10-CM

## 2019-05-27 NOTE — Telephone Encounter (Signed)
Appointment made for 05/28/2019

## 2019-05-28 ENCOUNTER — Other Ambulatory Visit: Payer: Self-pay

## 2019-05-28 ENCOUNTER — Ambulatory Visit: Payer: BC Managed Care – PPO | Admitting: Family Medicine

## 2019-05-28 DIAGNOSIS — E785 Hyperlipidemia, unspecified: Secondary | ICD-10-CM

## 2019-05-28 DIAGNOSIS — K824 Cholesterolosis of gallbladder: Secondary | ICD-10-CM

## 2019-05-28 DIAGNOSIS — I10 Essential (primary) hypertension: Secondary | ICD-10-CM

## 2019-05-28 DIAGNOSIS — R194 Change in bowel habit: Secondary | ICD-10-CM

## 2019-05-29 ENCOUNTER — Ambulatory Visit (HOSPITAL_COMMUNITY)
Admission: RE | Admit: 2019-05-29 | Discharge: 2019-05-29 | Disposition: A | Discharge status: Home / Self Care | Payer: BC Managed Care – PPO | Source: Ambulatory Visit | Attending: Nurse Practitioner | Admitting: Nurse Practitioner

## 2019-05-29 ENCOUNTER — Other Ambulatory Visit: Payer: Self-pay

## 2019-05-29 DIAGNOSIS — R194 Change in bowel habit: Secondary | ICD-10-CM | POA: Insufficient documentation

## 2019-05-29 DIAGNOSIS — K824 Cholesterolosis of gallbladder: Secondary | ICD-10-CM | POA: Diagnosis not present

## 2019-05-30 LAB — CELIAC PANEL 10
Antigliadin Abs, IgA: 7 units (ref 0–19)
Endomysial IgA: NEGATIVE
Gliadin IgG: 3 units (ref 0–19)
IgA/Immunoglobulin A, Serum: 129 mg/dL (ref 87–352)
Tissue Transglut Ab: 2 U/mL (ref 0–5)
Transglutaminase IgA: <2 U/mL (ref 0–3)

## 2019-05-30 LAB — BASIC METABOLIC PANEL WITH GFR
BUN/Creatinine Ratio: 18 (ref 9–23)
BUN: 18 mg/dL (ref 6–24)
CO2: 26 mmol/L (ref 20–29)
Calcium: 9.7 mg/dL (ref 8.7–10.2)
Chloride: 102 mmol/L (ref 96–106)
Creatinine, Ser: 1 mg/dL (ref 0.57–1.00)
GFR calc Af Amer: 74 mL/min/{1.73_m2}
GFR calc non Af Amer: 64 mL/min/{1.73_m2}
Glucose: 96 mg/dL (ref 65–99)
Potassium: 4.3 mmol/L (ref 3.5–5.2)
Sodium: 143 mmol/L (ref 134–144)

## 2019-05-30 LAB — LIPID PANEL
Chol/HDL Ratio: 4.8 ratio — ABNORMAL HIGH (ref 0.0–4.4)
Cholesterol, Total: 211 mg/dL — ABNORMAL HIGH (ref 100–199)
HDL: 44 mg/dL
LDL Chol Calc (NIH): 143 mg/dL — ABNORMAL HIGH (ref 0–99)
Triglycerides: 132 mg/dL (ref 0–149)
VLDL Cholesterol Cal: 24 mg/dL (ref 5–40)

## 2019-05-30 LAB — C-REACTIVE PROTEIN: CRP: 1 mg/L (ref 0–10)

## 2019-06-02 ENCOUNTER — Telehealth: Payer: Self-pay | Admitting: *Deleted

## 2019-06-02 ENCOUNTER — Telehealth: Payer: Self-pay | Admitting: Nurse Practitioner

## 2019-06-02 DIAGNOSIS — E785 Hyperlipidemia, unspecified: Secondary | ICD-10-CM

## 2019-06-02 MED ORDER — ROSUVASTATIN CALCIUM 5 MG PO TABS: 5.0000 mg | ORAL_TABLET | Freq: Every day | ORAL | 3 refills | Status: DC

## 2019-06-02 NOTE — Telephone Encounter (Signed)
Spoke with patient. She will add Crestor 5 mg once daily and plan to return for fasting lipids and ast on 07/29/19.

## 2019-06-02 NOTE — Telephone Encounter (Signed)
Please review and advise.

## 2019-06-02 NOTE — Telephone Encounter (Signed)
Patient can purchase the least expensive probiotic over the counter, get what is on sale and what is affordable. thx

## 2019-06-02 NOTE — Telephone Encounter (Signed)
Called and spoke with patient-informed patient of provider's information; Patient verbalized understanding of information/instructions;   Patient advised to call back to the office at [phone] should questions/concerns arise;

## 2019-06-02 NOTE — Telephone Encounter (Signed)
-----   Message from Fay Records, MD sent at 05/31/2019 10:53 PM EST ----- ELectrolytesa and kidney function are normal Lipids    LDL is 143   With minimal atherosclerosis I would recomm Crestor 5 to lower LDL   Goal:  below 100 for sure Recomm: Crestor 5 and follow up lpids and AST in 8 wks

## 2019-06-02 NOTE — Telephone Encounter (Signed)
Pt inquired about the differences between Florastor and [employer] probiotic.  She stated that she did not see the difference online, and that they are $15-20 for a 20-day supply.  Pt asked for a cheaper alternative.

## 2019-06-03 ENCOUNTER — Telehealth: Payer: Self-pay | Admitting: Gastroenterology

## 2019-06-03 NOTE — Telephone Encounter (Signed)
Called patient  She is frustrated about number of meds  REcom:  1.  Hold Zetia   Keep on crestor 5  Will f/u lipids and AST in 8 wks  2.  BP has been good   Try 12.5 spironolactone and follow BP     3  Continue toprol XL  Pt says that I am not on her drop down list as one of her physicians   She cannot communicate with my on MyChart.  I am not sure who to contact.

## 2019-06-03 NOTE — Telephone Encounter (Signed)
Patient would like the nurse to call her again and answer some questions about lab results and medications. The patient states she took the call during dinner with other people because she was worried about bad results. She is confused about the four different medications she is taking and whether she does in fact need to be on all 4. Please advise

## 2019-06-03 NOTE — Telephone Encounter (Signed)
Pt called looking for [place] results. Pls call her.

## 2019-06-03 NOTE — Telephone Encounter (Signed)
See abdominal sono result note

## 2019-06-03 NOTE — Telephone Encounter (Signed)
Patient recently saw [facility].

## 2019-06-03 NOTE — Telephone Encounter (Signed)
lpmtcb 12/16 regarding labs/medications

## 2019-06-03 NOTE — Telephone Encounter (Signed)
I spoke to the patient and reviewed her labs/medications.  She would like to know if there are alternatives to the 4 heart medications that she is prescribed, (Zetia, Crestor, Metoprolol Succinate and Spironolactone).  She said that they are getting costly and is trying to watch diet/exercise.  She would like further advisement from Dr Harrington Challenger.

## 2019-06-03 NOTE — Telephone Encounter (Signed)
Please review and advise (information has already been sent to Dr. Fuller Plan for review-no notation noted yet)

## 2019-06-04 MED ORDER — SPIRONOLACTONE 25 MG PO TABS: 12.5000 mg | ORAL_TABLET | Freq: Every day | ORAL | 3 refills | Status: DC

## 2019-06-04 NOTE — Telephone Encounter (Signed)
Discontinued zetia and changed spironolactone to 12.5 mg daily. Pt is scheduled for labs on 07/29/19. (Lipids/liver) Updated cardiologist in care teams from consulting to primary cardiologist to see if this makes Dr. Harrington Challenger accessible to patient in [place].

## 2019-06-04 NOTE — Telephone Encounter (Signed)
Please see additional documentation concerning this patient 

## 2019-06-04 NOTE — Addendum Note (Signed)
Addended by: Rodman Key on: 06/04/2019 02:17 PM   Modules accepted: Orders

## 2019-06-04 NOTE — Telephone Encounter (Signed)
Pt calling wants to speak to a nurse about her results.

## 2019-06-08 ENCOUNTER — Telehealth: Payer: Self-pay

## 2019-06-08 NOTE — Telephone Encounter (Signed)
Patient notified of the results and recommendations.  She would like to proceed with surgical consult.  She is advised that I will send the referral and she will be contacted directly by [facility] with an appt date and time.

## 2019-06-08 NOTE — Telephone Encounter (Signed)
Ladene Artist, MD sent to [name], RN; Fay Records, MD  [name], We will contact her to clarify, address questions. [name],  Please contact patient regarding her 7 mm GB polyp. With age > 53 and 7 mm size observation with repeat US in 3-6 months vs cholecystectomy are the options. Surgery is the preferred option as it will provide pathology of the polyp. If no surgery then RUQ [place] in 3 months and if polyp increases in size then surgery.  MS       Previous Messages   ----- Message -----  From: Fay Records, MD  Sent: 06/05/2019  8:44 PM EST  To: Ladene Artist, MD   Norberto Sorenson I saw Molly Peterson in clinic the other day  She called me back to tell me about the USN  She is confused about what to do  Nurse called and could not provide all answers.  Given option for surgery or f/u in 6 months  Has questions.   Molly Peterson    Left message for patient to call back

## 2019-06-16 ENCOUNTER — Other Ambulatory Visit: Payer: BC Managed Care – PPO

## 2019-06-17 ENCOUNTER — Ambulatory Visit: Payer: BC Managed Care – PPO | Attending: Internal Medicine

## 2019-06-17 DIAGNOSIS — Z20828 Contact with and (suspected) exposure to other viral communicable diseases: Secondary | ICD-10-CM | POA: Diagnosis not present

## 2019-06-17 DIAGNOSIS — Z20822 Contact with and (suspected) exposure to covid-19: Secondary | ICD-10-CM

## 2019-06-18 LAB — NOVEL CORONAVIRUS, NAA: SARS-CoV-2, NAA: NOT DETECTED

## 2019-06-25 NOTE — Telephone Encounter (Signed)
Patient has been scheduled to see Dr. Rosendo Gros on 06/26/19 2:30

## 2019-07-01 ENCOUNTER — Ambulatory Visit: Payer: BC Managed Care – PPO | Admitting: Gastroenterology

## 2019-07-03 ENCOUNTER — Other Ambulatory Visit: Payer: Self-pay

## 2019-07-03 ENCOUNTER — Ambulatory Visit
Admission: RE | Admit: 2019-07-03 | Discharge: 2019-07-03 | Disposition: A | Discharge status: Home / Self Care | Payer: BC Managed Care – PPO | Source: Ambulatory Visit | Attending: Obstetrics and Gynecology | Admitting: Obstetrics and Gynecology

## 2019-07-03 DIAGNOSIS — K824 Cholesterolosis of gallbladder: Secondary | ICD-10-CM | POA: Diagnosis not present

## 2019-07-03 DIAGNOSIS — Z1231 Encounter for screening mammogram for malignant neoplasm of breast: Secondary | ICD-10-CM

## 2019-07-29 ENCOUNTER — Other Ambulatory Visit: Payer: BC Managed Care – PPO

## 2019-07-30 ENCOUNTER — Telehealth: Payer: Self-pay

## 2019-07-30 NOTE — Telephone Encounter (Signed)
Patient contacted to set up follow up US to assess gallbladder polyp.  Patient states that she is having a cholecystectomy planned for March.  She will call back if she does not have planned surgery to set up [place].

## 2019-07-30 NOTE — Telephone Encounter (Signed)
-----   Message from [name], RN sent at 01/27/2019  1:39 PM EDT ----- Needs [place]- see results 01/27/19 stark

## 2019-07-30 NOTE — Telephone Encounter (Signed)
Patient called our office about a student she was in contact with on Tuesday 07/28/2019 that tested positive for Covid .  Patient was following social distance guidelines and wearing a mask  with limited exposure less than 15 min at a time.  She denies any symptoms other than sinus issues that were already present before the exposure. At this point patient is asymptomatic and will be tested Monday 08/03/2019 as an exposure and not considered a close contact.

## 2019-08-03 ENCOUNTER — Ambulatory Visit: Payer: BC Managed Care – PPO | Admitting: Medical

## 2019-08-03 ENCOUNTER — Other Ambulatory Visit: Payer: Self-pay

## 2019-08-03 DIAGNOSIS — Z1152 Encounter for screening for COVID-19: Secondary | ICD-10-CM

## 2019-08-03 LAB — POC COVID19 BINAXNOW: SARS Coronavirus 2 Ag: NEGATIVE

## 2019-08-03 NOTE — Progress Notes (Signed)
Pt here for rapid Covid antigen test due to possible exposure; no symptoms reported; test negative; pt verb u/o results and to continue to monitor herself and if she develops symptoms to be tested again; continue good hand washing, practicing social distancing and wearing a face mask covering.   POC Novel Corona Virus test result was Negative.

## 2019-08-04 ENCOUNTER — Encounter: Payer: Self-pay | Admitting: Medical

## 2019-08-12 NOTE — Telephone Encounter (Signed)
I spoke with the patient and her secondary insurance is not accepted by [facility].  She does not have the required deposit that they require upfront to schedule the surgery for her cholecystectomy.  She is asking about alternate options.  She is advised to contact her secondary insurance and see who is in network in our area and we will be happy to refer her to them.  She will call back once she has more information.

## 2019-08-12 NOTE — Telephone Encounter (Signed)
Pt stated that [facility] is not contracted with her insurance.  Pt would like to discuss alternative surgeons.

## 2019-09-03 DIAGNOSIS — Z01818 Encounter for other preprocedural examination: Secondary | ICD-10-CM | POA: Diagnosis not present

## 2019-09-07 ENCOUNTER — Other Ambulatory Visit: Payer: Self-pay | Admitting: General Surgery

## 2019-09-07 DIAGNOSIS — K829 Disease of gallbladder, unspecified: Secondary | ICD-10-CM | POA: Diagnosis not present

## 2019-09-07 DIAGNOSIS — K824 Cholesterolosis of gallbladder: Secondary | ICD-10-CM | POA: Diagnosis not present

## 2019-09-14 ENCOUNTER — Telehealth: Payer: Self-pay | Admitting: *Deleted

## 2019-09-14 NOTE — Telephone Encounter (Signed)
It should be safe to get the vaccine 1 week after surgery.   Though she may want to consider delaying if any of the following:  1) She had significant side effects to the first shot 2) She is still having a lot of post-surgical pain or symptoms  If she is recovering well from surgery and is Ok with the possibility of having side effects to the vaccine (fever, chills, flu-like symptoms) then it is OK.  She may also want to reach out to her surgeon to let them know she is getting the shot.

## 2019-09-14 NOTE — Telephone Encounter (Signed)
Patient left a voicemail stating that she had her gallbladder removed last Monday. Patient stated that she is due her second covid vaccine tomorrow. Patient stated that she was told to contact her doctor to see if it is too soon after surgery to have the vaccine? Patient requested a call back with recommendation.

## 2019-09-14 NOTE — Telephone Encounter (Signed)
Pt notified of all of Dr. Verda Cumins recommendations. Pt said that surgeon is who told her to f/u with [place] when she asked them. Pt said she is doing okay she still has some pain but that's to be expected. Pt is planning on getting vaccine tomorrow but she will check with the nurse there to see what they recommend before proceeding with vaccine. She is okay with having the side eff of vaccine "on top" of recovering from surgery

## 2019-09-17 ENCOUNTER — Telehealth: Payer: Self-pay | Admitting: Nurse Practitioner

## 2019-09-17 NOTE — Telephone Encounter (Signed)
Pt reported that she recently had gallbladder surgery and would like to discuss whether "the feelings she is having are normal."

## 2019-09-18 ENCOUNTER — Telehealth: Payer: Self-pay | Admitting: Internal Medicine

## 2019-09-18 NOTE — Telephone Encounter (Signed)
Molly Peterson is calling wanting to know what Dr. Harrington Challenger would like to do now that she has had her surgery due to the medication changes that were previously being discussed before her procedure. Please advise.

## 2019-09-18 NOTE — Telephone Encounter (Signed)
I spoke with patient. She had gall bladder surgery on 3/22. Has questions about possible medication changes that were discussed prior to surgery.  She does not have list of her current medications with her and is not sure all medications she is taking.  She will call back with list of her current medications.

## 2019-09-18 NOTE — Telephone Encounter (Signed)
Pt called back and reports she is taking the following cardiac medications- Spironolactone 25 mg daily, Toprol 50 mg daily and Zetia 10 mg daily. She reports BP has been around 122/76. Does have some lightheadedness. Per phone note from 06/02/19 patient was to decrease spironolactone to 12.5 mg daily.  I told her to make this change now and to monitor BP. Patient reports Dr Harrington Challenger told her not to stop Rosuvastatin until after surgery.  Her gall bladder surgery was originally scheduled for January but was rescheduled until March 22. Will send to Dr Harrington Challenger for instructions regarding cholesterol medications.

## 2019-09-21 NOTE — Telephone Encounter (Signed)
I left a message for the patient that she should reach out to her surgeon for this questions.

## 2019-09-25 NOTE — Telephone Encounter (Signed)
Spoke with pt    She has noticed some dizziness intermittently  No syncope She just cut back on mer meds a few days ago  Also, she has been noticing more palpitations at night when laying on R side   Not at other times     I recomm:  Continue to follow BP with change in meds   Write back in a couple weeks  Stay hydrated  Keep off of cholesterol med until surgery signs off.

## 2019-10-31 ENCOUNTER — Other Ambulatory Visit: Payer: Self-pay | Admitting: Internal Medicine

## 2019-11-02 ENCOUNTER — Telehealth: Payer: Self-pay | Admitting: Internal Medicine

## 2019-11-02 MED ORDER — EZETIMIBE 10 MG PO TABS: 10.0000 mg | ORAL_TABLET | Freq: Every day | ORAL | 1 refills | Status: DC

## 2019-11-02 NOTE — Telephone Encounter (Signed)
Outpatient Medication Detail   Disp Refills Start End   ezetimibe (ZETIA) 10 MG tablet 90 tablet 1 11/02/2019    Sig - Route: Take 1 tablet (10 mg total) by mouth daily. - Oral   Sent to pharmacy as: ezetimibe (ZETIA) 10 MG tablet   E-Prescribing Status: Receipt confirmed by pharmacy (11/02/2019  1:51 PM EDT)   [place], [place]

## 2019-11-02 NOTE — Telephone Encounter (Signed)
New Message    Pt c/o medication issue:  1. Name of Medication: Zetia   2. How are you currently taking this medication (dosage and times per day)? Pt is out of medication   3. Are you having a reaction (difficulty breathing--STAT)? No   4. What is your medication issue? Pt is calling and says she needs this medication refilled and is out of medication

## 2019-11-02 NOTE — Telephone Encounter (Signed)
Zetia refill sent to [facility].

## 2020-01-04 ENCOUNTER — Other Ambulatory Visit: Payer: Self-pay

## 2020-01-04 MED ORDER — METOPROLOL SUCCINATE ER 50 MG PO TB24: 50.0000 mg | ORAL_TABLET | Freq: Every day | ORAL | 0 refills | Status: DC

## 2020-01-12 DIAGNOSIS — L814 Other melanin hyperpigmentation: Secondary | ICD-10-CM | POA: Diagnosis not present

## 2020-01-12 DIAGNOSIS — Z808 Family history of malignant neoplasm of other organs or systems: Secondary | ICD-10-CM | POA: Diagnosis not present

## 2020-01-12 DIAGNOSIS — L578 Other skin changes due to chronic exposure to nonionizing radiation: Secondary | ICD-10-CM | POA: Diagnosis not present

## 2020-01-12 DIAGNOSIS — L821 Other seborrheic keratosis: Secondary | ICD-10-CM | POA: Diagnosis not present

## 2020-02-06 ENCOUNTER — Other Ambulatory Visit: Payer: Self-pay | Admitting: Internal Medicine

## 2020-02-25 NOTE — Progress Notes (Signed)
.    Cardiology Office Note   Date:  02/26/2020   ID:  [name], DOB 10/22/1965, MRN [id number]  PCP:  [name], MD  Cardiologist:   Dorris Carnes, MD   F/U of HTN     History of Present Illness: Molly Peterson is a 54 y.o. female with a history of HTN, palpitations, chest pressure  Echo in 2018 LVEF 60 to 65% with Gr I diastolic dysfunction.   The pt had a cronary CT angionram    This showed no coronary calcdifcations;but  minimal aortic plaquing   The pt had problems with lisinopril  Had swelling of hands and feet.  Plaed on aldactone  Was on HCTZ  HCTZ was stopped   Toprol added   \ I saw the pt in Nov 2020  Since seen the pt has done OK   She is just about to enter an organized exercise program at [employer] where she works  She deneis Cp   Breathing is OK  No dizziness  Current Meds  Medication Sig  . ALPRAZolam (XANAX) 0.25 MG tablet Take 1 tablet (0.25 mg total) by mouth as needed for anxiety.  Marland Kitchen ezetimibe (ZETIA) 10 MG tablet Take 1 tablet (10 mg total) by mouth daily. Please keep upcoming appt in September with Dr. Harrington Challenger before anymore refills. Thank you  . Loratadine 10 MG CAPS Take by mouth daily.   . metoprolol succinate (TOPROL-XL) 50 MG 24 hr tablet Take 1 tablet (50 mg total) by mouth daily. Take with or immediately following a meal. Please keep upcoming appt with Dr Harrington Challenger for future refills. Thank you  . PREMARIN vaginal cream Place 0.625 mg vaginally as directed.  . risedronate (ACTONEL) 150 MG tablet Take 150 mg by mouth every 30 (thirty) days.   Marland Kitchen spironolactone (ALDACTONE) 25 MG tablet Take 0.5 tablets (12.5 mg total) by mouth daily.   Current Facility-Administered Medications for the 02/26/20 encounter (Office Visit) with Fay Records, MD  Medication  . 0.9 %  sodium chloride infusion     Allergies:   Latex and Sulfa antibiotics   Past Medical History:  Diagnosis Date  . Acute pharyngitis   . Allergy   . ANXIETY 09/18/2006  . DEPRESSION  09/18/2006  . DERMATITIS, SEBORRHEIC NEC 01/06/2007  . Edema    symptom  . Flatulence, eructation, and gas pain   . Hyperlipidemia   . HYPERTENSION 09/18/2006  . Palpitations 09/18/2006  . REACTION, ACUTE STRESS W/EMOTIONAL DSTURB 12/23/2006  . Unspecified constipation   . Urinary tract infection, site not specified     Past Surgical History:  Procedure Laterality Date  . ABDOMINAL HYSTERECTOMY     partial-still has ovaries  . APPENDECTOMY  unknown  . Caesarean     x2  . CESAREAN SECTION    . TONSILLECTOMY  unknown     Social History:  The patient  reports that she quit smoking about 15 years ago. Her smoking use included cigarettes. She has a 4.00 pack-year smoking history. She has never used smokeless tobacco. She reports current alcohol use. She reports that she does not use drugs.   Family History:  The patient's family history includes Asthma in her father; Atrial fibrillation (age of onset: 41) in her mother; Dementia in her maternal grandmother; Depression in her son; Hearing loss in her father; Heart attack in her maternal grandfather and paternal grandfather; Heart attack (age of onset: 51) in her father; Heart disease in her father and  mother; Heart failure in her maternal grandfather; Hyperlipidemia in her father; Hypertension in her father; Kidney cancer in her father; Stroke (age of onset: 26) in her paternal grandmother.    ROS:  Please see the history of present illness. All other systems are reviewed and  Negative to the above problem except as noted.    PHYSICAL EXAM: VS:  BP 112/82   Pulse 73   Ht 5\' 3"  (1.6 m)   Wt 140 lb 6.4 oz (63.7 kg)   SpO2 99%   BMI 24.87 kg/m   GEN: Well nourished, well developed, in no acute distress  HEENT: normal  Neck: no JVD; no carotid bruit Cardiac: RRR; no murmurs  ,no LE edema  Respiratory:  clear to auscultation bilaterally, GI: soft, nontender, nondistended, + BS  No hepatomegaly  MS: no deformity Moving all extremities    Skin: warm and dry, no rash Neuro:  Strength and sensation are intact Psych: euthymic mood, full affect   EKG:  EKG is not ordered     Lipid Panel    Component Value Date/Time   CHOL 211 (H) 05/28/2019 0817   TRIG 132 05/28/2019 0817   HDL 44 05/28/2019 0817   CHOLHDL 4.8 (H) 05/28/2019 0817   CHOLHDL 4.8 06/01/2016 1534   VLDL 32 (H) 06/01/2016 1534   LDLCALC 143 (H) 05/28/2019 0817   LDLDIRECT 140.0 07/21/2014 0839      Wt Readings from Last 3 Encounters:  02/26/20 140 lb 6.4 oz (63.7 kg)  05/25/19 140 lb (63.5 kg)  05/01/19 140 lb (63.5 kg)      ASSESSMENT AND PLAN:  1  HTN   BP is good on current regimen  Continue  WIll check BMET   2  Dyspnea.  Pt denies   3  PAD   Reviewed CT with pt   Her Calcium score was 0 but she has minimal plaquing in aorta    Follow lipids     4  Lipids   Would get fasting  She is currently on Zetia  Discussed diet ( 2 meals; less snacking )        F/U in clinic in 1 year   Current medicines are reviewed at length with the patient today.  The patient does not have concerns regarding medicines.  Signed, Dorris Carnes, MD  02/26/2020 9:13 AM    [address], [place], [place]  [address] Phone: [phone]; Fax: [phone]

## 2020-02-26 ENCOUNTER — Encounter: Payer: Self-pay | Admitting: Internal Medicine

## 2020-02-26 ENCOUNTER — Other Ambulatory Visit: Payer: Self-pay

## 2020-02-26 ENCOUNTER — Ambulatory Visit (INDEPENDENT_AMBULATORY_CARE_PROVIDER_SITE_OTHER): Payer: BC Managed Care – PPO | Admitting: Internal Medicine

## 2020-02-26 VITALS — BP 112/82 | HR 73 | Ht 63.0 in | Wt 140.4 lb

## 2020-02-26 DIAGNOSIS — I10 Essential (primary) hypertension: Secondary | ICD-10-CM

## 2020-02-26 LAB — BASIC METABOLIC PANEL
BUN/Creatinine Ratio: 19 (ref 9–23)
BUN: 16 mg/dL (ref 6–24)
CO2: 27 mmol/L (ref 20–29)
Calcium: 9.5 mg/dL (ref 8.7–10.2)
Chloride: 102 mmol/L (ref 96–106)
Creatinine, Ser: 0.86 mg/dL (ref 0.57–1.00)
GFR calc Af Amer: 89 mL/min/{1.73_m2} (ref >59)
GFR calc non Af Amer: 77 mL/min/{1.73_m2} (ref >59)
Glucose: 78 mg/dL (ref 65–99)
Potassium: 4.4 mmol/L (ref 3.5–5.2)
Sodium: 141 mmol/L (ref 134–144)

## 2020-02-26 NOTE — Patient Instructions (Signed)
Medication Instructions:  No changes *If you need a refill on your cardiac medications before your next appointment, please call your pharmacy*   Lab Work: bmet today  If you have labs (blood work) drawn today and your tests are completely normal, you will receive your results only by: Marland Kitchen MyChart Message (if you have MyChart) OR . A paper copy in the mail If you have any lab test that is abnormal or we need to change your treatment, we will call you to review the results.   Testing/Procedures: none   Follow-Up: At [facility], you and your health needs are our priority.  As part of our continuing mission to provide you with exceptional heart care, we have created designated Provider Care Teams.  These Care Teams include your primary Cardiologist (physician) and Advanced Practice Providers (APPs -  Physician Assistants and Nurse Practitioners) who all work together to provide you with the care you need, when you need it.   Your next appointment:   11 month(s)  The format for your next appointment:   In Person  Provider:   You may see Dorris Carnes, MD or one of the following Advanced Practice Providers on your designated Care Team:    Richardson Dopp, PA-C  Robbie Lis, [place]    Other Instructions

## 2020-03-30 DIAGNOSIS — N952 Postmenopausal atrophic vaginitis: Secondary | ICD-10-CM | POA: Diagnosis not present

## 2020-03-30 DIAGNOSIS — R350 Frequency of micturition: Secondary | ICD-10-CM | POA: Diagnosis not present

## 2020-04-09 ENCOUNTER — Other Ambulatory Visit: Payer: Self-pay | Admitting: Internal Medicine

## 2020-04-12 ENCOUNTER — Telehealth: Payer: Self-pay | Admitting: Internal Medicine

## 2020-04-12 NOTE — Telephone Encounter (Signed)
Patient reports she picked up her most recent refill of spironolactone and mistakenly took a whole tablet for the last 45 days..   She has been on this dose in the past.  Her BP is not low at all.  She has some more frequent fluttering in her chest.  I adv to go back to 1/2 tablet of spironolactone daily and monitor frequency of fluttering.  Adv that Dr. Harrington Challenger may recommend increasing Toprol XL if this continues.  Spoke with pharmacy, they can refill the medication and get it ready for patient.

## 2020-04-12 NOTE — Telephone Encounter (Signed)
Pt c/o medication issue:  1. Name of Medication: spironolactone (ALDACTONE) 25 MG tablet  2. How are you currently taking this medication (dosage and times per day)?  One full tablet per day  3. Are you having a reaction (difficulty breathing--STAT)?  Feels as if her heart is fluttering - happens before bed most of the time 4. What is your medication issue?  Patient has been taking one full tablet of 25 MG per day under the impression that the tablets were only 12.5 MG for the last 45 days. She is supposed to take .5 of a tablet per day (12.5 MG). She would like to know if this will affect her or if there is anything she needs to do. She also needs it refilled early due to this error.   Please call/advise  *STAT* If patient is at the pharmacy, call can be transferred to refill team.   1. Which medications need to be refilled? (please list name of each medication and dose if known)   spironolactone (ALDACTONE) 25 MG tablet  2. Which pharmacy/location (including street and city if local pharmacy) is medication to be sent to? [place], [place]  3. Do they need a 30 day or 90 day supply?   90 day

## 2020-05-30 ENCOUNTER — Telehealth: Payer: Self-pay | Admitting: Nurse Practitioner

## 2020-05-30 NOTE — Telephone Encounter (Signed)
Patient called states she is getting an unusual sharp pain on her left side of her abdomin and is seeking advise

## 2020-05-31 NOTE — Telephone Encounter (Signed)
Called patient back. No answer. Left a voicemail of my returned call.

## 2020-06-02 NOTE — Telephone Encounter (Signed)
Pt is requesting a call back from a nurse (missed call) 

## 2020-06-06 NOTE — Telephone Encounter (Signed)
Pt is requesting another call back (missed call)

## 2020-06-06 NOTE — Telephone Encounter (Signed)
Spoke with the patient. She has noticed a "twinge of a pain" that was first on the left side at her waist (umbillicus) that would "get my attention but not double me over or anything." The pain is now over to the right side of her waist. She feels very bloated, her clothes do not fit right, and she feels full. She has daily bowel movements up to 3 times a day. Consistency is "soft serve ice cream." No nausea or cramping. Afebrile. She is beginning to feel back in her lower back now. She takes Hardin Negus colon health probiotics. No diet changes. No belching or burping. Not unusually gassy. She is concerned. She is uncomfortable. Please advise.

## 2020-06-06 NOTE — Telephone Encounter (Signed)
Left a message to call back.

## 2020-06-06 NOTE — Telephone Encounter (Signed)
Molly Peterson, pls call the patient, it has been one year since she was last seen in our office. If her abd pain persists pls send her to the lab for a CBC, CMP and CRP. Schedule office appointment. Change probiotics. Try IBGARD 1 po bid if affordable. If needed, she can take Dicyclomine 10mg  one tab po Q 8 hrs PRN, send RX # 20, no refills. Patient to call our office if sx worsen, to the ED if she has severe abd pain. Thx

## 2020-06-07 ENCOUNTER — Other Ambulatory Visit: Payer: Self-pay

## 2020-06-07 DIAGNOSIS — R109 Unspecified abdominal pain: Secondary | ICD-10-CM

## 2020-06-07 MED ORDER — DICYCLOMINE HCL 10 MG PO CAPS: 10.0000 mg | ORAL_CAPSULE | Freq: Three times a day (TID) | ORAL | 0 refills | Status: DC | PRN

## 2020-06-07 NOTE — Telephone Encounter (Signed)
Spoke with the patient. She agrees to this plan. Samples of probiotic and IBgard provided. Rx to her pharmacy. Follow up scheduled.

## 2020-06-07 NOTE — Telephone Encounter (Signed)
Patient is returning your call.  

## 2020-06-08 ENCOUNTER — Other Ambulatory Visit: Payer: PRIVATE HEALTH INSURANCE

## 2020-06-08 DIAGNOSIS — R109 Unspecified abdominal pain: Secondary | ICD-10-CM | POA: Diagnosis not present

## 2020-06-08 NOTE — Addendum Note (Signed)
Addended by: Adler Chartrand F on: 06/08/2020 01:40 PM ° ° Modules accepted: Orders ° °

## 2020-06-08 NOTE — Addendum Note (Signed)
Addended by: Iman Orourke F on: 06/08/2020 01:40 PM ° ° Modules accepted: Orders ° °

## 2020-06-08 NOTE — Addendum Note (Signed)
Addended by: Chaylee Ehrsam F on: 06/08/2020 01:40 PM ° ° Modules accepted: Orders ° °

## 2020-06-08 NOTE — Addendum Note (Signed)
Addended by: Adah Salvage F on: 06/08/2020 01:40 PM   Modules accepted: Orders

## 2020-06-09 ENCOUNTER — Telehealth: Payer: Self-pay | Admitting: Gastroenterology

## 2020-06-09 LAB — COMPREHENSIVE METABOLIC PANEL
ALT: 12 IU/L (ref 0–32)
AST: 14 IU/L (ref 0–40)
Albumin/Globulin Ratio: 2 (ref 1.2–2.2)
Albumin: 4.5 g/dL (ref 3.8–4.9)
Alkaline Phosphatase: 59 IU/L (ref 44–121)
BUN/Creatinine Ratio: 20 (ref 9–23)
BUN: 15 mg/dL (ref 6–24)
Bilirubin Total: 0.4 mg/dL (ref 0.0–1.2)
CO2: 27 mmol/L (ref 20–29)
Calcium: 9.8 mg/dL (ref 8.7–10.2)
Chloride: 102 mmol/L (ref 96–106)
Creatinine, Ser: 0.74 mg/dL (ref 0.57–1.00)
GFR calc Af Amer: 106 mL/min/{1.73_m2} (ref >59)
GFR calc non Af Amer: 92 mL/min/{1.73_m2} (ref >59)
Globulin, Total: 2.2 g/dL (ref 1.5–4.5)
Glucose: 64 mg/dL — ABNORMAL LOW (ref 65–99)
Potassium: 4.3 mmol/L (ref 3.5–5.2)
Sodium: 142 mmol/L (ref 134–144)
Total Protein: 6.7 g/dL (ref 6.0–8.5)

## 2020-06-09 LAB — HIGH SENSITIVITY CRP: CRP, High Sensitivity: 1.39 mg/L (ref 0.00–3.00)

## 2020-06-09 LAB — CBC WITH DIFFERENTIAL/PLATELET
Basophils Absolute: 0 10*3/uL (ref 0.0–0.2)
Basos: 0 %
EOS (ABSOLUTE): 0.3 10*3/uL (ref 0.0–0.4)
Eos: 5 %
Hematocrit: 41 % (ref 34.0–46.6)
Hemoglobin: 13.9 g/dL (ref 11.1–15.9)
Immature Grans (Abs): 0 10*3/uL (ref 0.0–0.1)
Immature Granulocytes: 0 %
Lymphocytes Absolute: 2.7 10*3/uL (ref 0.7–3.1)
Lymphs: 38 %
MCH: 29.7 pg (ref 26.6–33.0)
MCHC: 33.9 g/dL (ref 31.5–35.7)
MCV: 88 fL (ref 79–97)
Monocytes Absolute: 0.4 10*3/uL (ref 0.1–0.9)
Monocytes: 6 %
Neutrophils Absolute: 3.6 10*3/uL (ref 1.4–7.0)
Neutrophils: 51 %
Platelets: 236 10*3/uL (ref 150–450)
RBC: 4.68 x10E6/uL (ref 3.77–5.28)
RDW: 12.4 % (ref 11.7–15.4)
WBC: 7 10*3/uL (ref 3.4–10.8)

## 2020-06-09 NOTE — Telephone Encounter (Signed)
Pt is requesting a call back from a nurse to discuss the samples she picked up yesterday, pt states no instructions were attached.

## 2020-06-09 NOTE — Telephone Encounter (Signed)
Spoke with the patient. Reveiwed the instructions on IBgard as twice daily 90 to 30 minutes before a meal. For the probiotic, check the box to see if it is with a meal or not. She has not picked up the Dicyclomine yet. She will try it if these OTC meds do not help.

## 2020-06-24 ENCOUNTER — Encounter: Payer: Self-pay | Admitting: Gastroenterology

## 2020-06-24 ENCOUNTER — Ambulatory Visit (INDEPENDENT_AMBULATORY_CARE_PROVIDER_SITE_OTHER): Payer: BC Managed Care – PPO | Admitting: Gastroenterology

## 2020-06-24 VITALS — BP 120/80 | HR 77 | Ht 62.75 in | Wt 142.8 lb

## 2020-06-24 DIAGNOSIS — R1010 Upper abdominal pain, unspecified: Secondary | ICD-10-CM | POA: Diagnosis not present

## 2020-06-24 DIAGNOSIS — R140 Abdominal distension (gaseous): Secondary | ICD-10-CM

## 2020-06-24 NOTE — Patient Instructions (Signed)
Please stop taking the IBgard and pick up the prescription at your pharmacy of dicyclomine.   You can continue the probiotic daily.   You have been scheduled for a CT scan of the abdomen and pelvis at [facility] ([address]---this is in the same building as [employer]).   You are scheduled on 06/30/20 at 1:30pm. You should arrive 15 minutes prior to your appointment time for registration. Please follow the written instructions below on the day of your exam:  WARNING: IF YOU ARE ALLERGIC TO IODINE/X-RAY DYE, PLEASE NOTIFY RADIOLOGY IMMEDIATELY AT [phone]! YOU WILL BE GIVEN A 13 HOUR PREMEDICATION PREP.  1) Do not eat or drink anything after 9:30am (4 hours prior to your test) 2) You have been given 2 bottles of oral contrast to drink. The solution may taste better if refrigerated, but do NOT add ice or any other liquid to this solution. Shake well before drinking.    Drink 1 bottle of contrast @ 11:30am (2 hours prior to your exam)  Drink 1 bottle of contrast @ 12:30pm (1 hour prior to your exam)  You may take any medications as prescribed with a small amount of water, if necessary. If you take any of the following medications: METFORMIN, GLUCOPHAGE, GLUCOVANCE, AVANDAMET, RIOMET, FORTAMET, [place] MET, JANUMET, GLUMETZA or METAGLIP, you MAY be asked to HOLD this medication 48 hours AFTER the exam.  The purpose of you drinking the oral contrast is to aid in the visualization of your intestinal tract. The contrast solution may cause some diarrhea. Depending on your individual set of symptoms, you may also receive an intravenous injection of x-ray contrast/dye. Plan on being at [facility] for 30 minutes or longer, depending on the type of exam you are having performed.  This test typically takes 30-45 minutes to complete.  If you have any questions regarding your exam or if you need to reschedule, you may call the CT department at [phone] between  the hours of 8:00 am and 5:00 pm, Monday-Friday.  ___________________________________________________________ Due to recent changes in healthcare laws, you may see the results of your imaging and laboratory studies on MyChart before your provider has had a chance to review them.  We understand that in some cases there may be results that are confusing or concerning to you. Not all laboratory results come back in the same time frame and the provider may be waiting for multiple results in order to interpret others.  Please give [place] 48 hours in order for your provider to thoroughly review all the results before contacting the office for clarification of your results.    Thank you for choosing me and [facility].  Pricilla Riffle. Dagoberto Ligas., MD., Marval Regal

## 2020-06-24 NOTE — Progress Notes (Signed)
76   History of Present Illness: This is a 55 year old female who relates upper abdominal pain, abdominal bloating, nausea and frequent mild abdominal cramping.  She states these symptoms began after her cholecystectomy in March 2021.  We provided samples of IBgard and a probiotic, Envive, on December 23 and she notes improvement in the abdominal cramping and nausea however her upper abdominal pain and bloating are unchanged.  She has a history of long-term problems with constipation which resolved after her cholecystectomy.  She generally has a bowel movement 1-3 times per day which is described as soft.  Her GI symptoms do not appear to correlate with her bowel movements.  CT AP in 01/2019 and abd [place] in 05/2019 showed 7 mm and 2 mm gallbladder polyps, at least partial pancreas divisum, mild sigmoid colon diverticulosis, left kidney stone. She underwent cholecystectomy in March 2021 and the gallbladder pathology report was normal.  Her last office visit with [place] was in 05/2019. Colonoscopy in 04/2016 showed one SSP/SSA polyp, internal and external hemorrhoids. CBC, CMP, CRP December 22 were normal except for a low glucose at 64. Denies weight loss, constipation, diarrhea, change in stool caliber, melena, hematochezia,  vomiting, dysphagia, reflux symptoms, chest pain.  Current Medications, Allergies, Past Medical History, Past Surgical History, Family History and Social History were reviewed in [employer] record.   Physical Exam: General: Well developed, well nourished, no acute distress Head: Normocephalic and atraumatic Eyes:  sclerae anicteric, EOMI Ears: Normal auditory acuity Mouth: Not examined, mask on during Covid-19 pandemic Lungs: Clear throughout to auscultation Heart: Regular rate and rhythm; no murmurs, rubs or bruits Abdomen: Soft, mild upper abdominal tender and non distended. No masses, hepatosplenomegaly or hernias noted. Normal Bowel sounds Rectal: Not  done Musculoskeletal: Symmetrical with no gross deformities  Pulses:  Normal pulses noted Extremities: No clubbing, cyanosis, edema or deformities noted Neurological: Alert oriented x 4, grossly nonfocal Psychological:  Alert and cooperative. Normal mood and affect   Assessment and Recommendations:  1. Upper abdominal pain, abdominal bloating, nausea. R/O unlikely post operative fluid collection, unlikely neoplasm, etc.  Schedule CTAP.  Continue Envive 1 po qd. Trial of dicyclomine 10 mg po tid for 1-2 weeks and hold IBgard.  Remain on either dicyclomine or IBgard whichever provides the most symptomatic benefit.  If symptoms persist and her CT is unremarkable recommend proceeding with EGD for further evaluation.  2. S/P cholecystectomy, March 2021.   3. Personal history of SSP/SSA.  A 5-year interval surveillance colonoscopy is due in November 2022.  If EGD is required in the near future we can schedule surveillance colonoscopy at the same time.

## 2020-06-27 ENCOUNTER — Telehealth: Payer: Self-pay | Admitting: Gastroenterology

## 2020-06-27 NOTE — Telephone Encounter (Signed)
Tara CT Scanner is requesting orders for BUN Creatinine and GFR in order to be able to scan the pt scheduled for 1/13  CB [phone]

## 2020-06-27 NOTE — Telephone Encounter (Signed)
Molly Peterson, patient had a CMP on 06/08/20.  See under labs, was ordered by Carl Best.  Let me know if these won't work.

## 2020-06-29 ENCOUNTER — Other Ambulatory Visit: Payer: Self-pay

## 2020-06-29 ENCOUNTER — Encounter: Payer: Self-pay | Admitting: Family Medicine

## 2020-06-29 ENCOUNTER — Ambulatory Visit (INDEPENDENT_AMBULATORY_CARE_PROVIDER_SITE_OTHER): Payer: BC Managed Care – PPO | Admitting: Family Medicine

## 2020-06-29 VITALS — BP 108/78 | HR 67 | Temp 97.7°F | Wt 142.8 lb

## 2020-06-29 DIAGNOSIS — M545 Low back pain, unspecified: Secondary | ICD-10-CM

## 2020-06-29 DIAGNOSIS — G8929 Other chronic pain: Secondary | ICD-10-CM | POA: Diagnosis not present

## 2020-06-29 DIAGNOSIS — E162 Hypoglycemia, unspecified: Secondary | ICD-10-CM | POA: Diagnosis not present

## 2020-06-29 DIAGNOSIS — R7309 Other abnormal glucose: Secondary | ICD-10-CM

## 2020-06-29 LAB — POCT GLYCOSYLATED HEMOGLOBIN (HGB A1C): Hemoglobin A1C: 5.2 % (ref 4.0–5.6)

## 2020-06-29 NOTE — Progress Notes (Signed)
Subjective:     Molly Peterson is a 55 y.o. female presenting for Discuss blood work  (Glucose low)     HPI   #Stomach issues - had GB removed and stomach is still up set - working with GI to look into why her stomach is not doing well - has regular BM TID  - taking probiotic and is regular  #low glucose - cousin said that having her GB removed triggered diabetes - ate toast with honey about 2 hours before the test  #Back pain - when this flares up she will get constipated - previously would flare up every few months - now getting symptoms once a week - resolves with motrin and "back aid" - has chronic back pain all the time - no radiating symptoms down the legs - does have right knee pain but otherwise no leg pain - worse with climbing straight up - back pain present since kids - no numbness in the feet - hands "go to sleep" with tingling numbness - has some vaginal dryness but otherwise no vaginal symptoms or pain with intercourse or urinary incontinence  - started after pregnancy - had C-sections - hysterectomy in 2006 and had severe pain following - did PT for months w/o improvement  - PT 2006 and 2015 w/o improvement - did have back imaging >15 years and was told possible arthritis    Review of Systems   Social History   Tobacco Use  Smoking Status Former Smoker  . Packs/day: 0.25  . Years: 16.00  . Pack years: 4.00  . Types: Cigarettes  . Quit date: 06/18/2004  . Years since quitting: 16.0  Smokeless Tobacco Never Used        Objective:    BP Readings from Last 3 Encounters:  06/29/20 108/78  06/24/20 120/80  02/26/20 112/82   Wt Readings from Last 3 Encounters:  06/29/20 142 lb 12 oz (64.8 kg)  06/24/20 142 lb 12.8 oz (64.8 kg)  02/26/20 140 lb 6.4 oz (63.7 kg)    BP 108/78   Pulse 67   Temp 97.7 F (36.5 C) (Temporal)   Wt 142 lb 12 oz (64.8 kg)   SpO2 98%   BMI 25.49 kg/m    Physical Exam Constitutional:      General: She  is not in acute distress.    Appearance: She is well-developed. She is not diaphoretic.  HENT:     Right Ear: External ear normal.     Left Ear: External ear normal.     Nose: Nose normal.  Eyes:     Conjunctiva/sclera: Conjunctivae normal.  Cardiovascular:     Rate and Rhythm: Normal rate and regular rhythm.     Heart sounds: No murmur heard.   Pulmonary:     Effort: Pulmonary effort is normal. No respiratory distress.     Breath sounds: Normal breath sounds. No wheezing.  Musculoskeletal:     Cervical back: Neck supple.     Comments: Back Inspection: no abnormalities Palpation: no ttp ROM: normal flexion, extension, rotation, pain with right lateral flexion on the left side Strength:norma Reflexes: normal Negative straight leg raise  Skin:    General: Skin is warm and dry.     Capillary Refill: Capillary refill takes less than 2 seconds.  Neurological:     Mental Status: She is alert. Mental status is at baseline.  Psychiatric:        Mood and Affect: Mood normal.  Behavior: Behavior normal.           Assessment & Plan:   Problem List Items Addressed This Visit      Other   Chronic back pain - Primary    Continues to do some home PT from prior trials. Given 15-20 years of history and previous PT attempts will refer to Ortho for further evaluation. Suspect MSK related and no signs of nerve involvement on exam today.       Relevant Orders   Ambulatory referral to Orthopedic Surgery    Other Visit Diagnoses    Low glucose level       Relevant Orders   POCT glycosylated hemoglobin (Hb A1C) (Completed)     Discussed one low glucose level is not worrisome. HbA1c is within normal range. Advised that she work to eat snacks throughout the day and if she begins to have shaking/tremors/dizziness or other symptoms that could signal low BS to eat and notify me if recurring.    Return if symptoms worsen or fail to improve.  [name], MD  This visit  occurred during the SARS-CoV-2 public health emergency.  Safety protocols were in place, including screening questions prior to the visit, additional usage of staff PPE, and extensive cleaning of exam room while observing appropriate contact time as indicated for disinfecting solutions.

## 2020-06-29 NOTE — Patient Instructions (Signed)
#  Referral I have placed a referral to a specialist for you. You should receive a phone call from the specialty office. Make sure your voicemail is not full and that if you are able to answer your phone to unknown or new numbers.   It may take up to 2 weeks to hear about the referral. If you do not hear anything in 2 weeks, please call our office and ask to speak with the referral coordinator.    Start doing PT exercises daily  Hemoglobin A1c today  Consider more frequent snacks throughout the day

## 2020-06-29 NOTE — Assessment & Plan Note (Addendum)
Continues to do some home PT from prior trials. Given 15-20 years of history and previous PT attempts will refer to Ortho for further evaluation. Suspect MSK related and no signs of nerve involvement on exam today.

## 2020-06-30 ENCOUNTER — Ambulatory Visit (INDEPENDENT_AMBULATORY_CARE_PROVIDER_SITE_OTHER)
Admission: RE | Admit: 2020-06-30 | Discharge: 2020-06-30 | Disposition: A | Discharge status: Home / Self Care | Payer: BC Managed Care – PPO | Source: Ambulatory Visit | Attending: Gastroenterology | Admitting: Gastroenterology

## 2020-06-30 DIAGNOSIS — R109 Unspecified abdominal pain: Secondary | ICD-10-CM | POA: Diagnosis not present

## 2020-06-30 DIAGNOSIS — R140 Abdominal distension (gaseous): Secondary | ICD-10-CM

## 2020-06-30 DIAGNOSIS — R1010 Upper abdominal pain, unspecified: Secondary | ICD-10-CM | POA: Diagnosis not present

## 2020-06-30 MED ORDER — IOHEXOL 300 MG/ML  SOLN
100.0000 mL | Freq: Once | INTRAMUSCULAR | Status: AC | PRN
  Administered 2020-06-30: 100 mL via INTRAVENOUS

## 2020-07-26 DIAGNOSIS — M25561 Pain in right knee: Secondary | ICD-10-CM | POA: Diagnosis not present

## 2020-07-26 DIAGNOSIS — M545 Low back pain, unspecified: Secondary | ICD-10-CM | POA: Diagnosis not present

## 2020-08-03 ENCOUNTER — Telehealth: Payer: Self-pay | Admitting: Gastroenterology

## 2020-08-03 DIAGNOSIS — M545 Low back pain, unspecified: Secondary | ICD-10-CM | POA: Diagnosis not present

## 2020-08-03 NOTE — Telephone Encounter (Signed)
Patient does not like the Envive.  She is no longer having abdominal bloating, but she is having c onstipation with the envivie.  She is asking about an alternative probiotic.  She will try Align and call back if she fails to improve

## 2020-08-03 NOTE — Telephone Encounter (Signed)
Pt states that probiotics that she was told to take is not helping. Pt wants to know if she can go back taking the medication that she was taking before even though it makes her stomach hurt.

## 2020-08-04 ENCOUNTER — Other Ambulatory Visit: Payer: Self-pay | Admitting: Internal Medicine

## 2020-08-05 DIAGNOSIS — M5416 Radiculopathy, lumbar region: Secondary | ICD-10-CM | POA: Diagnosis not present

## 2020-08-13 DIAGNOSIS — J018 Other acute sinusitis: Secondary | ICD-10-CM | POA: Diagnosis not present

## 2020-08-16 DIAGNOSIS — Z01419 Encounter for gynecological examination (general) (routine) without abnormal findings: Secondary | ICD-10-CM | POA: Diagnosis not present

## 2020-08-16 DIAGNOSIS — Z6825 Body mass index (BMI) 25.0-25.9, adult: Secondary | ICD-10-CM | POA: Diagnosis not present

## 2020-08-26 ENCOUNTER — Telehealth: Payer: Self-pay | Admitting: Internal Medicine

## 2020-08-26 NOTE — Telephone Encounter (Signed)
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    BP 162/90    Then 140s  Work very stressful   Worked 72 hours      Pt took  1/2 aldactone   I told her to take 1/2 metoprolol  Pt going home   Told to call back with response.  Admits to being anxious, very anxious about BP climbing  Husband out of town.  Dorris Carnes

## 2020-08-30 ENCOUNTER — Other Ambulatory Visit: Payer: Self-pay | Admitting: Obstetrics and Gynecology

## 2020-08-30 DIAGNOSIS — L821 Other seborrheic keratosis: Secondary | ICD-10-CM | POA: Diagnosis not present

## 2020-08-30 DIAGNOSIS — Z1231 Encounter for screening mammogram for malignant neoplasm of breast: Secondary | ICD-10-CM

## 2020-08-31 ENCOUNTER — Telehealth: Payer: Self-pay | Admitting: Internal Medicine

## 2020-08-31 ENCOUNTER — Inpatient Hospital Stay: Admission: RE | Admit: 2020-08-31 | Payer: BC Managed Care – PPO | Source: Ambulatory Visit

## 2020-08-31 NOTE — Telephone Encounter (Signed)
Reviewed with Dr. Harrington Challenger. Called patient and added to schedule Monday morning.  The chest tightness/heaviness is an unusual feeling and BP is up when she checks it.   130/88    Noticeable, but not breathtaking. She is worried because of her family history.

## 2020-08-31 NOTE — Telephone Encounter (Signed)
Pt c/o of Chest Pain: STAT if CP now or developed within 24 hours  1. Are you having CP right now? No   2. Are you experiencing any other symptoms (ex. SOB, nausea, vomiting, sweating)? Hypertension & Dizziness Friday 08/26/20  3. How long have you been experiencing CP? Since last Thursday off and on   4. Is your CP continuous or coming and going? Coming and going   5. Have you taken Nitroglycerin? No   Breona is calling stating she has been having chest tightness today. She states this has been going on since last Thursday and is something she has discussed with Dr. Harrington Challenger on 08/26/20 when speaking with her about her hypertension. She is requesting an appointment in regards to this with Dr. Harrington Challenger, but is willing to see a PA if need be. Please advise. ?

## 2020-09-05 ENCOUNTER — Encounter: Payer: Self-pay | Admitting: Internal Medicine

## 2020-09-05 ENCOUNTER — Ambulatory Visit (INDEPENDENT_AMBULATORY_CARE_PROVIDER_SITE_OTHER): Payer: BC Managed Care – PPO | Admitting: Internal Medicine

## 2020-09-05 ENCOUNTER — Other Ambulatory Visit: Payer: Self-pay

## 2020-09-05 VITALS — BP 122/84 | HR 75 | Ht 63.0 in | Wt 144.6 lb

## 2020-09-05 DIAGNOSIS — Z79899 Other long term (current) drug therapy: Secondary | ICD-10-CM

## 2020-09-05 DIAGNOSIS — E785 Hyperlipidemia, unspecified: Secondary | ICD-10-CM

## 2020-09-05 DIAGNOSIS — I10 Essential (primary) hypertension: Secondary | ICD-10-CM | POA: Diagnosis not present

## 2020-09-05 LAB — LIPID PANEL
Chol/HDL Ratio: 5.1 ratio — ABNORMAL HIGH (ref 0.0–4.4)
Cholesterol, Total: 208 mg/dL — ABNORMAL HIGH (ref 100–199)
HDL: 41 mg/dL (ref >39)
LDL Chol Calc (NIH): 140 mg/dL — ABNORMAL HIGH (ref 0–99)
Triglycerides: 150 mg/dL — ABNORMAL HIGH (ref 0–149)
VLDL Cholesterol Cal: 27 mg/dL (ref 5–40)

## 2020-09-05 LAB — TSH: TSH: 2.24 u[IU]/mL (ref 0.450–4.500)

## 2020-09-05 NOTE — Patient Instructions (Signed)
Medication Instructions:  The current medical regimen is effective;  continue present plan and medications.  *If you need a refill on your cardiac medications before your next appointment, please call your pharmacy*  Lab Work: Please have blood work today (Lipid,TSH)  If you have labs (blood work) drawn today and your tests are completely normal, you will receive your results only by: Marland Kitchen MyChart Message (if you have MyChart) OR . A paper copy in the mail If you have any lab test that is abnormal or we need to change your treatment, we will call you to review the results.  Testing/Procedures: Your physician has requested that you have a stress echocardiogram. For further information please visit [email or web address]. Please follow instruction sheet as given. You will need Covid screening before this test which is completed at [address], [place].  This is a drive through location.  Follow the cones, pull under the portico and remain in your vehicle.  Someone will come to you.  Once this test has been completed you will need to self quarantine until the time of your stress test.  Follow-Up: At [facility] you and your health needs are our priority.  As part of our continuing mission to provide you with exceptional heart care, we have created designated Provider Care Teams.  These Care Teams include your primary Cardiologist (physician) and Advanced Practice Providers (APPs -  Physician Assistants and Nurse Practitioners) who all work together to provide you with the care you need, when you need it.  We recommend signing up for the patient portal called "MyChart".  Sign up information is provided on this After Visit Summary.  MyChart is used to connect with patients for Virtual Visits (Telemedicine).  Patients are able to view lab/test results, encounter notes, upcoming appointments, etc.  Non-urgent messages can be sent to your provider as well.   To learn more about what you can do  with MyChart, go to [email or web address].    Your next appointment:   Follow up will be based on the results of the above testing.  Thank you for choosing [facility]!!

## 2020-09-05 NOTE — Progress Notes (Signed)
.    Cardiology Office Note   Date:  09/05/2020   ID:  Molly Peterson, DOB Sep 06, 1965, MRN [id number]  PCP:  [name], MD  Cardiologist:   Dorris Carnes, MD   F/U of HTN and chest tightness   History of Present Illness: Molly Peterson is a 55 y.o. female with a history of HTN, palpitations, chest pressure  Echo in 2020LVEF 60 to 65% with Gr I diastolic dysfunction.   The pt had a cronary CT angiogram in 2019    This showed no coronary calcdifcations;but  minimal  plaquing of the aorta   The pt had problems with lisinopril  Had swelling of hands and feet.  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 ALPRAZolam (XANAX) 0.25 MG tablet Take 1 tablet (0.25 mg total) by mouth as needed for anxiety.  Marland Kitchen ezetimibe (ZETIA) 10 MG tablet Take 1 tablet (10 mg total) by mouth daily. Please keep upcoming appt in September with Dr. Harrington Challenger before anymore refills. Thank you  . Lactobacillus (PROBIOTIC ACIDOPHILUS PO) Take 1 capsule by mouth daily.  . Loratadine 10 MG CAPS Take by mouth daily.   . metoprolol succinate (TOPROL-XL) 50 MG 24 hr tablet TAKE 1 TABLET BY MOUTH DAILY WITH OR IMMEDIATLEY FOLLOWING A MEAL  . NON FORMULARY Compound medication progestrone  . NON FORMULARY Compound medication estriol  . risedronate (ACTONEL) 150 MG tablet Take 150 mg by mouth every 30 (thirty) days.   Marland Kitchen spironolactone  (ALDACTONE) 25 MG tablet TAKE 0.5 TABLET EVERY DAY     Allergies:   Latex and Sulfa antibiotics   Past Medical History:  Diagnosis Date  . Acute pharyngitis   . Allergy   . ANXIETY 09/18/2006  . DEPRESSION 09/18/2006  . DERMATITIS, SEBORRHEIC NEC 01/06/2007  . Edema    symptom  . Flatulence, eructation, and gas pain   . Hyperlipidemia   . HYPERTENSION 09/18/2006  . Palpitations 09/18/2006  . REACTION, ACUTE STRESS W/EMOTIONAL DSTURB 12/23/2006  . Unspecified constipation   . Urinary tract infection, site not specified     Past Surgical History:  Procedure Laterality Date  . ABDOMINAL HYSTERECTOMY     partial-still has ovaries  . APPENDECTOMY  unknown  . CESAREAN SECTION     x 2  . CHOLECYSTECTOMY    . TONSILLECTOMY  unknown     Social History:  The patient  reports that she quit smoking about 16 years ago. Her smoking use included cigarettes. She has a 4.00 pack-year smoking history. She has never used smokeless tobacco. She reports current alcohol use. She reports that she does not use drugs.   Family  History:  The patient's family history includes Asthma in her father; Atrial fibrillation (age of onset: 51) in her mother; Dementia in her maternal grandmother; Depression in her son; Hearing loss in her father; Heart attack in her maternal grandfather and paternal grandfather; Heart attack (age of onset: 31) in her father; Heart disease in her father and mother; Heart failure in her maternal grandfather; Hyperlipidemia in her father; Hypertension in her father; Kidney cancer in her father; Stroke (age of onset: 35) in her paternal grandmother.    ROS:  Please see the history of present illness. All other systems are reviewed and  Negative to the above problem except as noted.    PHYSICAL EXAM: VS:  BP 122/84   Pulse 75   Ht 5\' 3"  (1.6 m)   Wt 144 lb 9.6 oz (65.6 kg)   BMI 25.61 kg/m   GEN: Well nourished, well developed, in no acute distress  HEENT: normal  Neck: no JVD; no  carotid bruit Cardiac: RRR; no murmurs  ,no LE edema  Respiratory:  clear to auscultation bilaterally, GI: soft, nontender, nondistended, + BS  No hepatomegaly  MS: no deformity Moving all extremities   Skin: warm and dry, no rash Neuro:  Strength and sensation are intact Psych: euthymic mood, full affect   EKG:  EKG is  ordered    SR 75 bpm     Lipid Panel    Component Value Date/Time   CHOL 211 (H) 05/28/2019 0817   TRIG 132 05/28/2019 0817   HDL 44 05/28/2019 0817   CHOLHDL 4.8 (H) 05/28/2019 0817   CHOLHDL 4.8 06/01/2016 1534   VLDL 32 (H) 06/01/2016 1534   LDLCALC 143 (H) 05/28/2019 0817   LDLDIRECT 140.0 07/21/2014 0839      Wt Readings from Last 3 Encounters:  09/05/20 144 lb 9.6 oz (65.6 kg)  06/29/20 142 lb 12 oz (64.8 kg)  06/24/20 142 lb 12.8 oz (64.8 kg)      ASSESSMENT AND PLAN:  1  HTN   BP has been labile   Exacerbated by stress  Good today    I would set up for stress echo to watch BP response with exercise Consider trial of low dose amlodipine  May cut back on b blocker a bit   No changes for now   2  Chest discomfort   Discomfort not with physical activity  Occurs when BP high   With stress   Will follow with sterss echo    Do stress test on meds   2  Dyspnea.  None on level ground   Only with stairs    3  PAD   Reviewed CT with pt   Her Calcium score was 0 but she has minimal plaquing in aorta    Will check lipdis      4  Lipids  Check lipids  She is currently on Zetia  F/U based on results         F/U in clinic in 1 year   Current medicines are reviewed at length with the patient today.  The patient does not have concerns regarding medicines.  Signed, Dorris Carnes, MD  09/05/2020 10:57 AM    [address], [place], [place]  [address] Phone: [phone]; Fax: [phone]

## 2020-09-06 ENCOUNTER — Ambulatory Visit
Admission: RE | Admit: 2020-09-06 | Discharge: 2020-09-06 | Disposition: A | Discharge status: Home / Self Care | Payer: BC Managed Care – PPO | Source: Ambulatory Visit | Attending: Obstetrics and Gynecology | Admitting: Obstetrics and Gynecology

## 2020-09-06 DIAGNOSIS — Z1231 Encounter for screening mammogram for malignant neoplasm of breast: Secondary | ICD-10-CM | POA: Diagnosis not present

## 2020-09-06 DIAGNOSIS — M7051 Other bursitis of knee, right knee: Secondary | ICD-10-CM | POA: Diagnosis not present

## 2020-09-06 DIAGNOSIS — M25561 Pain in right knee: Secondary | ICD-10-CM | POA: Diagnosis not present

## 2020-09-13 ENCOUNTER — Telehealth: Payer: Self-pay

## 2020-09-13 ENCOUNTER — Other Ambulatory Visit: Payer: Self-pay | Admitting: *Deleted

## 2020-09-13 ENCOUNTER — Telehealth: Payer: BC Managed Care – PPO | Admitting: Medical

## 2020-09-13 ENCOUNTER — Ambulatory Visit: Payer: BC Managed Care – PPO

## 2020-09-13 ENCOUNTER — Other Ambulatory Visit: Payer: Self-pay

## 2020-09-13 DIAGNOSIS — J029 Acute pharyngitis, unspecified: Secondary | ICD-10-CM

## 2020-09-13 DIAGNOSIS — Z20822 Contact with and (suspected) exposure to covid-19: Secondary | ICD-10-CM

## 2020-09-13 DIAGNOSIS — J3489 Other specified disorders of nose and nasal sinuses: Secondary | ICD-10-CM

## 2020-09-13 DIAGNOSIS — E785 Hyperlipidemia, unspecified: Secondary | ICD-10-CM

## 2020-09-13 DIAGNOSIS — Z9109 Other allergy status, other than to drugs and biological substances: Secondary | ICD-10-CM

## 2020-09-13 DIAGNOSIS — R21 Rash and other nonspecific skin eruption: Secondary | ICD-10-CM

## 2020-09-13 LAB — POC COVID19 BINAXNOW: SARS Coronavirus 2 Ag: NEGATIVE

## 2020-09-13 MED ORDER — ROSUVASTATIN CALCIUM 5 MG PO TABS: 5.0000 mg | ORAL_TABLET | Freq: Every day | ORAL | 3 refills | Status: DC

## 2020-09-13 NOTE — Progress Notes (Signed)
Subjective:    Patient ID: Molly Peterson, female    DOB: 1965-07-20, 55 y.o.   MRN: [id number]  HPI 55 yo female in non acute distess consents to telemedicine appointment. States she has had a close contact exposure with riding in a car with a person who tested Covid-19 positive her exposure consisted of riding  in  a vehicle  25 minutes and worked together 45 min. (HR sent patient over to be tested)., no masks were worn.    Patient states she has a ST on and off due to her all year round due to  allergies.   With runny nose and cough.Takes Claritn and Flonase all year.   Vaccinated and boosted Pfizer O2 sat 98%   Allergies  Allergen Reactions  . Latex Itching and Swelling  . Sulfa Antibiotics     Dizziness   Review of Systems  Constitutional: Negative for chills and fever.  HENT: Positive for congestion, postnasal drip, rhinorrhea and sore throat. Negative for ear pain, sinus pressure, sinus pain and sneezing.   Respiratory: Positive for cough (non productive). Negative for chest tightness, shortness of breath and wheezing.   Cardiovascular: Negative for chest pain.  Gastrointestinal: Positive for abdominal pain (yesterday). Negative for constipation and diarrhea.  Musculoskeletal: Negative for myalgias.  Skin: Positive for rash (upper chest between breasts and neck area with erythema as the night came on redness reduced , then a raised prickly rash occurred she use hydrocoritsone cream on the area.  Now  with pinkness to the throat area. improving.).  Allergic/Immunologic: Positive for environmental allergies.  Neurological: Negative for dizziness, syncope, light-headedness and headaches.   Started a new lotion x 5- 7 days. Recent mammogram which  She wonders if they used latex gloves she cannot recall the glove color.   She describes her throat "like razor blades when I swallow" in the evenings and when laying down. And it seems to be worsening since yesterday.      Objective:   Physical Exam AXOX3 No distress noted during phone call. No physical performed due to telemedicine appointment.   Results for orders placed or performed in visit on 09/13/20 (from the past 24 hour(s))  POC COVID-19     Status: Normal   Collection Time: 09/13/20  1:40 PM  Result Value Ref Range   SARS Coronavirus 2 Ag Negative Negative       Assessment & Plan:  Covid-19  Screening Pending Covid-19 PCR , I will contacat patient with results.In the meantime she is to isolate, rest , increase fluids. Allergies all year long Continue daily allergy medications. Rash, continue hydrocortisone cream twice daily x 5-7 days.  Pharyngitis, Dilute salt water gargles 3-4 times a day, soft foods. Avoid acidic foods.OTC Motrin or Tylenol for pain or fever.   Patient verbalizes understanding and has no questions at the end of our phone conversation.  Orders Placed This Encounter  Procedures  . Novel Coronavirus, NAA ([facility])    Order Specific Question:   Is this test for diagnosis or screening    Answer:   Diagnosis of ill patient    Order Specific Question:   Symptomatic for COVID-19 as defined by CDC    Answer:   Yes    Order Specific Question:   Date of Symptom Onset    Answer:   09/12/2020    Order Specific Question:   Hospitalized for COVID-19    Answer:   No    Order Specific Question:  Admitted to ICU for COVID-19    Answer:   No    Order Specific Question:   Previously tested for COVID-19    Answer:   Yes    Order Specific Question:   Resident in a congregate (group) care setting    Answer:   No    Order Specific Question:   Is the patient student?    Answer:   No    Order Specific Question:   Employed in healthcare setting    Answer:   No    Order Specific Question:   Pregnant    Answer:   No    Order Specific Question:   Has patient completed COVID vaccination(s) (2 doses of Pfizer/Moderna 1 dose of [employer])    Answer:   Yes    Order Specific  Question:   Has patient completed COVID Booster / 3rd dose    Answer:   Yes    Order Specific Question:   Release to patient    Answer:   Immediate

## 2020-09-13 NOTE — Progress Notes (Signed)
Message from Dr. Harrington Challenger that pt is to stop zetia and begin crestor 5 mg daily with follow up lipids/liver in 8 weeks.  Prescription for Crestor sent to pharmacy.  Message to pt to adv med has been sent and to schedule follow up lab.

## 2020-09-13 NOTE — Telephone Encounter (Signed)
Spoke with patient on the phone regarding quarantine precautions and guidelines. Patient advised to maintain quarantine and isolation until PCR results come back. Will follow up then.

## 2020-09-14 ENCOUNTER — Telehealth: Payer: Self-pay | Admitting: Medical

## 2020-09-14 DIAGNOSIS — M7051 Other bursitis of knee, right knee: Secondary | ICD-10-CM | POA: Insufficient documentation

## 2020-09-14 LAB — NOVEL CORONAVIRUS, NAA: SARS-CoV-2, NAA: NOT DETECTED

## 2020-09-14 LAB — SARS-COV-2, NAA 2 DAY TAT

## 2020-09-14 NOTE — Telephone Encounter (Signed)
Called patient to inform of Covid-19 PCR  Result was negative.  She is a close contact, no symptoms now. She is vaccinated and boosted [employer]. She may return to work  But needs to wear a mask x 10 days. If she presents with symptoms she is to get tested as soon as possible. Reviewed with patient she may return to this clinic if needing to be retested. Patient verbalizes understanding and has no questions at the end of our conversation.

## 2020-09-16 DIAGNOSIS — M5416 Radiculopathy, lumbar region: Secondary | ICD-10-CM | POA: Diagnosis not present

## 2020-09-20 DIAGNOSIS — N644 Mastodynia: Secondary | ICD-10-CM | POA: Diagnosis not present

## 2020-09-20 NOTE — Patient Instructions (Signed)

## 2020-09-28 ENCOUNTER — Other Ambulatory Visit: Payer: Self-pay | Admitting: Internal Medicine

## 2020-09-28 DIAGNOSIS — R079 Chest pain, unspecified: Secondary | ICD-10-CM

## 2020-10-04 ENCOUNTER — Other Ambulatory Visit
Admission: RE | Admit: 2020-10-04 | Discharge: 2020-10-04 | Disposition: A | Discharge status: Home / Self Care | Payer: BC Managed Care – PPO | Source: Ambulatory Visit | Attending: Internal Medicine | Admitting: Internal Medicine

## 2020-10-04 ENCOUNTER — Other Ambulatory Visit: Payer: Self-pay

## 2020-10-04 DIAGNOSIS — Z20822 Contact with and (suspected) exposure to covid-19: Secondary | ICD-10-CM | POA: Insufficient documentation

## 2020-10-04 DIAGNOSIS — Z01812 Encounter for preprocedural laboratory examination: Secondary | ICD-10-CM | POA: Diagnosis not present

## 2020-10-04 LAB — SARS CORONAVIRUS 2 (TAT 6-24 HRS): SARS Coronavirus 2: NEGATIVE

## 2020-10-04 IMAGING — CT CT ABDOMEN AND PELVIS WITH CONTRAST
2 of 5 series · 15 of 46 positions shown, 17 images · IV contrast (OMNIPAQUE 300)
Comparison: MRI pelvis from 04/15/2008

CLINICAL DATA: Left lower quadrant cramping, abdominal pain, and
constipation, increasing in frequency.

EXAM:
CT ABDOMEN AND PELVIS WITH CONTRAST
TECHNIQUE: Multidetector CT imaging of the abdomen and pelvis was performed
using the standard protocol following bolus administration of
intravenous contrast.
CONTRAST:  100mL OMNIPAQUE IOHEXOL 300 MG/ML  SOLN

[Series 2: abd/pel w · axial · 0.65mm/px · z∈[-475,-60]mm · 12 of 93 slices shown, 14 images]
[im 5/93  soft-tissue]
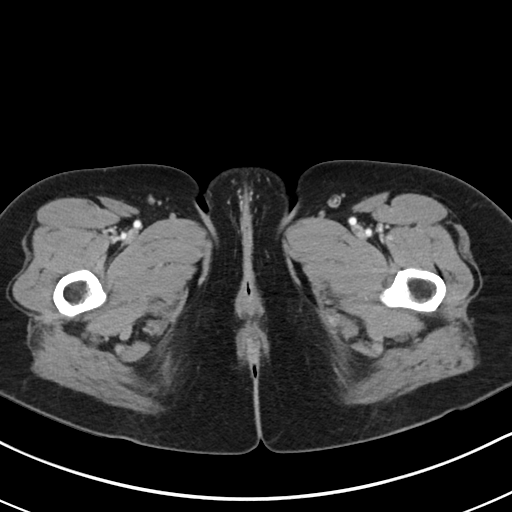
[im 5/93  bone]
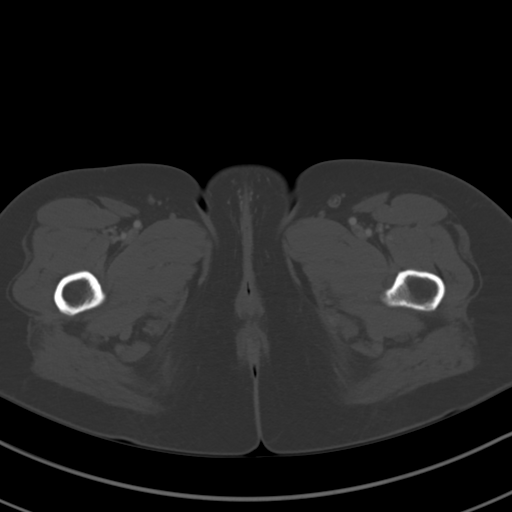
[im 14/93  soft-tissue]
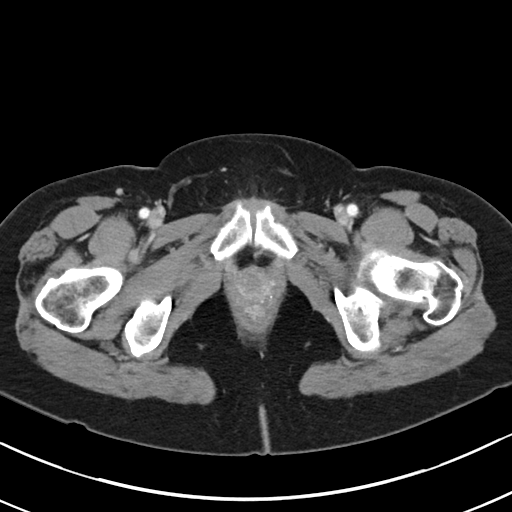
[im 19/93  soft-tissue]
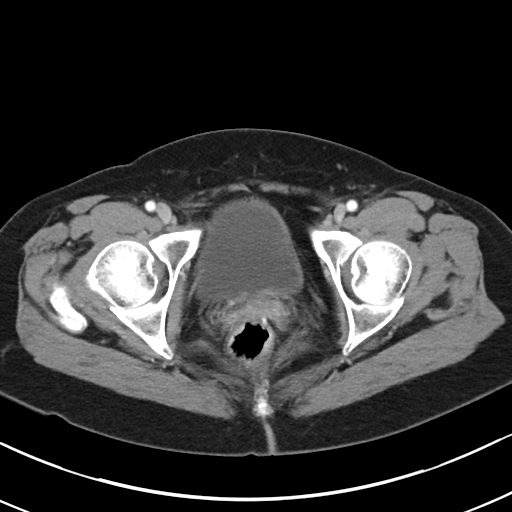
[im 28/93  soft-tissue]
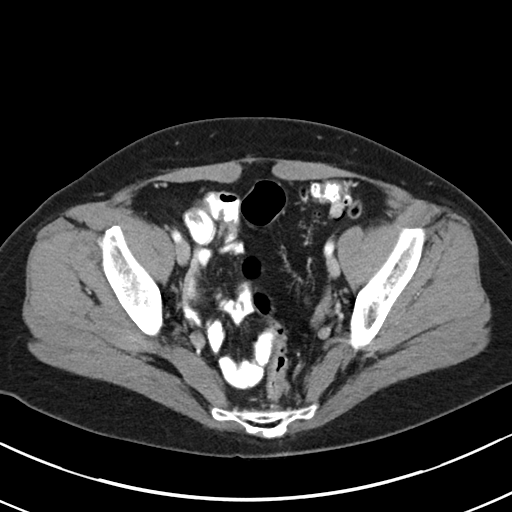
[im 37/93  soft-tissue]
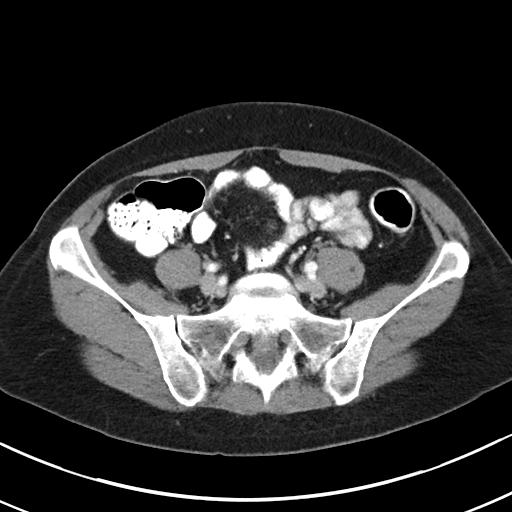
[im 42/93  soft-tissue]
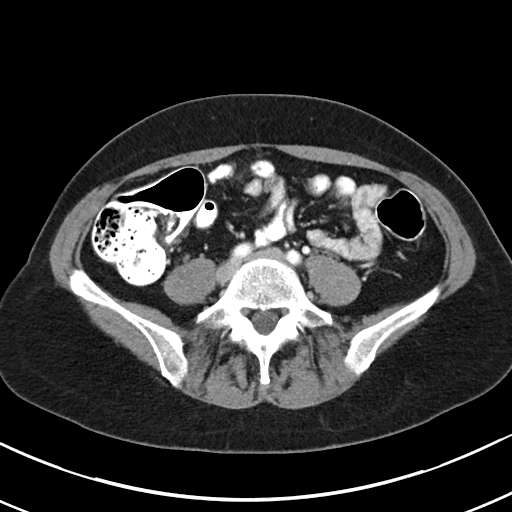
[im 51/93  soft-tissue]
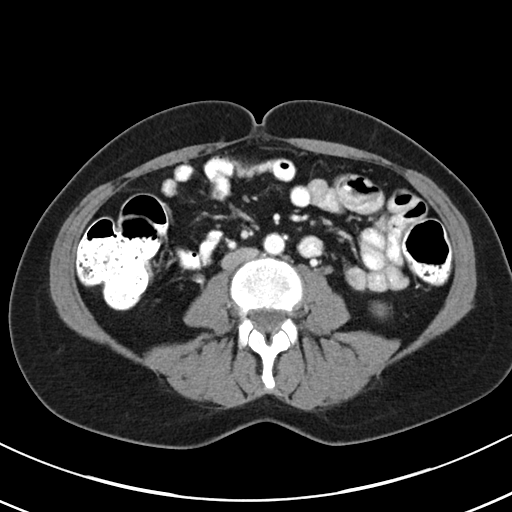
[im 56/93  soft-tissue]
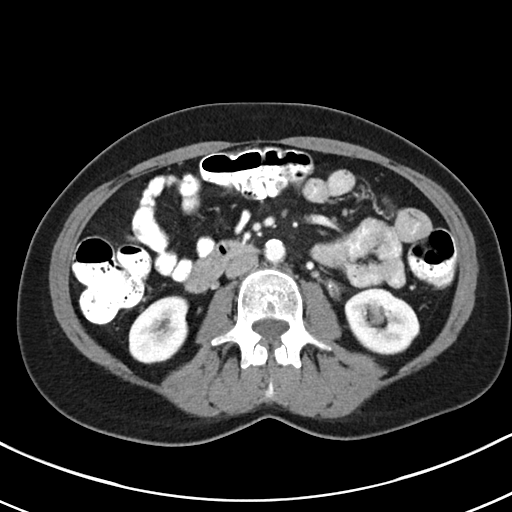
[im 65/93  soft-tissue]
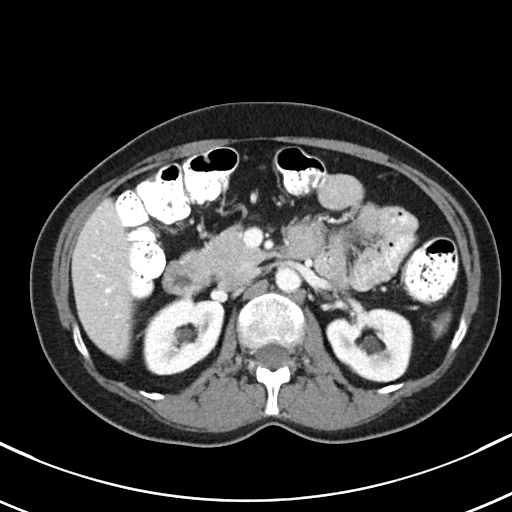
[im 65/93  bone]
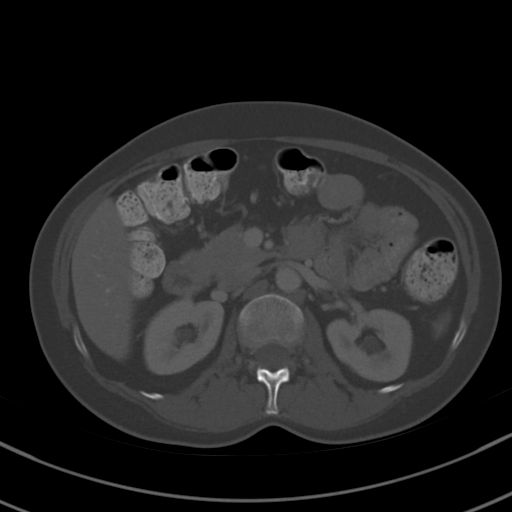
[im 74/93  soft-tissue]
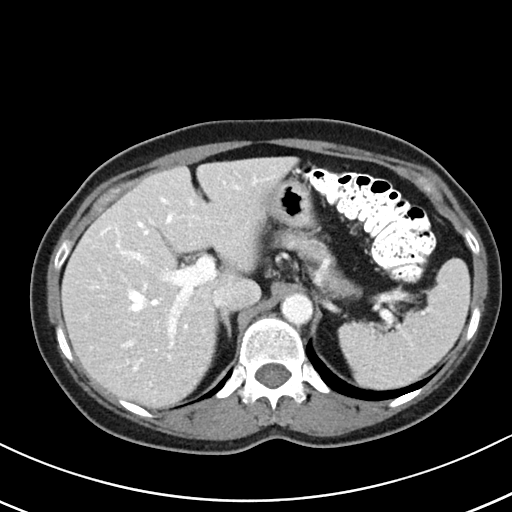
[im 79/93  soft-tissue]
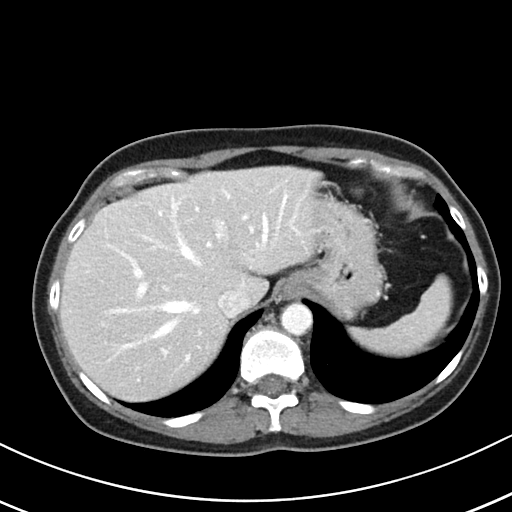
[im 88/93  soft-tissue]
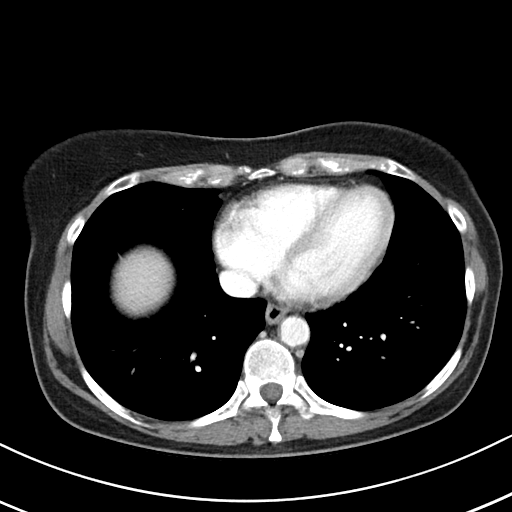

[Series 6: abd/pel w st · coronal · 0.63mm/px · 3 of 70 slices shown]
[im 24/70  soft-tissue]
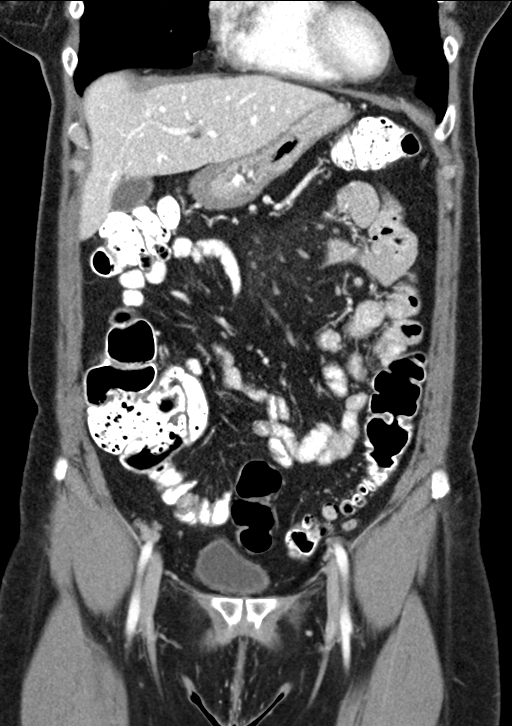
[im 31/70  soft-tissue]
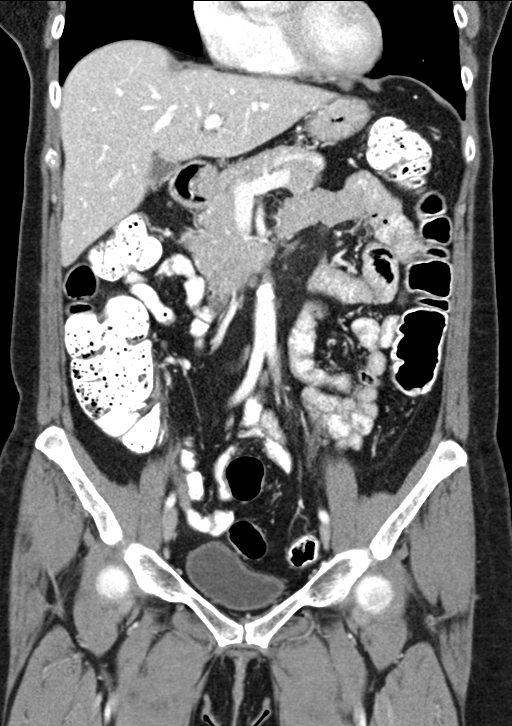
[im 39/70  soft-tissue]
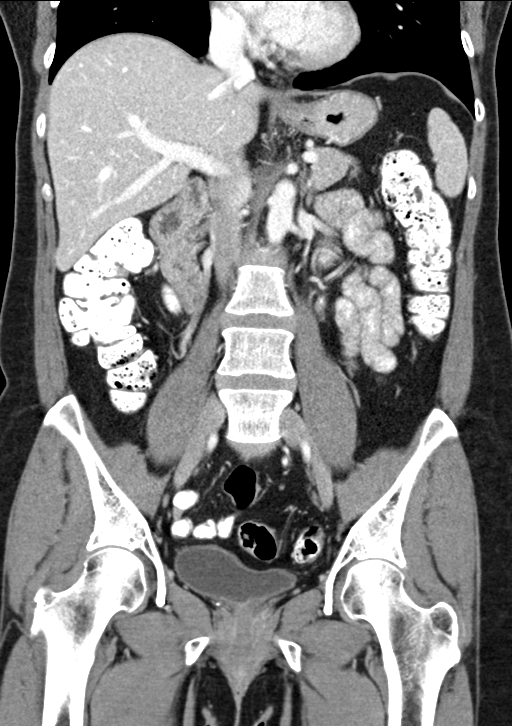

[15 of 46 positions shown; findings below may reference images not displayed]

FINDINGS: Lower chest: Unremarkable

Hepatobiliary: 0.7 by 0.5 by 0.5 cm non dependent filling defect in
the gallbladder on image [DATE]. Otherwise unremarkable.

Pancreas: At least partial pancreas divisum is suspected. Otherwise
unremarkable.

Spleen: Unremarkable

Adrenals/Urinary Tract: Both adrenal glands appear normal. 0.6 cm
hypodense lesion in the right mid kidney on image [DATE] is
technically too small to characterize but statistically likely to be
a small benign cyst.

A 0.5 by 0.5 by 0.3 cm central calcification compatible with
calculus is present in the left proximal ureter on image 39/2, with
mild adjacent accentuated enhancement of the ureteral urothelium. No
other urinary tract calculi are observed. There is borderline left
hydronephrosis. No significant delayed excretion, and on delayed
images contrast medium is noted to extend distal to the stone.

Stomach/Bowel: Mild sigmoid colon diverticulosis.

Vascular/Lymphatic: Aortoiliac atherosclerotic vascular disease.

Reproductive: Uterus absent.  Adnexa unremarkable.

Other: No supplemental non-categorized findings.

Musculoskeletal: Unremarkable
IMPRESSION: 1. 5 by 5 by 3 mm left proximal ureteral calculus associated with
borderline left hydronephrosis, but with contrast medium extending
distal to the stone on delayed images.
2. 7 by 5 by 5 mm non-dependent filling defect in the gallbladder on
image [DATE]. This is most likely a polyp, less likely adherent
gallstone. Follow up sonography is recommended in 6 months time.
This recommendation complies with the 0341 joint guidelines between
the European Society of Gastrointestinal and Abdominal Radiology
(SEFERINO), European Association for Endoscopic Surgery and other
Interventional Techniques (EAES), International Society of Digestive
Surgery - European Federation (EFISDS) and European Society of
Gastrointestinal Endoscopy (ESGE).
3.  Aortic Atherosclerosis ([id number]).
4. Mild sigmoid colon diverticulosis.
5. At least partial pancreas divisum.

## 2020-10-05 ENCOUNTER — Other Ambulatory Visit: Payer: BC Managed Care – PPO

## 2020-10-10 ENCOUNTER — Other Ambulatory Visit: Payer: BC Managed Care – PPO

## 2020-10-11 ENCOUNTER — Encounter: Payer: Self-pay | Admitting: Medical

## 2020-10-11 ENCOUNTER — Ambulatory Visit: Payer: BC Managed Care – PPO | Admitting: Medical

## 2020-10-11 ENCOUNTER — Other Ambulatory Visit: Payer: Self-pay

## 2020-10-11 VITALS — BP 102/74 | HR 66 | Resp 16

## 2020-10-11 DIAGNOSIS — T7840XA Allergy, unspecified, initial encounter: Secondary | ICD-10-CM

## 2020-10-11 MED ORDER — PREDNISONE 10 MG (21) PO TBPK: ORAL_TABLET | ORAL | 0 refills | Status: DC

## 2020-10-11 NOTE — Patient Instructions (Signed)
Contact Dermatitis Dermatitis is redness, soreness, and swelling (inflammation) of the skin. Contact dermatitis is a reaction to something that touches the skin. There are two types of contact dermatitis:  Irritant contact dermatitis. This happens when something bothers (irritates) your skin, like soap.  Allergic contact dermatitis. This is caused when you are exposed to something that you are allergic to, such as poison ivy. What are the causes?  Common causes of irritant contact dermatitis include: ? Makeup. ? Soaps. ? Detergents. ? Bleaches. ? Acids. ? Metals, such as nickel.  Common causes of allergic contact dermatitis include: ? Plants. ? Chemicals. ? Jewelry. ? Latex. ? Medicines. ? Preservatives in products, such as clothing. What increases the risk?  Having a job that exposes you to things that bother your skin.  Having asthma or eczema. What are the signs or symptoms? Symptoms may happen anywhere the irritant has touched your skin. Symptoms include:  Dry or flaky skin.  Redness.  Cracks.  Itching.  Pain or a burning feeling.  Blisters.  Blood or clear fluid draining from skin cracks. With allergic contact dermatitis, swelling may occur. This may happen in places such as the eyelids, mouth, or genitals.   How is this treated?  This condition is treated by checking for the cause of the reaction and protecting your skin. Treatment may also include: ? Steroid creams, ointments, or medicines. ? Antibiotic medicines or other ointments, if you have a skin infection. ? Lotion or medicines to help with itching. ? A bandage (dressing). Follow these instructions at home: Skin care  Moisturize your skin as needed.  Put cool cloths on your skin.  Put a baking soda paste on your skin. Stir water into baking soda until it looks like a paste.  Do not scratch your skin.  Avoid having things rub up against your skin.  Avoid the use of soaps, perfumes, and  dyes. Medicines  Take or apply over-the-counter and prescription medicines only as told by your doctor.  If you were prescribed an antibiotic medicine, take or apply it as told by your doctor. Do not stop using it even if your condition starts to get better. Bathing  Take a bath with: ? Epsom salts. ? Baking soda. ? Colloidal oatmeal.  Bathe less often.  Bathe in warm water. Avoid using hot water. Bandage care  If you were given a bandage, change it as told by your health care provider.  Wash your hands with soap and water before and after you change your bandage. If soap and water are not available, use hand sanitizer. General instructions  Avoid the things that caused your reaction. If you do not know what caused it, keep a journal. Write down: ? What you eat. ? What skin products you use. ? What you drink. ? What you wear in the area that has symptoms. This includes jewelry.  Check the affected areas every day for signs of infection. Check for: ? More redness, swelling, or pain. ? More fluid or blood. ? Warmth. ? Pus or a bad smell.  Keep all follow-up visits as told by your doctor. This is important. Contact a doctor if:  You do not get better with treatment.  Your condition gets worse.  You have signs of infection, such as: ? More swelling. ? Tenderness. ? More redness. ? Soreness. ? Warmth.  You have a fever.  You have new symptoms. Get help right away if:  You have a very bad headache.  You have  neck pain.  Your neck is stiff.  You throw up (vomit).  You feel very sleepy.  You see red streaks coming from the area.  Your bone or joint near the area hurts after the skin has healed.  The area turns darker.  You have trouble breathing. Summary  Dermatitis is redness, soreness, and swelling of the skin.  Symptoms may occur where the irritant has touched you.  Treatment may include medicines and skin care.  If you do not know what caused  your reaction, keep a journal.  Contact a doctor if your condition gets worse or you have signs of infection. This information is not intended to replace advice given to you by your health care provider. Make sure you discuss any questions you have with your health care provider. Document Revised: 09/24/2018 Document Reviewed: 12/18/2017 Elsevier Patient Education  [employer].

## 2020-10-11 NOTE — Progress Notes (Signed)
   Subjective:    Patient ID: Molly Peterson, female    DOB: 10-24-1965, 55 y.o.   MRN: [id number]  HPI  55 yo female in non acute distress. Presents to clinic with rash on lower extremities including back of knees and right lower arm. Her main complaint is itching , denies tenderness. Started taking AmLaactin latic acid restoring cream  (OTC) recommended by her dermatiolgist. And also used a  Body scrub called [employer] Vitamin C scrub. At the same time. Rssh showed up on Thursday , she did the treatkment on Wednesday and Thursday.   Review of Systems  Constitutional: Negative for chills and fever.  HENT:       No throat swelling  Respiratory: Negative for shortness of breath.   Cardiovascular: Negative for chest pain.   Itchy but not pain.     Objective:   Physical Exam Vitals and nursing note reviewed.  Constitutional:      Appearance: Normal appearance.  HENT:     Head: Normocephalic and atraumatic.  Eyes:     Extraocular Movements: Extraocular movements intact.     Conjunctiva/sclera: Conjunctivae normal.     Pupils: Pupils are equal, round, and reactive to light.  Pulmonary:     Effort: Pulmonary effort is normal.  Musculoskeletal:        General: No tenderness. Normal range of motion.     Right lower leg: No edema.     Left lower leg: No edema.  Skin:    General: Skin is warm and dry.     Findings: Erythema and rash present.  Neurological:     General: No focal deficit present.     Mental Status: She is alert and oriented to person, place, and time. Mental status is at baseline.  Psychiatric:        Mood and Affect: Mood normal.        Behavior: Behavior normal.        Thought Content: Thought content normal.        Judgment: Judgment normal.      Maculopapular rach on lower legs and right posterior lower arm. Mild warmth.    Assessment & Plan:  Contact Dermatiis most likely  An allergy, difficult to determine which lotion caused this since she used  both at the same time. If you desire to retry the lotions, do a patch test with just one lotion x 2-5 days to see if you get a reaction. Stop Claritin and switch to Zyrtec to help with itching. Take Benadryl  25 mg , take one tablet at bedtime.. Cool showers avoid heat. Meds ordered this encounter  Medications  . predniSONE (STERAPRED UNI-PAK 21 TAB) 10 MG (21) TBPK tablet    Sig: Take  6 tablets by mouth today then 5 tablets tomorrow then one less tablet each day there after.    Dispense:  21 tablet    Refill:  0  Shlould resolve or improve in the next  3-5 days if not return or call the clinic.  Explained to patient due to two different creams used together it is hard to tell which one gave her the reaction.  Patient verbalizes understanding and has no questions at discharge.

## 2020-10-14 ENCOUNTER — Other Ambulatory Visit (HOSPITAL_COMMUNITY): Payer: BC Managed Care – PPO

## 2020-10-18 ENCOUNTER — Other Ambulatory Visit (HOSPITAL_COMMUNITY): Payer: BC Managed Care – PPO

## 2020-10-20 ENCOUNTER — Ambulatory Visit: Payer: BC Managed Care – PPO | Admitting: Internal Medicine

## 2020-10-22 ENCOUNTER — Emergency Department (HOSPITAL_COMMUNITY): Payer: BC Managed Care – PPO

## 2020-10-22 ENCOUNTER — Other Ambulatory Visit: Payer: Self-pay

## 2020-10-22 ENCOUNTER — Encounter (HOSPITAL_COMMUNITY): Payer: Self-pay

## 2020-10-22 ENCOUNTER — Emergency Department (HOSPITAL_COMMUNITY)
Admission: EM | Admit: 2020-10-22 | Discharge: 2020-10-23 | Disposition: A | Discharge status: Home / Self Care | Payer: BC Managed Care – PPO | Attending: Emergency Medicine | Admitting: Emergency Medicine

## 2020-10-22 DIAGNOSIS — R109 Unspecified abdominal pain: Secondary | ICD-10-CM | POA: Diagnosis not present

## 2020-10-22 DIAGNOSIS — Z91040 Latex allergy status: Secondary | ICD-10-CM | POA: Insufficient documentation

## 2020-10-22 DIAGNOSIS — Z79899 Other long term (current) drug therapy: Secondary | ICD-10-CM | POA: Diagnosis not present

## 2020-10-22 DIAGNOSIS — I10 Essential (primary) hypertension: Secondary | ICD-10-CM | POA: Diagnosis not present

## 2020-10-22 DIAGNOSIS — K5750 Diverticulosis of both small and large intestine without perforation or abscess without bleeding: Secondary | ICD-10-CM | POA: Diagnosis not present

## 2020-10-22 DIAGNOSIS — I700 Atherosclerosis of aorta: Secondary | ICD-10-CM | POA: Diagnosis not present

## 2020-10-22 DIAGNOSIS — Z87891 Personal history of nicotine dependence: Secondary | ICD-10-CM | POA: Diagnosis not present

## 2020-10-22 DIAGNOSIS — K5730 Diverticulosis of large intestine without perforation or abscess without bleeding: Secondary | ICD-10-CM | POA: Diagnosis not present

## 2020-10-22 LAB — CBC WITH DIFFERENTIAL/PLATELET
Abs Immature Granulocytes: 0.04 10*3/uL (ref 0.00–0.07)
Basophils Absolute: 0 10*3/uL (ref 0.0–0.1)
Basophils Relative: 0 %
Eosinophils Absolute: 0.3 10*3/uL (ref 0.0–0.5)
Eosinophils Relative: 3 %
HCT: 42.5 % (ref 36.0–46.0)
Hemoglobin: 14 g/dL (ref 12.0–15.0)
Immature Granulocytes: 0 %
Lymphocytes Relative: 36 %
Lymphs Abs: 3.6 10*3/uL (ref 0.7–4.0)
MCH: 29.9 pg (ref 26.0–34.0)
MCHC: 32.9 g/dL (ref 30.0–36.0)
MCV: 90.6 fL (ref 80.0–100.0)
Monocytes Absolute: 0.7 10*3/uL (ref 0.1–1.0)
Monocytes Relative: 7 %
Neutro Abs: 5.4 10*3/uL (ref 1.7–7.7)
Neutrophils Relative %: 54 %
Platelets: 229 10*3/uL (ref 150–400)
RBC: 4.69 MIL/uL (ref 3.87–5.11)
RDW: 12.7 % (ref 11.5–15.5)
WBC: 10.1 10*3/uL (ref 4.0–10.5)
nRBC: 0 % (ref 0.0–0.2)

## 2020-10-22 LAB — COMPREHENSIVE METABOLIC PANEL
ALT: 18 U/L (ref 0–44)
AST: 17 U/L (ref 15–41)
Albumin: 4.3 g/dL (ref 3.5–5.0)
Alkaline Phosphatase: 54 U/L (ref 38–126)
Anion gap: 7 (ref 5–15)
BUN: 19 mg/dL (ref 6–20)
CO2: 27 mmol/L (ref 22–32)
Calcium: 9.1 mg/dL (ref 8.9–10.3)
Chloride: 105 mmol/L (ref 98–111)
Creatinine, Ser: 0.78 mg/dL (ref 0.44–1.00)
GFR, Estimated: >60 mL/min (ref >60)
Glucose, Bld: 82 mg/dL (ref 70–99)
Potassium: 3.6 mmol/L (ref 3.5–5.1)
Sodium: 139 mmol/L (ref 135–145)
Total Bilirubin: 0.5 mg/dL (ref 0.3–1.2)
Total Protein: 7 g/dL (ref 6.5–8.1)

## 2020-10-22 LAB — LIPASE, BLOOD: Lipase: 43 U/L (ref 11–51)

## 2020-10-22 LAB — URINALYSIS, ROUTINE W REFLEX MICROSCOPIC
Bilirubin Urine: NEGATIVE
Glucose, UA: NEGATIVE mg/dL
Hgb urine dipstick: NEGATIVE
Ketones, ur: NEGATIVE mg/dL
Leukocytes,Ua: NEGATIVE
Nitrite: NEGATIVE
Protein, ur: NEGATIVE mg/dL
Specific Gravity, Urine: 1.003 — ABNORMAL LOW (ref 1.005–1.030)
pH: 7 (ref 5.0–8.0)

## 2020-10-22 MED ORDER — NAPROXEN 500 MG PO TABS: 500.0000 mg | ORAL_TABLET | Freq: Two times a day (BID) | ORAL | 0 refills | Status: DC

## 2020-10-22 MED ORDER — LIDOCAINE 5 % EX PTCH: 1.0000 | MEDICATED_PATCH | CUTANEOUS | 0 refills | Status: DC

## 2020-10-22 MED ORDER — OXYCODONE-ACETAMINOPHEN 5-325 MG PO TABS
1.0000 | ORAL_TABLET | Freq: Once | ORAL | Status: AC
  Administered 2020-10-22: 1 via ORAL
  Filled 2020-10-22: qty 1

## 2020-10-22 MED ORDER — CYCLOBENZAPRINE HCL 10 MG PO TABS: 10.0000 mg | ORAL_TABLET | Freq: Two times a day (BID) | ORAL | 0 refills | Status: DC | PRN

## 2020-10-22 MED ORDER — IOHEXOL 350 MG/ML SOLN
100.0000 mL | Freq: Once | INTRAVENOUS | Status: AC | PRN
  Administered 2020-10-22: 100 mL via INTRAVENOUS

## 2020-10-22 NOTE — ED Provider Notes (Signed)
  Provider Note MRN:  [id number]  Arrival date & time: 10/22/20    ED Course and Medical Decision Making  Assumed care from Dr. Almyra Free at shift change.  Flank pain, renal study is negative, no pursuing CTA to exclude dissection.  Overall work-up reassuring, would be a candidate for discharge.  CTA imaging is reassuring, there is question of abnormality to the hysterectomy site but this is seemingly unrelated to the site of patient's pain which is high and on the left flank.  Suspect musculoskeletal etiology.  No rash or hypersensitivity to suggest shingles.  Appropriate for discharge with symptomatic management.  Procedures  Final Clinical Impressions(s) / ED Diagnoses     ICD-10-CM   1. Flank pain  R10.9     ED Discharge Orders         Ordered    cyclobenzaprine (FLEXERIL) 10 MG tablet  2 times daily PRN        10/22/20 2354    naproxen (NAPROSYN) 500 MG tablet  2 times daily        10/22/20 2354    lidocaine (LIDODERM) 5 %  Every 24 hours        10/22/20 2354            Discharge Instructions     You were evaluated in the Emergency Department and after careful evaluation, we did not find any emergent condition requiring admission or further testing in the hospital.  Your exam/testing today was overall reassuring.  Symptoms seem to be due to muscle strain or spasm.  Please use Tylenol 1000 mg every 4-6 hours for pain.  We also recommend taking the Naprosyn anti-inflammatory twice daily.  Use the Flexeril medication for more significant pain keeping her from sleeping at night.  Can use the Lidoderm numbing patches during the day as well.  Please return to the Emergency Department if you experience any worsening of your condition.  Thank you for allowing [place] to be a part of your care.      Barth Kirks. Sedonia Small, [place] [email or web address]    Maudie Flakes, MD 10/22/20 [phone]

## 2020-10-22 NOTE — ED Triage Notes (Addendum)
Pt reports intermittent  right flank pain since Monday. Pt denies N/V/D and urinary problems.

## 2020-10-22 NOTE — Discharge Instructions (Addendum)
You were evaluated in the Emergency Department and after careful evaluation, we did not find any emergent condition requiring admission or further testing in the hospital.  Your exam/testing today was overall reassuring.  Symptoms seem to be due to muscle strain or spasm.  Please use Tylenol 1000 mg every 4-6 hours for pain.  We also recommend taking the Naprosyn anti-inflammatory twice daily.  Use the Flexeril medication for more significant pain keeping her from sleeping at night.  Can use the Lidoderm numbing patches during the day as well.  Please return to the Emergency Department if you experience any worsening of your condition.  Thank you for allowing [place] to be a part of your care.

## 2020-10-22 NOTE — ED Notes (Signed)
Pt called to secretary's desk requesting nurse. Asked pt for specific as to what was needed d/t nurse and tech occupied w/pt transfer in Rm 9. Pt states "I need my nurse." Advised pt nurse will be in shortly. Pt called to secretary's desk approx 2-24min later requesting for nurse. Asked pt for specific as to what was needed d/t nurse and tech still occupied w/pt transfer in Rm 9. Pt begins to yell and states "...I need to go to the restroom; I've been her for 6hrs no one has came in here to see me; you people are not doing...". I request for pt not to yell; pt continues to yell. Call is disconnected and went to pt room to inquire what assistance is needed. Pt begins to speak repeating conversation on call bell. Advised pt assistance will be provided as soon as possible by staff or myself and requested again not to yell. Pt continues to talk over stating "I did not yell; I have been waiting for someone to come here and no one has showed up; it's a shame how you all have done; what is your name; it's your fault my husband can't come back after my mom left...". Provided pt Regulatory affairs officer) name and made attempt(s) to calm and communicate with pt regarding visit/policies/protocol; advised that nurse/tech will be in as soon as possible. Briefed nurse/tech on conversation(s) and encouraged them to speak with pt as soon as possible. Charge RN advised. [employer]

## 2020-10-22 NOTE — ED Provider Notes (Signed)
Emergency Medicine Provider Triage Evaluation Note  Molly Peterson , a 55 y.o. female  was evaluated in triage.  Pt complains of left sided flank pain since Monday, hx of kidney stones in the past however this feels different. No nausea, vomiting, urinary symptoms. Hasn't taken anything for pain as she is scheduled to have an epidural injection 05/29 and was told she cannot take NSAIDs prior.   Review of Systems  Positive: + left flank pain Negative: - nausea, vomiting, urinary symptoms  Physical Exam  BP (!) 143/97 (BP Location: Left Arm)   Pulse 87   Temp 98.5 F (36.9 C) (Oral)   Resp 16   SpO2 100%  Gen:   Awake, no distress   Resp:  Normal effort  MSK:   Moves extremities without difficulty  Other:  + left CVA TTP  Medical Decision Making  Medically screening exam initiated at 5:09 PM.  Appropriate orders placed.  Molly Peterson was informed that the remainder of the evaluation will be completed by another provider, this initial triage assessment does not replace that evaluation, and the importance of remaining in the ED until their evaluation is complete.  Possible kidney stone.    Eustaquio Maize, PA-C 10/22/20 1710    Luna Fuse, MD 10/22/20 2231

## 2020-10-23 DIAGNOSIS — R109 Unspecified abdominal pain: Secondary | ICD-10-CM | POA: Diagnosis not present

## 2020-10-23 MED ORDER — CYCLOBENZAPRINE HCL 10 MG PO TABS
10.0000 mg | ORAL_TABLET | Freq: Once | ORAL | Status: AC
  Administered 2020-10-23: 10 mg via ORAL
  Filled 2020-10-23: qty 1

## 2020-10-23 MED ORDER — CYCLOBENZAPRINE HCL 10 MG PO TABS: 10.0000 mg | ORAL_TABLET | Freq: Two times a day (BID) | ORAL | 0 refills | Status: DC | PRN

## 2020-10-23 MED ORDER — LIDOCAINE 5 % EX PTCH: 1.0000 | MEDICATED_PATCH | CUTANEOUS | 0 refills | Status: DC

## 2020-10-23 MED ORDER — NAPROXEN 500 MG PO TABS
500.0000 mg | ORAL_TABLET | Freq: Once | ORAL | Status: AC
  Administered 2020-10-23: 500 mg via ORAL
  Filled 2020-10-23: qty 1

## 2020-10-23 MED ORDER — NAPROXEN 500 MG PO TABS: 500.0000 mg | ORAL_TABLET | Freq: Two times a day (BID) | ORAL | 0 refills | Status: DC

## 2020-10-23 NOTE — ED Provider Notes (Signed)
[facility] DEPT Provider Note   CSN: [id number] Arrival date & time: 10/22/20  1644     History Chief Complaint  Patient presents with  . Flank Pain    Molly Peterson is a 55 y.o. female.  Pt complains of left sided flank pain since Monday, hx of kidney stones in the past however this feels different. No nausea, vomiting, urinary symptoms. Hasn't taken anything for pain as she is scheduled to have an epidural injection 05/29 and was told she cannot take NSAIDs prior.         Past Medical History:  Diagnosis Date  . Acute pharyngitis   . Allergy   . ANXIETY 09/18/2006  . DEPRESSION 09/18/2006  . DERMATITIS, SEBORRHEIC NEC 01/06/2007  . Edema    symptom  . Flatulence, eructation, and gas pain   . Hyperlipidemia   . HYPERTENSION 09/18/2006  . Palpitations 09/18/2006  . REACTION, ACUTE STRESS W/EMOTIONAL DSTURB 12/23/2006  . Unspecified constipation   . Urinary tract infection, site not specified     Patient Active Problem List   Diagnosis Date Noted  . Memory changes 04/14/2019  . Bilateral arm numbness and tingling while sleeping 08/25/2018  . Chronic back pain 08/25/2018  . Abnormal auditory perception of both ears 01/31/2016  . TMJ pain dysfunction syndrome 01/31/2016  . Essential hypertension 01/31/2016  . Hyperlipidemia with target low density lipoprotein (LDL) cholesterol less than 130 mg/dL 12/19/2011  . CONSTIPATION 10/11/2008  . FLATULENCE-GAS-BLOATING 10/11/2008  . ABDOMINAL PAIN -GENERALIZED 10/11/2008  . DERMATITIS, SEBORRHEIC NEC 01/06/2007  . REACTION, ACUTE STRESS W/EMOTIONAL DSTURB 12/23/2006  . SYMPTOM, EDEMA 12/23/2006  . Anxiety 09/18/2006  . DEPRESSION 09/18/2006  . PALPITATIONS 09/18/2006    Past Surgical History:  Procedure Laterality Date  . ABDOMINAL HYSTERECTOMY     partial-still has ovaries  . APPENDECTOMY  unknown  . CESAREAN SECTION     x 2  . CHOLECYSTECTOMY    . TONSILLECTOMY  unknown     OB  History   No obstetric history on file.     Family History  Problem Relation Age of Onset  . Heart disease Mother        PVC @age  36  . Atrial fibrillation Mother 20  . Heart disease Father        MI @ags  6 , CABG @55   . Hypertension Father   . Hyperlipidemia Father   . Hearing loss Father   . Heart attack Father 53  . Asthma Father   . Kidney cancer Father   . Dementia Maternal Grandmother   . Heart failure Maternal Grandfather   . Heart attack Maternal Grandfather   . Stroke Paternal Grandmother 25  . Heart attack Paternal Grandfather        young  . Depression Son   . Colon cancer Neg Hx   . Esophageal cancer Neg Hx   . Stomach cancer Neg Hx   . Pancreatic cancer Neg Hx     Social History   Tobacco Use  . Smoking status: Former Smoker    Packs/day: 0.25    Years: 16.00    Pack years: 4.00    Types: Cigarettes    Quit date: 06/18/2004    Years since quitting: 16.3  . Smokeless tobacco: Never Used  Vaping Use  . Vaping Use: Never used  Substance Use Topics  . Alcohol use: Yes    Comment: occasionally-no specific pattern, rare  . Drug use: No    Home  Medications Prior to Admission medications   Medication Sig Start Date End Date Taking? Authorizing Provider  ALPRAZolam (XANAX) 0.25 MG tablet Take 1 tablet (0.25 mg total) by mouth as needed for anxiety. Patient taking differently: Take 0.25 mg by mouth daily as needed for anxiety. 04/14/19  Yes [name], MD  ketotifen (ZADITOR) 0.025 % ophthalmic solution Place 1 drop into both eyes daily.   Yes [provider]  Lactobacillus (PROBIOTIC ACIDOPHILUS PO) Take 1 capsule by mouth daily.   Yes [provider]  Loratadine 10 MG CAPS Take 10 mg by mouth daily.   Yes [provider]  metoprolol succinate (TOPROL-XL) 50 MG 24 hr tablet TAKE 1 TABLET BY MOUTH DAILY WITH OR IMMEDIATLEY FOLLOWING A MEAL Patient taking differently: Take 50 mg by mouth daily. 04/11/20  Yes Fay Records, MD   rosuvastatin (CRESTOR) 5 MG tablet Take 1 tablet (5 mg total) by mouth daily. 09/13/20 12/12/20 Yes Fay Records, MD  spironolactone (ALDACTONE) 25 MG tablet TAKE 0.5 TABLET EVERY DAY Patient taking differently: Take 12.5 mg by mouth daily. 08/04/20  Yes Fay Records, MD  cyclobenzaprine (FLEXERIL) 10 MG tablet Take 1 tablet (10 mg total) by mouth 2 (two) times daily as needed for muscle spasms. 10/23/20   Maudie Flakes, MD  lidocaine (LIDODERM) 5 % Place 1 patch onto the skin daily. Remove & Discard patch within 12 hours or as directed by MD 10/23/20   Maudie Flakes, MD  naproxen (NAPROSYN) 500 MG tablet Take 1 tablet (500 mg total) by mouth 2 (two) times daily. 10/23/20   Maudie Flakes, MD  predniSONE (STERAPRED UNI-PAK 21 TAB) 10 MG (21) TBPK tablet Take  6 tablets by mouth today then 5 tablets tomorrow then one less tablet each day there after. Patient not taking: No sig reported 10/11/20   Daryll Drown R, PA-C    Allergies    Latex and Sulfa antibiotics  Review of Systems   Review of Systems  Constitutional: Negative for fever.  HENT: Negative for ear pain.   Eyes: Negative for pain.  Respiratory: Negative for cough.   Cardiovascular: Negative for chest pain.  Gastrointestinal: Negative for abdominal pain.  Genitourinary: Positive for flank pain.  Musculoskeletal: Negative for back pain.  Skin: Negative for rash.  Neurological: Negative for headaches.    Physical Exam Updated Vital Signs BP (!) 132/94   Pulse 71   Temp 98.5 F (36.9 C) (Oral)   Resp 16   SpO2 98%   Physical Exam Constitutional:      Appearance: Normal appearance. She is not toxic-appearing.  HENT:     Head: Normocephalic.     Nose: Nose normal.  Eyes:     Extraocular Movements: Extraocular movements intact.  Cardiovascular:     Rate and Rhythm: Normal rate.  Pulmonary:     Effort: Pulmonary effort is normal.  Musculoskeletal:        General: Normal range of motion.     Cervical back:  Normal range of motion.  Neurological:     General: No focal deficit present.     Mental Status: She is alert. Mental status is at baseline.     ED Results / Procedures / Treatments   Labs (all labs ordered are listed, but only abnormal results are displayed) Labs Reviewed  URINALYSIS, ROUTINE W REFLEX MICROSCOPIC - Abnormal; Notable for the following components:      Result Value   Color, Urine STRAW (*)  Specific Gravity, Urine 1.003 (*)    All other components within normal limits  COMPREHENSIVE METABOLIC PANEL  LIPASE, BLOOD  CBC WITH DIFFERENTIAL/PLATELET    EKG None  Radiology CT Renal Stone Study  Result Date: 10/22/2020 CLINICAL DATA:  Left-sided flank pain EXAM: CT ABDOMEN AND PELVIS WITHOUT CONTRAST TECHNIQUE: Multidetector CT imaging of the abdomen and pelvis was performed following the standard protocol without IV contrast. COMPARISON:  06/30/2020 FINDINGS: Lower chest: No acute abnormality. Hepatobiliary: No focal liver abnormality is seen. Status post cholecystectomy. No biliary dilatation. Pancreas: Unremarkable. No pancreatic ductal dilatation or surrounding inflammatory changes. Spleen: Normal in size without focal abnormality. Adrenals/Urinary Tract: Adrenal glands are within normal limits. Kidneys are well visualized without obstructive change. Ureters are within normal limits. The bladder is decompressed. Stomach/Bowel: Scattered diverticular change of the colon is noted. The appendix has been surgically removed. Small bowel and stomach are within normal limits. Vascular/Lymphatic: Aortic atherosclerosis. No enlarged abdominal or pelvic lymph nodes. Reproductive: Status post hysterectomy. No adnexal masses. Other: No abdominal wall hernia or abnormality. No abdominopelvic ascites. Musculoskeletal: No acute or significant osseous findings. IMPRESSION: Diverticulosis without diverticulitis. No renal calculi or obstructive changes are noted. No acute abnormality is seen.  Electronically Signed   By: Inez Catalina M.D.   On: 10/22/2020 18:47   CT Angio Chest/Abd/Pel for Dissection W and/or W/WO  Result Date: 10/22/2020 CLINICAL DATA:  Abdominal pain. Concern for aortic dissection suspected. Persistent left flank plain. Normal noncontrast study. EXAM: CT ANGIOGRAPHY CHEST, ABDOMEN AND PELVIS TECHNIQUE: Non-contrast CT of the chest was initially obtained. Multidetector CT imaging through the chest, abdomen and pelvis was performed using the standard protocol during bolus administration of intravenous contrast. Multiplanar reconstructed images and MIPs were obtained and reviewed to evaluate the vascular anatomy. CONTRAST:  165mL OMNIPAQUE IOHEXOL 350 MG/ML SOLN COMPARISON:  CT renal 10/22/2020 FINDINGS: CTA CHEST FINDINGS Cardiovascular: Preferential and satisfactory opacification of the pulmonary arteries to the segmental level. No central or segmental pulmonary embolus identified. Also satisfactory opacification of the aorta with no aneurysm or dissection identified. Mild atherosclerotic plaque of the thoracic aorta. No coronary artery calcifications. Normal heart size. No significant pericardial effusion. Mediastinum/Nodes: No enlarged mediastinal, hilar, or axillary lymph nodes. Thyroid gland, trachea, and esophagus demonstrate no significant findings. Lungs/Pleura: No focal consolidation. No pulmonary nodule. No pulmonary mass. No pleural effusion. No pneumothorax. Musculoskeletal: No chest wall abnormality. No suspicious lytic or blastic osseous lesions. No acute displaced fracture. Review of the MIP images confirms the above findings. CTA ABDOMEN AND PELVIS FINDINGS Aorta: Mild atherosclerotic plaques. Normal caliber aorta without aneurysm, dissection, vasculitis or significant stenosis. Celiac: Question replaced left hepatic artery with early takeoff from the common hepatic/celiac artery. Patent without evidence of aneurysm, dissection, vasculitis or significant stenosis. SMA:  Patent without evidence of aneurysm, dissection, vasculitis or significant stenosis. Renals: 2 left renal veins are noted with the inferior 1 diminutive compared to the superior 1. Both renal arteries are patent without evidence of aneurysm, dissection, vasculitis, fibromuscular dysplasia or significant stenosis. IMA: Patent without evidence of aneurysm, dissection, vasculitis or significant stenosis. Inflow: Patent without evidence of aneurysm, dissection, vasculitis or significant stenosis. Proximal Outflow: Bilateral common femoral and visualized portions of the superficial and profunda femoral arteries are patent without evidence of aneurysm, dissection, vasculitis or significant stenosis. Veins: No obvious venous abnormality within the limitations of this arterial phase study. Review of the MIP images confirms the above findings. NON-VASCULAR Hepatobiliary: No focal liver abnormality. The gallbladder is not definitely visualized  and may be surgically absent. No biliary dilatation. Pancreas: No focal lesion. Normal pancreatic contour. No surrounding inflammatory changes. No main pancreatic ductal dilatation. Spleen: Normal in size without focal abnormality. Adrenals/Urinary Tract: No adrenal nodule bilaterally. Bilateral kidneys enhance symmetrically. Subcentimeter hypodensities are too small to characterize. No hydronephrosis. No hydroureter. The urinary bladder is unremarkable. On delayed imaging, there is no urothelial wall thickening and there are no filling defects in the opacified portions of the bilateral collecting systems or ureters. Stomach/Bowel: Stomach is within normal limits. No evidence of bowel wall thickening or dilatation. Scattered colonic diverticulosis. The appendix is not definitely identified. Lymphatic: No lymphadenopathy. Reproductive: Stranding within the region of the hysterectomy bed on the right (611:87, 608:116) of unclear etiology likely postsurgical in etiology. Status post  hysterectomy. No adnexal masses. Other: No intraperitoneal free fluid. No intraperitoneal free gas. No organized fluid collection. Musculoskeletal: Tiny fat containing umbilical hernia. No acute or significant osseous findings. IMPRESSION: VASCULAR 1. No acute aortic abnormality. Aortic Atherosclerosis (ICD10-I70.0) - mild. 2. No pulmonary embolus. NON-VASCULAR 1. Stranding within the region of the hysterectomy bed on the right of unclear etiology. Findings may be postsurgical or represent vasculature. Attention on follow-up. 2. Otherwise no acute intrathoracic, intra-abdominal, intrapelvic abnormality. Electronically Signed   By: Iven Finn M.D.   On: 10/22/2020 23:28    Procedures Procedures   Medications Ordered in ED Medications  oxyCODONE-acetaminophen (PERCOCET/ROXICET) 5-325 MG per tablet 1 tablet (1 tablet Oral Given 10/22/20 1826)  iohexol (OMNIPAQUE) 350 MG/ML injection 100 mL (100 mLs Intravenous Contrast Given 10/22/20 2125)  cyclobenzaprine (FLEXERIL) tablet 10 mg (10 mg Oral Given 10/23/20 0008)  naproxen (NAPROSYN) tablet 500 mg (500 mg Oral Given 10/23/20 0008)    ED Course  I have reviewed the triage vital signs and the nursing notes.  Pertinent labs & imaging results that were available during my care of the patient were reviewed by me and considered in my medical decision making (see chart for details).    MDM Rules/Calculators/A&P                          Initial workup for possible kidney stone negative.  Patient concerned that pain is still persistent.  Patient mentioned prior concern for aortic pathology.  CT angio of abd/pelvis ordered.  I was alerted by CT that they inadvertently scanned patients chest.  I was told by radiology that they would not read the CT angio abdomen/pelvis unless an order for CT chest was also included, given the technicians included it in patient's scan.    Pending final results, patient signed out to on-coming physician.   Final Clinical  Impression(s) / ED Diagnoses Final diagnoses:  Flank pain    Rx / DC Orders ED Discharge Orders         Ordered    cyclobenzaprine (FLEXERIL) 10 MG tablet  2 times daily PRN        10/23/20 0005    lidocaine (LIDODERM) 5 %  Every 24 hours        10/23/20 0005    naproxen (NAPROSYN) 500 MG tablet  2 times daily        10/23/20 0005    cyclobenzaprine (FLEXERIL) 10 MG tablet  2 times daily PRN,   Status:  Discontinued        10/22/20 2354    naproxen (NAPROSYN) 500 MG tablet  2 times daily,   Status:  Discontinued  10/22/20 2354    lidocaine (LIDODERM) 5 %  Every 24 hours,   Status:  Discontinued        10/22/20 2354           Luna Fuse, MD 10/23/20 1320

## 2020-10-25 ENCOUNTER — Encounter: Payer: Self-pay | Admitting: Family Medicine

## 2020-10-25 ENCOUNTER — Ambulatory Visit (INDEPENDENT_AMBULATORY_CARE_PROVIDER_SITE_OTHER): Payer: BC Managed Care – PPO | Admitting: Family Medicine

## 2020-10-25 ENCOUNTER — Other Ambulatory Visit: Payer: Self-pay

## 2020-10-25 VITALS — BP 130/80 | HR 81 | Temp 97.7°F | Wt 144.5 lb

## 2020-10-25 DIAGNOSIS — B029 Zoster without complications: Secondary | ICD-10-CM | POA: Diagnosis not present

## 2020-10-25 DIAGNOSIS — I700 Atherosclerosis of aorta: Secondary | ICD-10-CM | POA: Diagnosis not present

## 2020-10-25 DIAGNOSIS — Z90710 Acquired absence of both cervix and uterus: Secondary | ICD-10-CM | POA: Diagnosis not present

## 2020-10-25 DIAGNOSIS — R109 Unspecified abdominal pain: Secondary | ICD-10-CM | POA: Diagnosis not present

## 2020-10-25 MED ORDER — VALACYCLOVIR HCL 1 G PO TABS: 1000.0000 mg | ORAL_TABLET | Freq: Three times a day (TID) | ORAL | 0 refills | Status: AC

## 2020-10-25 NOTE — Progress Notes (Signed)
Subjective:     Molly Peterson is a 55 y.o. female presenting for ER follow-up and Back Pain     HPI   #Back pain - started last Monday - was fine and then laid down on the couch and woke up with severe pain  - like someone stuck something in the side and was turning it - tried to walk it off w/o improvement - heat made it worse - took motrin no improvement - no position would help - no pain for 2 days and then recurred  - worse on the right side - Friday night was severe - Saturday am was fine - went to sons graduation and was fine - severe later that night - thought this may be a kidney stone but work-up didn't show this - Sunday am noticed the rash/welts on the back - took a photo and now a few more - these are getting worse - pain is still in the same area and itchy -    Review of Systems  10/22/2020: ER - left flank pain - due for injection for back on 5/29. CT without stone or dissection. Given flexeril, lidcaine, naproxen  Social History   Tobacco Use  Smoking Status Former Smoker  . Packs/day: 0.25  . Years: 16.00  . Pack years: 4.00  . Types: Cigarettes  . Quit date: 06/18/2004  . Years since quitting: 16.3  Smokeless Tobacco Never Used        Objective:    BP Readings from Last 3 Encounters:  10/25/20 130/80  10/22/20 (!) 132/94  10/11/20 102/74   Wt Readings from Last 3 Encounters:  10/25/20 144 lb 8 oz (65.5 kg)  09/05/20 144 lb 9.6 oz (65.6 kg)  06/29/20 142 lb 12 oz (64.8 kg)    BP 130/80   Pulse 81   Temp 97.7 F (36.5 C) (Temporal)   Wt 144 lb 8 oz (65.5 kg)   SpO2 100%   BMI 25.60 kg/m    Physical Exam Constitutional:      General: She is not in acute distress.    Appearance: She is well-developed. She is not diaphoretic.  HENT:     Right Ear: External ear normal.     Left Ear: External ear normal.  Eyes:     Conjunctiva/sclera: Conjunctivae normal.  Cardiovascular:     Rate and Rhythm: Normal rate.  Pulmonary:      Effort: Pulmonary effort is normal.  Musculoskeletal:     Cervical back: Neck supple.  Skin:    General: Skin is warm and dry.     Capillary Refill: Capillary refill takes less than 2 seconds.     Comments: Raised erythematous lesions on the left flank and left lower abdomen. Possible pustule on one lesion  Neurological:     Mental Status: She is alert. Mental status is at baseline.  Psychiatric:        Mood and Affect: Mood normal.        Behavior: Behavior normal.           Assessment & Plan:   Problem List Items Addressed This Visit      Cardiovascular and Mediastinum   Aortic atherosclerosis ([place]) - Primary    Reviewed findings on recent ER CT imaging. Already on crestor 5 mg. Discussed starting asa 81 mg for prevention        Other   Flank pain    Colicky flank pain w/o clear etiology now with erythematous rash  in same area and following a dermatomes w/o clear vesicles. Given pain followed by rash and within in the 2 day window discussed starting treatment for possible shingles. Valacyclovir 1g TID x 7 days ordered. If no improvement or persisting suspecting MSK and would advise PT if continued symptoms. Use flexeril prn as prescribed by ER      S/P hysterectomy    Reviewed Ct results with stranding of unclear etiology near site of hysterectomy. Pt follows with Dr. Corinna Capra ob/gyn. Discussed that not likely causing her pain would recommend calling her ob/gyn to review imaging and see if any additional work-up is needed. No abnormalities noted on prior recent CTs of the area.        Other Visit Diagnoses    Herpes zoster without complication       Relevant Medications   valACYclovir (VALTREX) 1000 MG tablet       Return if symptoms worsen or fail to improve.  [name], MD  This visit occurred during the SARS-CoV-2 public health emergency.  Safety protocols were in place, including screening questions prior to the visit, additional usage of staff PPE, and  extensive cleaning of exam room while observing appropriate contact time as indicated for disinfecting solutions.

## 2020-10-25 NOTE — Assessment & Plan Note (Signed)
Colicky flank pain w/o clear etiology now with erythematous rash in same area and following a dermatomes w/o clear vesicles. Given pain followed by rash and within in the 2 day window discussed starting treatment for possible shingles. Valacyclovir 1g TID x 7 days ordered. If no improvement or persisting suspecting MSK and would advise PT if continued symptoms. Use flexeril prn as prescribed by ER

## 2020-10-25 NOTE — Patient Instructions (Signed)
#  Aortic atherosclerosis - Continue Crestor - Start Aspirin 81 mg daily  #Back/side pain - Might be muscle - might be shingles -- start Valacyclovir 1000 mg three times daily - Can try hydrocortisone cream or benadryl cream for itching   #Call Dr. Corinna Capra and ask him to review imaging at the hysterectomy site for recommendations

## 2020-10-25 NOTE — Assessment & Plan Note (Signed)
Reviewed findings on recent ER CT imaging. Already on crestor 5 mg. Discussed starting asa 81 mg for prevention

## 2020-10-25 NOTE — Assessment & Plan Note (Signed)
Reviewed Ct results with stranding of unclear etiology near site of hysterectomy. Pt follows with Dr. Corinna Capra ob/gyn. Discussed that not likely causing her pain would recommend calling her ob/gyn to review imaging and see if any additional work-up is needed. No abnormalities noted on prior recent CTs of the area.

## 2020-10-26 ENCOUNTER — Encounter: Payer: Self-pay | Admitting: Family Medicine

## 2020-10-26 DIAGNOSIS — B029 Zoster without complications: Secondary | ICD-10-CM

## 2020-10-27 MED ORDER — GABAPENTIN 300 MG PO CAPS: ORAL_CAPSULE | ORAL | 0 refills | Status: DC

## 2020-10-27 NOTE — Addendum Note (Signed)
Addended by: Waunita Schooner R on: 10/27/2020 02:08 PM   Modules accepted: Orders

## 2020-10-27 NOTE — Telephone Encounter (Signed)
Called pt. She notes new LLQ abdominal pain. And additional lesions all on one side.   Rash still seems consistent with shingles  Abdominal pain - unclear if related. CT with diverticulosis so discussed this could be diverticulitis  Decided to send in Gabapentin to try for pain. Pt will consider taking.   Discussed without an exam I'm not sure all her pain is related to shingles but reasonable to try gabapentin

## 2020-10-28 DIAGNOSIS — R1032 Left lower quadrant pain: Secondary | ICD-10-CM | POA: Diagnosis not present

## 2020-10-28 DIAGNOSIS — R1012 Left upper quadrant pain: Secondary | ICD-10-CM | POA: Diagnosis not present

## 2020-10-28 DIAGNOSIS — K5790 Diverticulosis of intestine, part unspecified, without perforation or abscess without bleeding: Secondary | ICD-10-CM | POA: Diagnosis not present

## 2020-10-28 DIAGNOSIS — B029 Zoster without complications: Secondary | ICD-10-CM | POA: Diagnosis not present

## 2020-10-28 DIAGNOSIS — M545 Low back pain, unspecified: Secondary | ICD-10-CM | POA: Diagnosis not present

## 2020-11-08 ENCOUNTER — Telehealth: Payer: Self-pay | Admitting: Internal Medicine

## 2020-11-08 NOTE — Telephone Encounter (Signed)
Reviewed stress echocardiogram instructions with patient as follows.  - you may eat a light breakfast/ lunch prior to your procedure - no caffeine for 24 hours prior to your test (coffee, tea, soft drinks, or chocolate)  - no smoking/ vaping for 4 hours prior to your test - you may take your regular medications the day of your test except for: DO NOT take metoprolol for 24 hours prior - bring any inhalers with you to your test - wear comfortable clothing & tennis/ non-skid shoes to walk on the treadmill  She reviewed her series of events prior to this and some of her frustrations. Apologized for her experiences and that we will see her tomorrow. She verbalized understanding, confirmed her appointment, and had no further questions at this time.

## 2020-11-09 ENCOUNTER — Other Ambulatory Visit: Payer: Self-pay

## 2020-11-09 ENCOUNTER — Ambulatory Visit: Payer: BC Managed Care – PPO

## 2020-11-10 ENCOUNTER — Telehealth: Payer: Self-pay

## 2020-11-10 NOTE — Telephone Encounter (Signed)
Was able to reach out to pt regarding her upcoming treadmill test tomorrow at 4 pm, advised to arrive 15 mins early before her arrival time.   Reminded pt of her instructions: - you may eat a light breakfast/ lunch prior to your procedure - no caffeine for 24 hours prior to your test (coffee, tea, soft drinks, or chocolate)  - no smoking/ vaping for 4 hours prior to your test - you may take your regular medications the day of your test - wear comfortable clothing & tennis/ non-skid shoes to walk on the treadmill  Based on Dr. Harrington Challenger notes in EMR from 09/05/2020  "2  Chest discomfort   Discomfort not with physical activity  Occurs when BP high   With stress   Will follow with [facility]    Do stress test on mds" Advised pt to take her metoprolol, do not withhold (this was agreeable with Nira Conn, RN when reviewing chart). Molly Peterson verbalized understanding,  Molly Peterson advised that we give her enough time to call and cancel if anything was to pop up again, she has wasted time away from work for 3 reschedule since Feb for this test. If tomorrow has to be reschedule, she will find another practice. 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 Otherwise will see pt tomorrow afternoon

## 2020-11-11 ENCOUNTER — Other Ambulatory Visit: Payer: Self-pay

## 2020-11-11 ENCOUNTER — Ambulatory Visit (INDEPENDENT_AMBULATORY_CARE_PROVIDER_SITE_OTHER): Payer: BC Managed Care – PPO

## 2020-11-11 ENCOUNTER — Ambulatory Visit: Payer: BC Managed Care – PPO

## 2020-11-11 DIAGNOSIS — R079 Chest pain, unspecified: Secondary | ICD-10-CM

## 2020-11-11 DIAGNOSIS — I10 Essential (primary) hypertension: Secondary | ICD-10-CM

## 2020-11-18 ENCOUNTER — Telehealth: Payer: Self-pay | Admitting: Internal Medicine

## 2020-11-18 NOTE — Telephone Encounter (Signed)
Patient calling in very upset that she has not receiving a call regarding her results or had them sent to Dr. Harrington Challenger. Patients test was on 5/27  Please advise

## 2020-11-18 NOTE — Telephone Encounter (Signed)
PT REACHING OUT FOR ECHO RESULTS FROM 5/27, PLEASE FOLLOW UP W/ PT

## 2020-11-21 ENCOUNTER — Telehealth: Payer: Self-pay | Admitting: Internal Medicine

## 2020-11-21 DIAGNOSIS — I700 Atherosclerosis of aorta: Secondary | ICD-10-CM

## 2020-11-21 DIAGNOSIS — R1032 Left lower quadrant pain: Secondary | ICD-10-CM | POA: Diagnosis not present

## 2020-11-21 NOTE — Addendum Note (Signed)
Addended by: Rodman Key on: 11/21/2020 05:11 PM   Modules accepted: Orders

## 2020-11-21 NOTE — Telephone Encounter (Signed)
This was reviewed by Dr. Harrington Challenger, the patient has been contacted by the physician.

## 2020-11-21 NOTE — Addendum Note (Signed)
Addended by: Rollen Sox on: 11/21/2020 05:16 PM   Modules accepted: Orders

## 2020-11-21 NOTE — Telephone Encounter (Signed)
Spoke with patient   [facility]  Orders placed.

## 2020-11-21 NOTE — Telephone Encounter (Signed)
When I spoke with pt re stress test we discussed lipids    She will need to have a fasting Lipomed panel with LDL done now that she is on medication She would like to have this done through our [place] office since she lives closer to there than [place]  Please arrange day for her to have it  [facility] as well

## 2020-11-23 DIAGNOSIS — M461 Sacroiliitis, not elsewhere classified: Secondary | ICD-10-CM | POA: Diagnosis not present

## 2021-01-10 ENCOUNTER — Telehealth: Payer: Self-pay | Admitting: Family Medicine

## 2021-01-10 NOTE — Telephone Encounter (Signed)
That would be OK.   Can schedule in September or October

## 2021-01-10 NOTE — Telephone Encounter (Signed)
Mrs. Mayernik called in wanted to know if Dr. Einar Pheasant would take her son on as a patient. He is having high BP  issues.

## 2021-01-11 DIAGNOSIS — L570 Actinic keratosis: Secondary | ICD-10-CM | POA: Diagnosis not present

## 2021-01-11 DIAGNOSIS — D1801 Hemangioma of skin and subcutaneous tissue: Secondary | ICD-10-CM | POA: Diagnosis not present

## 2021-01-11 DIAGNOSIS — Z808 Family history of malignant neoplasm of other organs or systems: Secondary | ICD-10-CM | POA: Diagnosis not present

## 2021-01-11 DIAGNOSIS — L578 Other skin changes due to chronic exposure to nonionizing radiation: Secondary | ICD-10-CM | POA: Diagnosis not present

## 2021-01-13 NOTE — Telephone Encounter (Signed)
Left voice message to call the office  

## 2021-01-13 NOTE — Telephone Encounter (Signed)
Mrs. Molly Peterson called and stated that they are not going to move forward her son was seen at [facility] clinic and as a PCP

## 2021-02-17 ENCOUNTER — Ambulatory Visit: Payer: BC Managed Care – PPO | Admitting: Nurse Practitioner

## 2021-02-17 ENCOUNTER — Other Ambulatory Visit: Payer: Self-pay

## 2021-02-17 ENCOUNTER — Encounter: Payer: Self-pay | Admitting: Nurse Practitioner

## 2021-02-17 VITALS — BP 130/86 | HR 88 | Temp 98.0°F | Resp 16

## 2021-02-17 DIAGNOSIS — J3089 Other allergic rhinitis: Secondary | ICD-10-CM

## 2021-02-17 LAB — POC COVID19 BINAXNOW: SARS Coronavirus 2 Ag: NEGATIVE

## 2021-02-17 NOTE — Progress Notes (Signed)
   Subjective:    Patient ID: [name], female    DOB: 1965-11-13, 55 y.o.   MRN: [id number]  HPI  55 year old female presenting to [facility] with concern for COVID/URI today.  Symptom onset 04/17/21 congestion, ears "itching" cough and PND. She has been exposed to large amounts of cut dry grass that are being stored next to her building. She has a known allergy to grass.   Denies fever/body aches  Vaccinated with one booster for COVID  Takes daily allergy medication OTC and flonase    Today's Vitals   02/17/21 1321  BP: 130/86  Pulse: 88  Resp: 16  Temp: 98 F (36.7 C)  TempSrc: Tympanic  SpO2: 98%   There is no height or weight on file to calculate BMI.    Review of Systems  Constitutional:  Positive for fatigue.  HENT:  Positive for congestion and postnasal drip.   Eyes: Negative.   Respiratory:  Positive for cough.   Cardiovascular: Negative.   Gastrointestinal: Negative.   Genitourinary: Negative.   Musculoskeletal: Negative.   Neurological: Negative.   Hematological: Negative.   Psychiatric/Behavioral: Negative.     Current Outpatient Medications  Medication Instructions   ALPRAZolam (XANAX) 0.25 mg, Oral, As needed   ketotifen (ZADITOR) 0.025 % ophthalmic solution 1 drop, Both Eyes, Daily   Lactobacillus (PROBIOTIC ACIDOPHILUS PO) 1 capsule, Oral, Daily   Loratadine 10 mg, Oral, Daily   metoprolol succinate (TOPROL-XL) 50 MG 24 hr tablet TAKE 1 TABLET BY MOUTH DAILY WITH OR IMMEDIATLEY FOLLOWING A MEAL   rosuvastatin (CRESTOR) 5 mg, Oral, Daily   spironolactone (ALDACTONE) 25 MG tablet TAKE 0.5 TABLET EVERY DAY       Objective:    PHYSICAL EXAM HEENT  Ears: TM dull bilaterally no erythema or inflammation noted Nose: turbinates pale  Mouth: PND noted without erythema or inflammation   Cardiac: RRR Lungs: Clear throughout effort normal   Recent Results (from the past 2160 hour(s))  POC COVID-19     Status: Normal    Collection Time: 02/17/21  1:34 PM  Result Value Ref Range   SARS Coronavirus 2 Ag Negative Negative        Assessment & Plan:   1. Allergic rhinitis due to other allergic trigger, unspecified seasonality  Continue OTC allergy regimen, may add decongestant for additional relief.  Discussed avoidance of allergens/shower at night etc  RTC if symptoms persist or with new concerns.  - Novel Coronavirus, [facility] ([facility]) will f/u with results when available - if positive may RTW 02/20/21 with mask  - POC COVID-19- negative

## 2021-02-18 LAB — SARS-COV-2, NAA 2 DAY TAT

## 2021-02-18 LAB — NOVEL CORONAVIRUS, NAA: SARS-CoV-2, NAA: NOT DETECTED

## 2021-02-20 ENCOUNTER — Encounter: Payer: Self-pay | Admitting: Nurse Practitioner

## 2021-03-10 DIAGNOSIS — Z23 Encounter for immunization: Secondary | ICD-10-CM | POA: Diagnosis not present

## 2021-03-23 ENCOUNTER — Telehealth: Payer: Self-pay | Admitting: Family Medicine

## 2021-03-23 DIAGNOSIS — F419 Anxiety disorder, unspecified: Secondary | ICD-10-CM

## 2021-03-23 DIAGNOSIS — Z20822 Contact with and (suspected) exposure to covid-19: Secondary | ICD-10-CM | POA: Diagnosis not present

## 2021-03-23 MED ORDER — ALPRAZOLAM 0.25 MG PO TABS: 0.2500 mg | ORAL_TABLET | Freq: Every day | ORAL | 0 refills | Status: DC | PRN

## 2021-03-23 NOTE — Telephone Encounter (Signed)
Refill provided. Will need appointment prior to next refill to check in on anxiety

## 2021-03-23 NOTE — Addendum Note (Signed)
Addended by: Waunita Schooner R on: 03/23/2021 12:12 PM   Modules accepted: Orders

## 2021-03-23 NOTE — Telephone Encounter (Signed)
  Encourage patient to contact the pharmacy for refills or they can request refills through [facility]:  Please schedule appointment if longer than 1 year  NEXT APPOINTMENT DATE:NO FUTURE APPT  MEDICATION:ALPRAZolam (XANAX) 0.25 MG tablet Is the patient out of medication? [place], [place]  Let patient know to contact pharmacy at the end of the day to make sure medication is ready.  Please notify patient to allow 48-72 hours to process  CLINICAL FILLS OUT ALL BELOW:   LAST REFILL:  QTY:  REFILL DATE:    OTHER COMMENTS:    Okay for refill?  Please advise

## 2021-05-22 ENCOUNTER — Encounter: Payer: Self-pay | Admitting: Gastroenterology

## 2021-06-06 ENCOUNTER — Ambulatory Visit: Payer: BC Managed Care – PPO | Admitting: Family

## 2021-06-07 ENCOUNTER — Ambulatory Visit (AMBULATORY_SURGERY_CENTER): Payer: BC Managed Care – PPO

## 2021-06-07 ENCOUNTER — Other Ambulatory Visit: Payer: Self-pay

## 2021-06-07 VITALS — Ht 63.0 in | Wt 145.0 lb

## 2021-06-07 DIAGNOSIS — Z86010 Personal history of colonic polyps: Secondary | ICD-10-CM

## 2021-06-07 MED ORDER — NA SULFATE-K SULFATE-MG SULF 17.5-3.13-1.6 GM/177ML PO SOLN: 1.0000 | Freq: Once | ORAL | 0 refills | Status: AC

## 2021-06-07 NOTE — Progress Notes (Signed)
Pre visit completed via phone call; Patient verified name, DOB, and address; No egg or soy allergy known to patient  No issues known to pt with past sedation with any surgeries or procedures Patient denies ever being told they had issues or difficulty with intubation  No FH of Malignant Hyperthermia Pt is not on diet pills Pt is not on home 02  Pt is not on blood thinners  Pt denies issues with constipation  No A fib or A flutter Pt is fully vaccinated for Covid x 2 + booster; NO PA's for preps discussed with pt in [place] today  Discussed with pt there will be an out-of-pocket cost for prep and that varies from $0 to 70 +  dollars - pt verbalized understanding  Due to the COVID-19 pandemic we are asking patients to follow certain guidelines in [place] and the [facility]   Pt aware of COVID protocols and [facility] guidelines   

## 2021-06-14 ENCOUNTER — Other Ambulatory Visit: Payer: Self-pay

## 2021-06-14 ENCOUNTER — Other Ambulatory Visit: Payer: BC Managed Care – PPO

## 2021-06-14 ENCOUNTER — Telehealth (INDEPENDENT_AMBULATORY_CARE_PROVIDER_SITE_OTHER): Payer: BC Managed Care – PPO | Admitting: Family

## 2021-06-14 ENCOUNTER — Encounter: Payer: Self-pay | Admitting: Family

## 2021-06-14 VITALS — Ht 63.0 in | Wt 145.0 lb

## 2021-06-14 DIAGNOSIS — M2550 Pain in unspecified joint: Secondary | ICD-10-CM

## 2021-06-14 DIAGNOSIS — D519 Vitamin B12 deficiency anemia, unspecified: Secondary | ICD-10-CM

## 2021-06-14 DIAGNOSIS — D72829 Elevated white blood cell count, unspecified: Secondary | ICD-10-CM | POA: Diagnosis not present

## 2021-06-14 DIAGNOSIS — R17 Unspecified jaundice: Secondary | ICD-10-CM | POA: Diagnosis not present

## 2021-06-14 DIAGNOSIS — E785 Hyperlipidemia, unspecified: Secondary | ICD-10-CM | POA: Diagnosis not present

## 2021-06-14 DIAGNOSIS — R635 Abnormal weight gain: Secondary | ICD-10-CM

## 2021-06-14 DIAGNOSIS — R21 Rash and other nonspecific skin eruption: Secondary | ICD-10-CM | POA: Insufficient documentation

## 2021-06-14 MED ORDER — TRIAMCINOLONE ACETONIDE 0.1 % EX CREA: 1.0000 "application " | TOPICAL_CREAM | Freq: Two times a day (BID) | CUTANEOUS | 0 refills | Status: AC

## 2021-06-14 NOTE — Assessment & Plan Note (Signed)
Prescription for triamcinolone cream sent to pharmacy.  Continue with lotion daily as may be due to dry skin.

## 2021-06-14 NOTE — Assessment & Plan Note (Signed)
Lab work ordered today pending results.  Patient to continue with daily B12 supplementation

## 2021-06-14 NOTE — Assessment & Plan Note (Signed)
Lab ordered for abnormal weight gain pending results.  Referral to weight management as well in place advised patient if she does not hear back in a week about the referral to please let us know.  Dietary and exercise regimen discussed with patient as well recommended my fitness pal for calorie counting as well

## 2021-06-14 NOTE — Assessment & Plan Note (Signed)
Patient was fasting today so ordered lab work pending results.  Patient to continue working on low-cholesterol diet and exercise as tolerated

## 2021-06-14 NOTE — Assessment & Plan Note (Addendum)
Inflammatory and autoimmune work-up in place pending results.  Fibromyalgia not to be excluded as a differential diagnosis.

## 2021-06-14 NOTE — Patient Instructions (Addendum)
A referral was placed today for weight management.  Please let us know if you have not heard back within 1 week about your referral.  Stop by the lab prior to leaving today. I will notify you of your results once received.   Please do not hesitate to reach out with any questions and or concerns.  Regards,   Eugenia Pancoast

## 2021-06-14 NOTE — Progress Notes (Signed)
MyChart Video Visit    Virtual Visit via Video Note   This visit type was conducted due to national recommendations for restrictions regarding the COVID-19 Pandemic (e.g. social distancing) in an effort to limit this patient's exposure and mitigate transmission in our community. This patient is at least at moderate risk for complications without adequate follow up. This format is felt to be most appropriate for this patient at this time. Physical exam was limited by quality of the video and audio technology used for the visit. CMA was able to get the patient set up on a video visit.  Patient location: Home. Patient and provider in visit Provider location: Office  I discussed the limitations of evaluation and management by telemedicine and the availability of in person appointments. The patient expressed understanding and agreed to proceed.  Visit Date: 06/14/2021  Today's healthcare provider: Eugenia Pancoast, FNP     Subjective:    Patient ID: Molly Peterson, female    DOB: 1965-08-06, 55 y.o.   MRN: [id number]  Chief Complaint  Patient presents with   unusual weight gain     HPI  55 y/o female with concerns.   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 She likes being busy, but its a lot. Her 26 y/o son also with narcolepsy which is affecting his quality of life which is also stressful for her. Daily arthralgias, hurts all over.    She walks 4-5 miles daily.  Doesn't eat deli meats and or processed meats. Occasional cookies and crackers.  Typical meals: Breakfast: yogurt, small handful granola, blueberries, raspberries, banana. And/or avocado toast with egg/ and or oatmeal with fruit Lunch: peanut butter jelly sandwich with fruit or vegetables.  Snacks: hummus with veggies Dinner: pork chops/chicken/ rare beef or steak, beans and salad  Eating habits have not  changed, and she still exercises, but 10-12 pounds heavier, and since October unable to fit into her normal clothing without feeling tight.  Increased anxiety: excess stress at home and at work.   Polyarthralgia: feet and hands with swelling, waking up with swelling and stiffness. Hands go to sleep constantly at night, has been going on 'for years'. Difficult to even take her bra off, decreased flexibility. No fine movement issue, but stretching to reach far areas cause her discomfort. Bil feet stiff and tingling/numb in the am, >1 hour to get rid of stiffness. No known autoimmune diseases in the family.   Very dry eyes, dry mouth everything feels dry' uses otc eye drops and claritin daily.   Does have really dry skin and notices a prickly rash on her skin, on her right forearm and a bit on her chest, scratching it makes it worse. She is using hydrocortisone which is also helping.  Past Medical History:  Diagnosis Date   Acute pharyngitis    ANXIETY 09/18/2006   on PRN meds for situational anxiety   Chronic kidney disease    hx kidney stones   DEPRESSION 09/18/2006   DERMATITIS, SEBORRHEIC NEC 01/06/2007   Edema    symptom   Flatulence, eructation, and gas pain    Hyperlipidemia    on meds   HYPERTENSION 09/18/2006   on meds   Osteopenia    Palpitations 09/18/2006   REACTION, ACUTE STRESS W/EMOTIONAL DSTURB 12/23/2006   Seasonal allergies    Unspecified constipation    Urinary tract infection,  site not specified     Past Surgical History:  Procedure Laterality Date   ABDOMINAL HYSTERECTOMY     partial-still has ovaries   APPENDECTOMY  unknown   CESAREAN SECTION     x 2   CHOLECYSTECTOMY     COLONOSCOPY  2017   MS-MAC-goor prep-int/ext hem/SSP/adenoma-5 yr recall   CYSTOSCOPY  2020   POLYPECTOMY  2017   SSP/adenoma   TONSILLECTOMY  unknown   WISDOM TOOTH EXTRACTION      Family History  Problem Relation Age of Onset   Colon polyps Mother    Heart disease Mother         PVC _0  8   Atrial fibrillation Mother 45   Colon polyps Father    Heart disease Father        MI _1  13 , CABG _2    Hypertension Father    Hyperlipidemia Father    Hearing loss Father    Heart attack Father 49   Asthma Father    Kidney cancer Father    Dementia Maternal Grandmother    Heart failure Maternal Grandfather    Heart attack Maternal Grandfather    Stroke Paternal Grandmother 96   Heart attack Paternal Grandfather        young   Depression Son    Colon cancer Neg Hx    Esophageal cancer Neg Hx    Stomach cancer Neg Hx    Pancreatic cancer Neg Hx    Rectal cancer Neg Hx     Social History   Socioeconomic History   Marital status: Married    Spouse name: Iona Beard   Number of children: 2   Years of education: [employer]   Highest education level: Not on file  Occupational History    Employer: UNEMPLOYED  Tobacco Use   Smoking status: Former    Packs/day: 0.25    Years: 16.00    Pack years: 4.00    Types: Cigarettes    Quit date: 06/18/2004    Years since quitting: 17.0   Smokeless tobacco: Never  Vaping Use   Vaping Use: Never used  Substance and Sexual Activity   Alcohol use: Not Currently    Comment: occasionally-no specific pattern, rare   Drug use: No   Sexual activity: Yes    Birth control/protection: Surgical  Other Topics Concern   Not on file  Social History Narrative   Lives with [place], Husband   2 Children - Sales executive (lives at home) and Merrilee Seashore   Enjoy: not sure anymore, attending Nick's sports events in college   Work: Teacher, early years/pre at [employer] Strain: Not on [employer] Insecurity: Not on file  Transportation Needs: Not on file  Physical Activity: Not on file  Stress: Not on file  Social Connections: Not on file  Intimate Partner Violence: Not on file    Outpatient Medications Prior to Visit  Medication Sig Dispense Refill   ALPRAZolam (XANAX) 0.25 MG tablet  Take 1 tablet (0.25 mg total) by mouth daily as needed for anxiety. (Patient taking differently: Take 0.25 mg by mouth at bedtime as needed for sleep.) 15 tablet 0   ketotifen (ZADITOR) 0.025 % ophthalmic solution Place 1 drop into both eyes daily.     Loratadine 10 MG CAPS Take 10 mg by mouth daily.     metoprolol succinate (TOPROL-XL) 50 MG 24 hr tablet TAKE 1 TABLET BY MOUTH DAILY WITH OR IMMEDIATLEY  FOLLOWING A MEAL (Patient taking differently: Take by mouth daily.) 90 tablet 3   OSPHENA 60 MG TABS Take 1 tablet by mouth daily.     risedronate (ACTONEL) 150 MG tablet Take 150 mg by mouth every 30 (thirty) days.     spironolactone (ALDACTONE) 25 MG tablet TAKE 0.5 TABLET EVERY DAY (Patient taking differently: Take 12.5 mg by mouth daily.) 45 tablet 2   rosuvastatin (CRESTOR) 5 MG tablet Take 1 tablet (5 mg total) by mouth daily. 90 tablet 3   No facility-administered medications prior to visit.    Allergies  Allergen Reactions   Latex Itching and Swelling   Sulfa Antibiotics     Dizziness    Review of Systems  Constitutional:  Negative for chills, fever, malaise/fatigue and weight loss (weight gain).  HENT:  Negative for hearing loss.        Xerostomia  Eyes:  Negative for blurred vision.       Dryness of eyes   Musculoskeletal:  Positive for joint pain (feet and hands with swelling in the am, stiffness in am and throughout day in multiple joints).  Skin:  Positive for rash (right anterior forearm and chest, small pinpoint red papules, itch at times).  Psychiatric/Behavioral:  The patient is nervous/anxious (increased stress).       Objective:    Physical Exam Constitutional:      General: She is not in acute distress.    Appearance: Normal appearance. She is obese. She is not ill-appearing, toxic-appearing or diaphoretic.  HENT:     Head: Normocephalic.  Pulmonary:     Effort: Pulmonary effort is normal.  Skin:    General: Skin is dry.     Findings: Rash (mild eyrthema  anterior right forearm, with small red papule at wrist anterior) present.  Neurological:     General: No focal deficit present.     Mental Status: She is alert and oriented to person, place, and time.  Psychiatric:        Mood and Affect: Mood normal.        Behavior: Behavior normal.        Thought Content: Thought content normal.        Judgment: Judgment normal.    Ht _0  (1.6 m)    Wt 145 lb (65.8 kg)    BMI 25.69 kg/m  Wt Readings from Last 3 Encounters:  06/14/21 145 lb (65.8 kg)  06/07/21 145 lb (65.8 kg)  10/25/20 144 lb 8 oz (65.5 kg)       Assessment & Plan:   Problem List Items Addressed This Visit       Musculoskeletal and Integument   Rash and nonspecific skin eruption    Prescription for triamcinolone cream sent to pharmacy.  Continue with lotion daily as may be due to dry skin.      Relevant Medications   triamcinolone cream (KENALOG) 0.1 %     Other   Hyperlipidemia    Patient was fasting today so ordered lab work pending results.  Patient to continue working on low-cholesterol diet and exercise as tolerated      Relevant Orders   Lipid Profile   Weight gain, abnormal    Lab ordered for abnormal weight gain pending results.  Referral to weight management as well in place advised patient if she does not hear back in a week about the referral to please let us know.  Dietary and exercise regimen discussed with patient as well recommended my  fitness pal for calorie counting as well      Relevant Orders   Amb Ref to Medical Weight Management   T3, free   T4, free   TSH   Leukocytosis    Patient was sick when this lab work was drawn back in May 2022 however we will repeat CBC today to verify resolution      Relevant Orders   CBC w/Diff   Elevated bilirubin - Primary    Very mildly elevated bilirubin back in May 2022 we will repeat liver function test today to verify resolution patient denies abdominal pain      Relevant Orders   Comprehensive  metabolic panel   Anemia due to vitamin B12 deficiency    Lab work ordered today pending results.  Patient to continue with daily B12 supplementation      Relevant Orders   B12 and Folate Panel   Polyarthralgia    Inflammatory and autoimmune work-up in place pending results.  Fibromyalgia not to be excluded as a differential diagnosis.      Relevant Orders   ANA   C-reactive protein   Rheumatoid factor   Sedimentation rate    I am having Romesha A. Tardiff start on triamcinolone cream. I am also having her maintain her Loratadine, metoprolol succinate, spironolactone, rosuvastatin, ketotifen, ALPRAZolam, Osphena, and risedronate.  Meds ordered this encounter  Medications   triamcinolone cream (KENALOG) 0.1 %    Sig: Apply 1 application topically 2 (two) times daily for 14 days.    Dispense:  28 g    Refill:  0    Order Specific Question:   Supervising Provider    Answer:   BEDSOLE, AMY E [[id number]]    I discussed the assessment and treatment plan with the patient. The patient was provided an opportunity to ask questions and all were answered. The patient agreed with the plan and demonstrated an understanding of the instructions.   The patient was advised to call back or seek an in-person evaluation if the symptoms worsen or if the condition fails to improve as anticipated.  I provided 55 minutes of face-to-face time during this encounter.   Eugenia Pancoast, [facility] [phone] (phone) [phone] (fax)  [place]

## 2021-06-14 NOTE — Assessment & Plan Note (Signed)
Patient was sick when this lab work was drawn back in May 2022 however we will repeat CBC today to verify resolution

## 2021-06-14 NOTE — Assessment & Plan Note (Signed)
Very mildly elevated bilirubin back in May 2022 we will repeat liver function test today to verify resolution patient denies abdominal pain

## 2021-06-15 LAB — CBC WITH DIFFERENTIAL/PLATELET
Basophils Absolute: 0.1 10*3/uL (ref 0.0–0.2)
Basos: 1 %
EOS (ABSOLUTE): 0.3 10*3/uL (ref 0.0–0.4)
Eos: 5 %
Hematocrit: 41.9 % (ref 34.0–46.6)
Hemoglobin: 13.9 g/dL (ref 11.1–15.9)
Immature Grans (Abs): 0 10*3/uL (ref 0.0–0.1)
Immature Granulocytes: 0 %
Lymphocytes Absolute: 2 10*3/uL (ref 0.7–3.1)
Lymphs: 38 %
MCH: 29.4 pg (ref 26.6–33.0)
MCHC: 33.2 g/dL (ref 31.5–35.7)
MCV: 89 fL (ref 79–97)
Monocytes Absolute: 0.4 10*3/uL (ref 0.1–0.9)
Monocytes: 7 %
Neutrophils Absolute: 2.6 10*3/uL (ref 1.4–7.0)
Neutrophils: 49 %
Platelets: 198 10*3/uL (ref 150–450)
RBC: 4.72 x10E6/uL (ref 3.77–5.28)
RDW: 12.1 % (ref 11.7–15.4)
WBC: 5.3 10*3/uL (ref 3.4–10.8)

## 2021-06-15 LAB — COMPREHENSIVE METABOLIC PANEL
ALT: 13 IU/L (ref 0–32)
AST: 14 IU/L (ref 0–40)
Albumin/Globulin Ratio: 2.1 (ref 1.2–2.2)
Albumin: 4.4 g/dL (ref 3.8–4.9)
Alkaline Phosphatase: 57 IU/L (ref 44–121)
BUN/Creatinine Ratio: 17 (ref 9–23)
BUN: 14 mg/dL (ref 6–24)
Bilirubin Total: 0.3 mg/dL (ref 0.0–1.2)
CO2: 25 mmol/L (ref 20–29)
Calcium: 9.3 mg/dL (ref 8.7–10.2)
Chloride: 103 mmol/L (ref 96–106)
Creatinine, Ser: 0.81 mg/dL (ref 0.57–1.00)
Globulin, Total: 2.1 g/dL (ref 1.5–4.5)
Glucose: 89 mg/dL (ref 70–99)
Potassium: 4.6 mmol/L (ref 3.5–5.2)
Sodium: 141 mmol/L (ref 134–144)
Total Protein: 6.5 g/dL (ref 6.0–8.5)
eGFR: 86 mL/min/{1.73_m2} (ref >59)

## 2021-06-15 LAB — ANA: Anti Nuclear Antibody (ANA): NEGATIVE

## 2021-06-15 LAB — LIPID PANEL
Chol/HDL Ratio: 3.8 ratio (ref 0.0–4.4)
Cholesterol, Total: 165 mg/dL (ref 100–199)
HDL: 44 mg/dL (ref >39)
LDL Chol Calc (NIH): 85 mg/dL (ref 0–99)
Triglycerides: 211 mg/dL — ABNORMAL HIGH (ref 0–149)
VLDL Cholesterol Cal: 36 mg/dL (ref 5–40)

## 2021-06-15 LAB — T4, FREE: Free T4: 1.21 ng/dL (ref 0.82–1.77)

## 2021-06-15 LAB — TSH: TSH: 1.39 u[IU]/mL (ref 0.450–4.500)

## 2021-06-15 LAB — B12 AND FOLATE PANEL
Folate: 16.1 ng/mL (ref >3.0)
Vitamin B-12: 307 pg/mL (ref 232–1245)

## 2021-06-15 LAB — T3, FREE: T3, Free: 3.3 pg/mL (ref 2.0–4.4)

## 2021-06-15 LAB — RHEUMATOID FACTOR: Rheumatoid fact SerPl-aCnc: <10 IU/mL (ref <14.0)

## 2021-06-15 LAB — SEDIMENTATION RATE: Sed Rate: 12 mm/hr (ref 0–40)

## 2021-06-15 LAB — C-REACTIVE PROTEIN: CRP: 1 mg/L (ref 0–10)

## 2021-06-16 ENCOUNTER — Other Ambulatory Visit: Payer: BC Managed Care – PPO

## 2021-06-26 ENCOUNTER — Encounter: Payer: Self-pay | Admitting: Family

## 2021-06-28 ENCOUNTER — Ambulatory Visit (AMBULATORY_SURGERY_CENTER): Payer: BC Managed Care – PPO | Admitting: Gastroenterology

## 2021-06-28 ENCOUNTER — Encounter: Payer: Self-pay | Admitting: Gastroenterology

## 2021-06-28 VITALS — BP 124/76 | HR 66 | Temp 98.4°F | Resp 11 | Ht 63.0 in | Wt 145.0 lb

## 2021-06-28 DIAGNOSIS — D122 Benign neoplasm of ascending colon: Secondary | ICD-10-CM

## 2021-06-28 DIAGNOSIS — Z86010 Personal history of colonic polyps: Secondary | ICD-10-CM | POA: Diagnosis not present

## 2021-06-28 DIAGNOSIS — Z1211 Encounter for screening for malignant neoplasm of colon: Secondary | ICD-10-CM | POA: Diagnosis not present

## 2021-06-28 MED ORDER — SODIUM CHLORIDE 0.9 % IV SOLN: 500.0000 mL | INTRAVENOUS | Status: DC

## 2021-06-28 NOTE — Progress Notes (Signed)
See 06/14/2021 H&P, no changes.

## 2021-06-28 NOTE — Progress Notes (Signed)
Called to room to assist during endoscopic procedure.  Patient ID and intended procedure confirmed with present staff. Received instructions for my participation in the procedure from the performing physician.  

## 2021-06-28 NOTE — Patient Instructions (Signed)
Handouts on polyps, diverticulosis, and hemorrhoids given to you today  Await pathology results from Dr. Fuller Plan   YOU HAD AN ENDOSCOPIC PROCEDURE TODAY AT THE [facility]:   Refer to the procedure report that was given to you for any specific questions about what was found during the examination.  If the procedure report does not answer your questions, please call your gastroenterologist to clarify.  If you requested that your care partner not be given the details of your procedure findings, then the procedure report has been included in a sealed envelope for you to review at your convenience later.  YOU SHOULD EXPECT: Some feelings of bloating in the abdomen. Passage of more gas than usual.  Walking can help get rid of the air that was put into your GI tract during the procedure and reduce the bloating. If you had a lower endoscopy (such as a colonoscopy or flexible sigmoidoscopy) you may notice spotting of blood in your stool or on the toilet paper. If you underwent a bowel prep for your procedure, you may not have a normal bowel movement for a few days.  Please Note:  You might notice some irritation and congestion in your nose or some drainage.  This is from the oxygen used during your procedure.  There is no need for concern and it should clear up in a day or so.  SYMPTOMS TO REPORT IMMEDIATELY:  Following lower endoscopy (colonoscopy or flexible sigmoidoscopy):  Excessive amounts of blood in the stool  Significant tenderness or worsening of abdominal pains  Swelling of the abdomen that is new, acute  Fever of 100F or higher  For urgent or emergent issues, a gastroenterologist can be reached at any hour by calling [phone]. Do not use MyChart messaging for urgent concerns.    DIET:  We do recommend a small meal at first, but then you may proceed to your regular diet.  Drink plenty of fluids but you should avoid alcoholic beverages for 24 hours.  ACTIVITY:  You should  plan to take it easy for the rest of today and you should NOT DRIVE or use heavy machinery until tomorrow (because of the sedation medicines used during the test).    FOLLOW UP: Our staff will call the number listed on your records 48-72 hours following your procedure to check on you and address any questions or concerns that you may have regarding the information given to you following your procedure. If we do not reach you, we will leave a message.  We will attempt to reach you two times.  During this call, we will ask if you have developed any symptoms of COVID 19. If you develop any symptoms (ie: fever, flu-like symptoms, shortness of breath, cough etc.) before then, please call [phone].  If you test positive for Covid 19 in the 2 weeks post procedure, please call and report this information to [place].    If any biopsies were taken you will be contacted by phone or by letter within the next 1-3 weeks.  Please call us at [phone] if you have not heard about the biopsies in 3 weeks.    SIGNATURES/CONFIDENTIALITY: You and/or your care partner have signed paperwork which will be entered into your electronic medical record.  These signatures attest to the fact that that the information above on your After Visit Summary has been reviewed and is understood.  Full responsibility of the confidentiality of this discharge information lies with you and/or your care-partner.

## 2021-06-28 NOTE — Progress Notes (Signed)
Pt's states no medical or surgical changes since previsit or office visit. 

## 2021-06-28 NOTE — Op Note (Signed)
[facility] Patient Name: Molly Peterson Procedure Date: 06/28/2021 1:07 PM MRN: [id number] Endoscopist: Ladene Artist , MD Age: 56 Referring MD:  Date of Birth: 12/01/1965 Gender: Female Account #: [id number] Procedure:                Colonoscopy Indications:              High risk colon cancer surveillance: Personal                            history of traditional serrated adenoma of the colon Medicines:                Monitored Anesthesia Care Procedure:                Pre-Anesthesia Assessment:                           - Prior to the procedure, a History and Physical                            was performed, and patient medications and                            allergies were reviewed. The patient's tolerance of                            previous anesthesia was also reviewed. The risks                            and benefits of the procedure and the sedation                            options and risks were discussed with the patient.                            All questions were answered, and informed consent                            was obtained. Prior Anticoagulants: The patient has                            taken no previous anticoagulant or antiplatelet                            agents. ASA Grade Assessment: II - A patient with                            mild systemic disease. After reviewing the risks                            and benefits, the patient was deemed in                            satisfactory condition to undergo the procedure.  After obtaining informed consent, the colonoscope                            was passed under direct vision. Throughout the                            procedure, the patient's blood pressure, pulse, and                            oxygen saturations were monitored continuously. The                            PCF-HQ190L Colonoscope was introduced through the                            anus and  advanced to the the cecum, identified by                            appendiceal orifice and ileocecal valve. The                            ileocecal valve, appendiceal orifice, and rectum                            were photographed. The quality of the bowel                            preparation was adequate after extensive lavage and                            suction. The colonoscopy was performed without                            difficulty. The patient tolerated the procedure                            well. Scope In: 1:37:37 PM Scope Out: 1:51:01 PM Scope Withdrawal Time: 0 hours 11 minutes 13 seconds  Total Procedure Duration: 0 hours 13 minutes 24 seconds  Findings:                 External hemorrhoids were found on perianal exam.                           Two sessile polyps were found in the ascending                            colon. The polyps were 7 to 8 mm in size. These                            polyps were removed with a cold snare. Resection                            and retrieval were complete.  Many small-mouthed diverticula were found in the                            left colon. There was no evidence of diverticular                            bleeding.                           Internal hemorrhoids were found during                            retroflexion. The hemorrhoids were moderate and                            Grade I (internal hemorrhoids that do not prolapse).                           The exam was otherwise without abnormality on                            direct and retroflexion views. Complications:            No immediate complications. Estimated blood loss:                            None. Estimated Blood Loss:     Estimated blood loss: none. Impression:               - External hemorrhoids found on perianal exam.                           - Two 7 to 8 mm polyps in the ascending colon,                            removed with  a cold snare. Resected and retrieved.                           - Mild left colon diverticulosis.                           - Internal hemorrhoids.                           - The examination was otherwise normal on direct                            and retroflexion views. Recommendation:           - Repeat colonoscopy after studies are complete for                            surveillance based on pathology results with a more                            extensive bowel prep.                           -  Patient has a contact number available for                            emergencies. The signs and symptoms of potential                            delayed complications were discussed with the                            patient. Return to normal activities tomorrow.                            Written discharge instructions were provided to the                            patient.                           - High fiber diet.                           - Continue present medications.                           - Await pathology results. Ladene Artist, MD 06/28/2021 1:57:24 PM This report has been signed electronically.

## 2021-06-28 NOTE — Progress Notes (Signed)
Sedate, gd SR, tolerated procedure well, VSS, report to RN 

## 2021-06-30 ENCOUNTER — Telehealth: Payer: Self-pay | Admitting: *Deleted

## 2021-06-30 NOTE — Telephone Encounter (Signed)
°  Follow up Call-  Call back number 06/28/2021  Post procedure Call Back phone  # [phone]  Permission to leave phone message Yes  Some recent data might be hidden     Patient questions:  Do you have a fever, pain , or abdominal swelling? No. Pain Score  0 *  Have you tolerated food without any problems? Yes.    Have you been able to return to your normal activities? Yes.    Do you have any questions about your discharge instructions: Diet   No. Medications  No. Follow up visit  No.  Do you have questions or concerns about your Care? No.  Actions: * If pain score is 4 or above: No action needed, pain <4.pt did c/o hemorrhoid discomfort and is using preparation-H .Pt says right now while sitting no discomfort but just walking fron car into work the discomfort comes back Birth weight not on file discussed recommendation by Dr Fuller Plan for high fiber diet with increases water intake and to call office if not better after Preparation H daily for a week.

## 2021-07-03 ENCOUNTER — Encounter: Payer: Self-pay | Admitting: Family

## 2021-07-03 ENCOUNTER — Other Ambulatory Visit: Payer: Self-pay | Admitting: Internal Medicine

## 2021-07-09 ENCOUNTER — Telehealth: Payer: Self-pay | Admitting: Physician Assistant

## 2021-07-09 MED ORDER — SPIRONOLACTONE 25 MG PO TABS: 12.5000 mg | ORAL_TABLET | Freq: Every day | ORAL | 1 refills | Status: DC

## 2021-07-09 NOTE — Telephone Encounter (Signed)
55 year old female with a history of hypertension.  She takes metoprolol succinate and spironolactone.  Her refill for spironolactone was called in late and she has been out of it for the past few days.  She wanted to make sure that this was going to be okay.  She plans to pick it up tomorrow.  I advised her to monitor blood pressure make sure is not running high.  Otherwise, she can resume her medication tomorrow.  It does not look like a refill was actually sent in.  Therefore, I have sent in another refill for her today. Richardson Dopp, PA-C 07/09/2021 2:01 PM

## 2021-07-10 ENCOUNTER — Other Ambulatory Visit: Payer: Self-pay | Admitting: Internal Medicine

## 2021-07-10 ENCOUNTER — Encounter: Payer: Self-pay | Admitting: Gastroenterology

## 2021-07-10 ENCOUNTER — Other Ambulatory Visit: Payer: Self-pay

## 2021-07-10 ENCOUNTER — Telehealth: Payer: Self-pay

## 2021-07-10 MED ORDER — METOPROLOL SUCCINATE ER 50 MG PO TB24: 50.0000 mg | ORAL_TABLET | Freq: Every day | ORAL | 0 refills | Status: DC

## 2021-07-10 NOTE — Telephone Encounter (Addendum)
Spoke with the pt and made her an appt 09/05/21... she has all of the refills that she needs to hold her until her OV.

## 2021-07-10 NOTE — Telephone Encounter (Signed)
Pt called in requesting a refill. I sent in the refill and told the pt she was due a f/u appt. I tried to make the appt for her but the first available with Dr Harrington Challenger was in May. She said she couldn't do that because she works at [employer] and the graduation is that month. She was pretty upset that it was going to be May so I told her I would send you a message to see if you could get her in sooner. She isn't having any issues.

## 2021-07-12 ENCOUNTER — Other Ambulatory Visit: Payer: Self-pay

## 2021-07-12 ENCOUNTER — Ambulatory Visit (INDEPENDENT_AMBULATORY_CARE_PROVIDER_SITE_OTHER): Payer: BC Managed Care – PPO | Admitting: Internal Medicine

## 2021-07-12 ENCOUNTER — Encounter: Payer: Self-pay | Admitting: Internal Medicine

## 2021-07-12 DIAGNOSIS — I10 Essential (primary) hypertension: Secondary | ICD-10-CM

## 2021-07-12 DIAGNOSIS — N62 Hypertrophy of breast: Secondary | ICD-10-CM

## 2021-07-12 DIAGNOSIS — R52 Pain, unspecified: Secondary | ICD-10-CM

## 2021-07-12 NOTE — Assessment & Plan Note (Signed)
Most likely related to spironolactone Will stop this

## 2021-07-12 NOTE — Assessment & Plan Note (Signed)
BP Readings from Last 3 Encounters:  07/12/21 126/84  06/28/21 124/76  02/17/21 130/86   Will stop the spironolactone Continue metoprolol 50mg  daily Consider going back to HCTZ if BP goes up

## 2021-07-12 NOTE — Patient Instructions (Signed)
Please stop the rosuvastatin and sprionolactone. Monitor your blood pressure once or twice a week at home

## 2021-07-12 NOTE — Progress Notes (Signed)
Subjective:    Patient ID: Molly Peterson, female    DOB: Jul 31, 1965, 56 y.o.   MRN: [id number]  HPI Here due to concerns about recent weight gain  She has gained 10# rather rapidly Eats well--describes healthy diet Works out frequently Very frustrated  Has body aches Joint and body aches Similar symptoms in 2017---had weight gain (a lot in her breasts) Breasts are even bigger now---heavy and uncomfortable  Has had bursitis/pain in knees Left shoulder has bothered her Everything seems to swell--joints Some hand swelling  LMP 2006 Had menopausal symptoms like 10 years ago Random hot flashes, etc since then (but not recent)  Exercise tolerance is good No chest pain or SOB with exercise Walks a mile three times a day--then aerobic work out at home No ankle swelling Sleeps on 2 pillows---no PND Occasional heart flutter---at rest at night (monitor 2019--short PAT)  Current Outpatient Medications on File Prior to Visit  Medication Sig Dispense Refill   ALPRAZolam (XANAX) 0.25 MG tablet Take 1 tablet (0.25 mg total) by mouth daily as needed for anxiety. (Patient taking differently: Take 0.25 mg by mouth at bedtime as needed for sleep.) 15 tablet 0   ketotifen (ZADITOR) 0.025 % ophthalmic solution Place 1 drop into both eyes daily.     Loratadine 10 MG CAPS Take 10 mg by mouth daily.     metoprolol succinate (TOPROL-XL) 50 MG 24 hr tablet Take 1 tablet (50 mg total) by mouth daily. TAKE 1 TABLET BY MOUTH DAILY WITH OR IMMEDIATLEY FOLLOWING A MEAL Strength: 50 mg 90 tablet 0   risedronate (ACTONEL) 150 MG tablet Take 150 mg by mouth every 30 (thirty) days.     rosuvastatin (CRESTOR) 5 MG tablet TAKE 1 TABLET BY MOUTH DAILY. 90 tablet 3   spironolactone (ALDACTONE) 25 MG tablet Take 0.5 tablets (12.5 mg total) by mouth daily. 45 tablet 1   No current facility-administered medications on file prior to visit.    Allergies  Allergen Reactions   Latex Itching and Swelling    Sulfa Antibiotics     Dizziness    Past Medical History:  Diagnosis Date   Acute pharyngitis    ANXIETY 09/18/2006   on PRN meds for situational anxiety   Chronic kidney disease    hx kidney stones   DEPRESSION 09/18/2006   DERMATITIS, SEBORRHEIC NEC 01/06/2007   Edema    symptom   Flatulence, eructation, and gas pain    Hyperlipidemia    on meds   HYPERTENSION 09/18/2006   on meds   Osteopenia    Palpitations 09/18/2006   REACTION, ACUTE STRESS W/EMOTIONAL DSTURB 12/23/2006   Seasonal allergies    Unspecified constipation    Urinary tract infection, site not specified     Past Surgical History:  Procedure Laterality Date   ABDOMINAL HYSTERECTOMY     partial-still has ovaries   APPENDECTOMY  unknown   CESAREAN SECTION     x 2   CHOLECYSTECTOMY     COLONOSCOPY  2017   MS-MAC-goor prep-int/ext hem/SSP/adenoma-5 yr recall   CYSTOSCOPY  2020   POLYPECTOMY  2017   SSP/adenoma   TONSILLECTOMY  unknown   WISDOM TOOTH EXTRACTION      Family History  Problem Relation Age of Onset   Colon polyps Mother    Heart disease Mother        PVC _0  24   Atrial fibrillation Mother 63   Colon polyps Father    Heart disease Father  MI _0  78 , CABG _1    Hypertension Father    Hyperlipidemia Father    Hearing loss Father    Heart attack Father 108   Asthma Father    Kidney cancer Father    Dementia Maternal Grandmother    Heart failure Maternal Grandfather    Heart attack Maternal Grandfather    Stroke Paternal Grandmother 60   Heart attack Paternal Grandfather        young   Depression Son    Colon cancer Neg Hx    Esophageal cancer Neg Hx    Stomach cancer Neg Hx    Pancreatic cancer Neg Hx    Rectal cancer Neg Hx     Social History   Socioeconomic History   Marital status: Married    Spouse name: Iona Beard   Number of children: 2   Years of education: [place]   Highest education level: Not on file  Occupational History    Employer:  UNEMPLOYED  Tobacco Use   Smoking status: Former    Packs/day: 0.25    Years: 16.00    Pack years: 4.00    Types: Cigarettes    Quit date: 06/18/2004    Years since quitting: 17.0   Smokeless tobacco: Never  Vaping Use   Vaping Use: Never used  Substance and Sexual Activity   Alcohol use: Not Currently    Comment: occasionally-no specific pattern, rare   Drug use: No   Sexual activity: Yes    Birth control/protection: Surgical  Other Topics Concern   Not on file  Social History Narrative   Lives with Playita Cortada, Husband   2 Children - Sales executive (lives at home) and Merrilee Seashore   Enjoy: not sure anymore, attending [employer] sports events in college   Work: Teacher, early years/pre at [employer] Strain: Not on [employer] Insecurity: Not on file  Transportation Needs: Not on file  Physical Activity: Not on file  Stress: Not on file  Social Connections: Not on file  Intimate Partner Violence: Not on file   Review of Systems Extremely high stress level--45 year old son with narcolepsy (now "driving [place] crazy"---not working, not going out, Social research officer, government). Lives in grandparent's house (rental) Bowels daily---but different since hysterectomy No fevers      Objective:   Physical Exam Constitutional:      Appearance: Normal appearance.  HENT:     Mouth/Throat:     Pharynx: No oropharyngeal exudate or posterior oropharyngeal erythema.  Cardiovascular:     Rate and Rhythm: Normal rate and regular rhythm.     Pulses: Normal pulses.     Heart sounds: No murmur heard.   No gallop.  Pulmonary:     Effort: Pulmonary effort is normal.     Breath sounds: Normal breath sounds. No wheezing or rales.  Abdominal:     Palpations: Abdomen is soft.     Tenderness: There is no abdominal tenderness.  Musculoskeletal:     Cervical back: Neck supple.     Right lower leg: No edema.     Left lower leg: No edema.     Comments: Puffiness in fingers but no synovitis   Lymphadenopathy:     Cervical: No cervical adenopathy.  Skin:    Findings: No rash.  Neurological:     Mental Status: She is alert.  Psychiatric:        Mood and Affect: Mood normal.  Behavior: Behavior normal.           Assessment & Plan:

## 2021-07-12 NOTE — Assessment & Plan Note (Signed)
Reviewed negative recent blood work No clear synovitis Might be muscular Will try off the statin Recheck in a few weeks Recent CT negative (in May)

## 2021-07-13 ENCOUNTER — Telehealth: Payer: Self-pay | Admitting: Internal Medicine

## 2021-07-13 NOTE — Telephone Encounter (Addendum)
Pt called to report that Dr. Silvio Pate has her holding her Crestor and Spironolactone due to body aches, breat enlargement and pain... she says he has not felt well and needed some intervention to her complaints... I advised her hat Dr. Harrington Challenger would agree and to give it a few weeks to see if she has any improvement... she says that the Spironolactone never helped with her swelling anyways.   She will keep track of her BP and she will keep her appt 09/05/21 with Dr. Harrington Challenger.

## 2021-07-13 NOTE — Telephone Encounter (Signed)
Patient called stating she was at her PCP office yesterday and he recommend she stop taking spironolactone and crestor.  This was based upon symptoms she was having. She wanted to make sure this was okay first before she just stops taking her medication.

## 2021-07-17 ENCOUNTER — Telehealth: Payer: Self-pay | Admitting: Gastroenterology

## 2021-07-17 MED ORDER — DICYCLOMINE HCL 10 MG PO CAPS: 10.0000 mg | ORAL_CAPSULE | Freq: Three times a day (TID) | ORAL | 2 refills | Status: DC

## 2021-07-17 NOTE — Telephone Encounter (Signed)
Inbound call from patient states she have not felt right since she had her procedure 1/11 and have questions.

## 2021-07-17 NOTE — Telephone Encounter (Signed)
Patient reports that she is having lower abdominal pain since the colonoscopy. She also reports that she is having an oily sheen in the water with a BM, since the colonoscopy.  She has not started any new medications and has actually stopped taking supplements.  She reports her stools are soft and semi-formed since having her gallbladder removed.

## 2021-07-17 NOTE — Telephone Encounter (Signed)
Patient notified .  New rx sent She understands to call back for additional questions or concerns.

## 2021-07-17 NOTE — Telephone Encounter (Signed)
Sometimes the bowel prep can induce a change in bowel function.  This does not sound like a colonoscopy related complication.  Trial of dicyclomine 10 mg p.o. 3 times daily AC as needed, #90, 2 refills.  If her symptoms persist recommend an office visit with me or an APP for further evaluation

## 2021-07-26 ENCOUNTER — Other Ambulatory Visit: Payer: Self-pay

## 2021-07-26 ENCOUNTER — Encounter: Payer: Self-pay | Admitting: Dietician

## 2021-07-26 ENCOUNTER — Encounter: Payer: BC Managed Care – PPO | Attending: Family Medicine | Admitting: Dietician

## 2021-07-26 VITALS — Ht 62.0 in | Wt 148.6 lb

## 2021-07-26 DIAGNOSIS — R1084 Generalized abdominal pain: Secondary | ICD-10-CM

## 2021-07-26 DIAGNOSIS — R635 Abnormal weight gain: Secondary | ICD-10-CM

## 2021-07-26 DIAGNOSIS — I10 Essential (primary) hypertension: Secondary | ICD-10-CM

## 2021-07-26 NOTE — Progress Notes (Signed)
Medical Nutrition Therapy: Visit start time: 1400  end time: 1530  Assessment:  Diagnosis: abnormal weight gain Past medical history: hypertension, hyperlipidemia Psychosocial issues/ stress concerns: high stress level per patient; feels she is not dealing well with her stress but no stress eating  Preferred learning method:  Auditory Visual   Current weight: 148.6lbs with shoes   Height: 5'2" BMI: 27  Medications, supplements: reconciled list in medical record  Progress and evaluation:  Participated in [facility] lifestyle program at [facility] and ended up gaining weight. Feels uncomfortable at current weight, increased 2 clothing sizes in past year. Breast size increase 3 cup sizes within a short period of time. Does crave something sweet in evenings, limits to small portion dark chocolate Had hysterectomy at age 84, has not had menopausal symptoms, but has experienced changes since taking estradiol which led to breast enlargement and weight gain. Had gallbladder surgery one year ago due to polyps (no symptoms) and has since had periodic abdominal pain, episodes of loose stools with oily film; less tolerance for ice cream and milk Reports high BP since 20s, swelling in hands Triglycerides were elevated at 211 (06/14/21), cholesterol levels within normal range.  Used MyFitness Pal for a while to track intake, was below 1300kcal  Physical activity: walking  Dietary Intake:  Usual eating pattern includes 3 meals and 2-3 snacks per day. Dining out frequency: 1 meals per week.  Breakfast: 8oz warm water with lemon, 8oz black coffee; 9:30am-- 2good yogurt plain with black/ blueberries + granola 1 tbsp Snack: orange or banana Lunch: home -- tuna with crackers + 2 slices mozzarella; tuna or peanut butter sandwich on lite bread Snack: cherries + apple/ tangelo/ banana Supper: sweet potato with butter, cinnamon; baked potato with stewed tomato and mozz cheese; salad with shrimp/ fish/ chicken; air  fried tortellini with pasta sauce; husband sometimes cooks, ie spaghetti from scratch Snack: triscuits 4-5 + 4oz juice, occ handful cherries; rice cake toasted with few choc chips and marshmallow;  Beverages: water 64oz daily; OJ, coffee in am, tea infused with fruit or mint  Nutrition Care Education: Topics covered:  Basic nutrition: appropriate nutrient balance, general nutrition guidelines    Weight control: importance of low sugar and low fat choices, managing hunger symptoms; goal of 1200-1400kcal daily with current activity level; options for changing exercise to include more strength training; role of stress and stress hormones, estrogen.  Hypertension, Hyperlipidemia: Mediterranean diet guidelines   Nutritional Diagnosis:  Nixon-3.4 Unintentional weight gain As related to possible stress effects, hormonal changes.  As evidenced by patient reported rapid weight gain in recent months to current BMI of 27 despite healthy food choices and portions.  Intervention:  Instruction and discussion as noted above. Performed body composition analysis, which estimated BMR at 1198 kcal.  Established nutrition goals with input from patient, encouraged inclusion of protein source with each meal and with snacks of fruit.   Education Materials given:  Mediterranean diet Visit summary with goals/ instructions   Learner/ who was taught:  Patient    Level of understanding: Verbalizes/ demonstrates competency   Demonstrated degree of understanding via:   Teach back Learning barriers: None  Willingness to learn/ readiness for change: Eager, change in progress   Monitoring and Evaluation:  Dietary intake, exercise, stress management, and body weight      follow up:  09/20/21 at 8:30am

## 2021-07-26 NOTE — Patient Instructions (Signed)
Start keeping a food journal for at least several days a week.  Make sure to include a protein food as part of dinner and consider adding a small portion of nuts or nut butter or hummus. Protein helps control hunger longer and stabilizes blood sugar/ insulin levels. Consider some strength training exercises a few days a week.

## 2021-07-31 DIAGNOSIS — H16143 Punctate keratitis, bilateral: Secondary | ICD-10-CM | POA: Diagnosis not present

## 2021-08-10 ENCOUNTER — Telehealth: Payer: Self-pay | Admitting: Gastroenterology

## 2021-08-10 NOTE — Telephone Encounter (Signed)
Left message for patient to call back  

## 2021-08-10 NOTE — Telephone Encounter (Signed)
Inbound call from pt requesting a call back stating that she has not seen a difference with her symptoms since her colonoscopy. She stated that she is still having the cramping in her stomach and she is having oily deposits. Pt is taking Bentyl and states it's not helping. Please advise. Thank you.

## 2021-08-10 NOTE — Telephone Encounter (Signed)
Patient returned call & stated she has been experiencing abdominal cramping and oily deposits ever since her colonoscopy, unrelieved by dicyclomine TID. Per previous note, if symptoms were unrelieved to schedule appt with Dr. Fuller Plan or an APP. Patient has been scheduled for follow up with Vicie Mutters, PA on 08/14/21 at 1:30 pm.

## 2021-08-11 NOTE — Progress Notes (Signed)
08/14/2021 Molly Peterson [id number] [birth year]   ASSESSMENT AND PLAN:   Generalized abdominal pain -     DG Abd 1 View; Future -     CBC with Differential/Platelet; Future After colonoscopy some AB pain, will get KUB to evaluate for free air in AB, stool burden.  - dicyclomine not helping, will check  - do 1 month probiotic, add on fiber  Abdominal bloating -     Pancreatic elastase, fecal; Future Has history of pancreatic divisum, will check for pancreatic elastase.   Loose stools -     Pancreatic elastase, fecal; Future - add bulk/fiber agent, continue low fat diet since possible component of bile acid loose stools since choley, will get pancreatic elastase.  Normal colon 07/17/2021.     History of Present Illness:  56 y.o. female  with a past medical history of hypertension and others listed below, known to Dr. Fuller Plan returns to clinic today for evaluation of abdominal cramping, and oily stools since her colonoscopy 06/2021. Patient was given trial of dicyclomine 01/30 for possible change in bowel function post prep, this has not helped so scheduled for follow-up here.  Directly after colonoscopy bilateral sides, worse after eating.  She states she had abnormal stools the same since she had GB out, have been mushy.  She has brown deposits of oil around the ring of the toilet.  She has been on dicyclomine 3 x a day.  She states after she ate dinner last night, had salad, feels "contractions" bilateral flanks.  Has gained 15 lbs in past year, feels bloated all the time.  No N,V.  She does not eat fried foods, she eats healthy.   Colonoscopy 06/2021 1 adenomatous polyp, 1 hyperplastic polyp, left colon diverticulosis, internal hemorrhoids, recall 7 years 10/22/2020 CT angio chest, abdomen, pelvis for abdominal pain showed no PE, stranding within region of hysterectomy bed, otherwise unremarkable Colonoscopy in 04/2016 showed one SSP/SSA polyp, internal and external  hemorrhoids CT AP in 01/2019 and abd [place] in 05/2019 showed 7 mm and 2 mm gallbladder polyps, at least partial pancreas divisum, mild sigmoid colon diverticulosis, left kidney stone. S/p cholecystectomy March 2021  Current Medications:   Current Outpatient Medications (Endocrine & Metabolic):    risedronate (ACTONEL) 150 MG tablet, Take 150 mg by mouth every 30 (thirty) days.  Current Outpatient Medications (Cardiovascular):    metoprolol succinate (TOPROL-XL) 50 MG 24 hr tablet, Take 1 tablet (50 mg total) by mouth daily. TAKE 1 TABLET BY MOUTH DAILY WITH OR IMMEDIATLEY FOLLOWING A MEAL Strength: 50 mg  Current Outpatient Medications (Respiratory):    Loratadine 10 MG CAPS, Take 10 mg by mouth daily.    Current Outpatient Medications (Other):    ALPRAZolam (XANAX) 0.25 MG tablet, Take 1 tablet (0.25 mg total) by mouth daily as needed for anxiety. (Patient taking differently: Take 0.25 mg by mouth at bedtime as needed for sleep.)   dicyclomine (BENTYL) 10 MG capsule, Take 1 capsule (10 mg total) by mouth 3 (three) times daily before meals.   ketotifen (ZADITOR) 0.025 % ophthalmic solution, Place 1 drop into both eyes daily.   Loteprednol Etabonate (EYSUVIS) 0.25 % SUSP, Apply to eye.  Surgical History:  She  has a past surgical history that includes Appendectomy (unknown); Tonsillectomy (unknown); Abdominal hysterectomy; Cesarean section; Cholecystectomy; Wisdom tooth extraction; Colonoscopy (2017); Polypectomy (2017); and Cystoscopy (2020). Family History:  Her family history includes Asthma in her father; Atrial fibrillation (age of onset: 13) in her mother; Colon  polyps in her father and mother; Dementia in her maternal grandmother; Depression in her son; Hearing loss in her father; Heart attack in her maternal grandfather and paternal grandfather; Heart attack (age of onset: 62) in her father; Heart disease in her father and mother; Heart failure in her maternal grandfather; Hyperlipidemia  in her father; Hypertension in her father; Kidney cancer in her father; Stroke (age of onset: 66) in her paternal grandmother. Social History:   reports that she quit smoking about 17 years ago. Her smoking use included cigarettes. She has a 4.00 pack-year smoking history. She has never used smokeless tobacco. She reports that she does not currently use alcohol. She reports that she does not use drugs.  Current Medications, Allergies, Past Medical History, Past Surgical History, Family History and Social History were reviewed in [employer] record.  Physical Exam: BP 120/76    Pulse 76    Ht 5\' 2"  (1.575 m)    Wt 147 lb (66.7 kg)    BMI 26.89 kg/m  General:   Pleasant, well developed female in no acute distress Eyes: sclerae anicteric,conjunctive pink  Heart:  regular rate and rhythm, no murmurs or gallops Pulm: Clear anteriorly; no wheezing Abdomen:  Soft, Flat AB, skin exam normal, Normal bowel sounds. mild tenderness in the lower abdomen. Without guarding and Without rebound, without hepatomegaly. Extremities:  Without edema. Peripheral pulses intact.  Neurologic:  Alert and  oriented x4;  grossly normal neurologically. Skin:   Dry and intact without significant lesions or rashes. Psychiatric: Demonstrates good judgement and reason without abnormal affect or behaviors.  Vladimir Crofts, PA-C 08/14/21

## 2021-08-14 ENCOUNTER — Ambulatory Visit (INDEPENDENT_AMBULATORY_CARE_PROVIDER_SITE_OTHER): Payer: BC Managed Care – PPO | Admitting: Physician Assistant

## 2021-08-14 ENCOUNTER — Other Ambulatory Visit: Payer: BC Managed Care – PPO

## 2021-08-14 ENCOUNTER — Encounter: Payer: Self-pay | Admitting: Physician Assistant

## 2021-08-14 ENCOUNTER — Ambulatory Visit (INDEPENDENT_AMBULATORY_CARE_PROVIDER_SITE_OTHER)
Admission: RE | Admit: 2021-08-14 | Discharge: 2021-08-14 | Disposition: A | Discharge status: Home / Self Care | Payer: BC Managed Care – PPO | Source: Ambulatory Visit | Attending: Physician Assistant | Admitting: Physician Assistant

## 2021-08-14 ENCOUNTER — Other Ambulatory Visit: Payer: Self-pay

## 2021-08-14 VITALS — BP 120/76 | HR 76 | Ht 62.0 in | Wt 147.0 lb

## 2021-08-14 DIAGNOSIS — R1084 Generalized abdominal pain: Secondary | ICD-10-CM

## 2021-08-14 DIAGNOSIS — R140 Abdominal distension (gaseous): Secondary | ICD-10-CM

## 2021-08-14 DIAGNOSIS — R195 Other fecal abnormalities: Secondary | ICD-10-CM

## 2021-08-14 DIAGNOSIS — R109 Unspecified abdominal pain: Secondary | ICD-10-CM | POA: Diagnosis not present

## 2021-08-14 NOTE — Patient Instructions (Addendum)
FIBER SUPPLEMENT  Benefiber or Citracel is good for constipation/diarrhea/irritable bowel syndrome, it helps with weight loss and can help lower your bad cholesterol. Please do 1 TBSP in the morning in water, coffee, or tea. It can take up to a month before you can see a difference with your bowel movements. It is cheapest from [employer], [employer], [employer].   Recommend increasing water and activity.  Get a squatty potty to use at home or try a stool, goal is to get your knees above your hips during a bowel movement.  Go to the ER if unable to pass gas, severe AB pain, unable to hold down food, any shortness of breath of chest pain.   Can do probiotic x 1 month and then stop, align or florastor  Stop dicyclomine  Your provider has requested that you go to the basement level for lab work before leaving today. Press "B" on the elevator. The lab is located at the first door on the left as you exit the elevator.

## 2021-08-14 NOTE — Addendum Note (Signed)
Addended by: Azzie Almas on: 08/14/2021 02:38 PM   Modules accepted: Orders

## 2021-08-15 LAB — CBC WITH DIFFERENTIAL/PLATELET
Basophils Absolute: 0 10*3/uL (ref 0.0–0.2)
Basos: 1 %
EOS (ABSOLUTE): 0.2 10*3/uL (ref 0.0–0.4)
Eos: 3 %
Hematocrit: 41 % (ref 34.0–46.6)
Hemoglobin: 13.5 g/dL (ref 11.1–15.9)
Immature Grans (Abs): 0 10*3/uL (ref 0.0–0.1)
Immature Granulocytes: 0 %
Lymphocytes Absolute: 2.5 10*3/uL (ref 0.7–3.1)
Lymphs: 39 %
MCH: 29.3 pg (ref 26.6–33.0)
MCHC: 32.9 g/dL (ref 31.5–35.7)
MCV: 89 fL (ref 79–97)
Monocytes Absolute: 0.4 10*3/uL (ref 0.1–0.9)
Monocytes: 6 %
Neutrophils Absolute: 3.3 10*3/uL (ref 1.4–7.0)
Neutrophils: 51 %
Platelets: 220 10*3/uL (ref 150–450)
RBC: 4.6 x10E6/uL (ref 3.77–5.28)
RDW: 12.4 % (ref 11.7–15.4)
WBC: 6.4 10*3/uL (ref 3.4–10.8)

## 2021-08-16 ENCOUNTER — Telehealth: Payer: Self-pay | Admitting: Gastroenterology

## 2021-08-16 ENCOUNTER — Other Ambulatory Visit: Payer: BC Managed Care – PPO

## 2021-08-16 DIAGNOSIS — R195 Other fecal abnormalities: Secondary | ICD-10-CM

## 2021-08-16 DIAGNOSIS — R140 Abdominal distension (gaseous): Secondary | ICD-10-CM

## 2021-08-16 NOTE — Telephone Encounter (Signed)
Patient called back to follow up on message below states she was given medication and all that did was make her feel worst. Seeking advise because she has to travel for work and does not know how she will manage. ?

## 2021-08-16 NOTE — Telephone Encounter (Signed)
Patient called in complaining of cramping, loose stools, oily deposits, and abd distention. She was seen in the office on 2/27 with [place], [place]. Patient is coming in today to drop off stool sample, however she states she is very uncomfortable and would like further recommendations. Patient advised that more can be determined once results are in, but will make PA aware. ? ?Estill Bamberg, please advise. Thank you! ?

## 2021-08-16 NOTE — Telephone Encounter (Signed)
Patient called complaining that she is still having issues since having her colonoscopy.  She is still having stomach pain that feels like a tightness, contraction-like feeling.  Her stools are loose and leave an brown, oily residue in the water.  The medicine she was given and has taken for three weeks has done absolutely nothing.  It may have fixed the symptoms, but not the problem.  Her stomach is distended and none of her clothes fit her.  She would like to talk to someone about what she should  do and whether or not she should come in to see Dr. Fuller Plan.  She will be submitting a stool sample to the lab sometime today.  Please call patient and advise. ?

## 2021-08-16 NOTE — Telephone Encounter (Signed)
Patient was given results & instructed on recommendations. She was very unhappy with her care that has been provided, and feels that she does not have any answers. She was offered a follow up with Dr. Fuller Plan, and did not accept.  ?

## 2021-08-21 ENCOUNTER — Ambulatory Visit: Payer: BC Managed Care – PPO | Admitting: Dietician

## 2021-08-21 LAB — PANCREATIC ELASTASE, FECAL: Pancreatic Elastase, Fecal: 412 ug Elast./g (ref >200)

## 2021-08-22 DIAGNOSIS — H16223 Keratoconjunctivitis sicca, not specified as Sjogren's, bilateral: Secondary | ICD-10-CM | POA: Diagnosis not present

## 2021-09-04 NOTE — Progress Notes (Signed)
. ?  ? ?Cardiology Office Note ? ? ?Date:  09/05/2021  ? ?ID:  Molly Peterson, DOB 09/25/1965, MRN [id number] ? ?PCP:  Molly Noe, MD  ?Cardiologist:   Molly Carnes, MD  ? ?F/U of HTN and HL  ?  ?History of Present Illness: ?Molly Peterson is a 56 y.o. female with a history of HTN, palpitations, chest pressure, HL     ? Echo in 2020LVEF 60 to 65% with Gr I diastolic dysfunction.   ?The pt had a cronary CT angiogram in 2019    This showed no coronary calcdifcations, or coronary artery disease  DId show minimal  plaquing of the aorta   ?The pt had problems with lisinopril  Had swelling of hands and feet.  Placed on aldactone  Was on HCTZ  HCTZ was stopped   Toprol added    ?In March 2022 had chest prssure   BP high   Anxious    BPs 150s/160s.   Placed on 12.5 aldacone and 1/2 metoprolol ? ?The pt was followed in IM   She was taken off of spironolactone due to breast tenderness    Since stipping she had noted little change ? ?REcently her BP has been very good   110s, 120s.    ?She denie CP  Breathing is OK ?Sh is having significant GI problmes   Really since choley ?Motility is down    Lots of bloating   ? ?She notes her weight is up  10lbs   Cant lose ?Diet ? Breakfast:  yogurt (unsweet), a little granola   Or oatmeal, sinnamon , apple or egg and toast   ? ?Lunch:  Sandwich on whole wheat  PB  SUgar free J   Apple.  Babnana ?Or Chicken and lettuce and humus ?Dinner   Protein, vegg, salad ?One cookie  ? ?Water ? 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 ?Current Meds  ?Medication Sig  ? ALPRAZolam (XANAX) 0.25 MG tablet Take 1 tablet (0.25 mg total) by mouth daily as needed for anxiety.  ? Loratadine 10 MG CAPS Take 10 mg by mouth daily.  ? Loteprednol Etabonate (EYSUVIS) 0.25 % SUSP Apply to eye.  ? metoprolol succinate (TOPROL-XL) 50 MG 24 hr tablet Take 1 tablet (50 mg total) by mouth daily. TAKE 1 TABLET BY MOUTH DAILY WITH OR IMMEDIATLEY FOLLOWING A MEAL Strength: 50 mg  ?  rosuvastatin (CRESTOR) 10 MG tablet Take 1 tablet (10 mg total) by mouth every other day.  ? ? ? ?Allergies:   Latex and Sulfa antibiotics  ? ?Past Medical History:  ?Diagnosis Date  ? Acute pharyngitis   ? ANXIETY 09/18/2006  ? on PRN meds for situational anxiety  ? Chronic kidney disease   ? hx kidney stones  ? DEPRESSION 09/18/2006  ? DERMATITIS, SEBORRHEIC NEC 01/06/2007  ? Edema   ? symptom  ? Flatulence, eructation, and gas pain   ? Hyperlipidemia   ? on meds  ? HYPERTENSION 09/18/2006  ? on meds  ? Osteopenia   ? Palpitations 09/18/2006  ? REACTION, ACUTE STRESS W/EMOTIONAL DSTURB 12/23/2006  ? Seasonal allergies   ? Unspecified constipation   ? Urinary tract infection, site not specified   ? ? ?Past Surgical History:  ?Procedure Laterality Date  ? ABDOMINAL HYSTERECTOMY    ? partial-still has ovaries  ? APPENDECTOMY  unknown  ? CESAREAN SECTION    ? x 2  ?  CHOLECYSTECTOMY    ? COLONOSCOPY  2017  ? MS-MAC-goor prep-int/ext hem/SSP/adenoma-5 yr recall  ? CYSTOSCOPY  2020  ? POLYPECTOMY  2017  ? SSP/adenoma  ? TONSILLECTOMY  unknown  ? WISDOM TOOTH EXTRACTION    ? ? ? ?Social History:  The patient  reports that she quit smoking about 17 years ago. Her smoking use included cigarettes. She has a 4.00 pack-year smoking history. She has never used smokeless tobacco. She reports that she does not currently use alcohol. She reports that she does not use drugs.  ? ?Family History:  The patient's family history includes Asthma in her father; Atrial fibrillation (age of onset: 71) in her mother; Colon polyps in her father and mother; Dementia in her maternal grandmother; Depression in her son; Hearing loss in her father; Heart attack in her maternal grandfather and paternal grandfather; Heart attack (age of onset: 64) in her father; Heart disease in her father and mother; Heart failure in her maternal grandfather; Hyperlipidemia in her father; Hypertension in her father; Kidney cancer in her father; Stroke (age of  onset: 80) in her paternal grandmother.  ? ? ?ROS:  Please see the history of present illness. All other systems are reviewed and  Negative to the above problem except as noted.  ? ? ?PHYSICAL EXAM: ?VS:  BP 130/80   Pulse 76   Ht _0  (1.575 m)   Wt 148 lb 3.2 oz (67.2 kg)   SpO2 98%   BMI 27.11 kg/m?   ?GEN: Well nourished, well developed, in no acute distress  ?HEENT: normal  ?Neck: no JVD; no carotid bruit ?Cardiac: RRR; no murmurs  ,no LE edema  ?Respiratory:  clear to auscultation bilaterally, ?GI: soft, sl bloated.   Mild diffuse tenderness   ?MS: no deformity Moving all extremities   ?Skin: warm and dry, no rash ?Neuro:  Strength and sensation are intact ?Psych: euthymic mood, full affect ? ? ?EKG:  EKG is  ordered    SR 76 bpm   ? ? ?Lipid Panel ?   ?Component Value Date/Time  ? CHOL 165 06/14/2021 0941  ? TRIG 211 (H) 06/14/2021 0941  ? HDL 44 06/14/2021 0941  ? CHOLHDL 3.8 06/14/2021 0941  ? CHOLHDL 4.8 06/01/2016 1534  ? VLDL 32 (H) 06/01/2016 1534  ? [place] 85 06/14/2021 0941  ? LDLDIRECT 140.0 07/21/2014 0839  ? ?  ? ?Wt Readings from Last 3 Encounters:  ?09/05/21 148 lb 3.2 oz (67.2 kg)  ?08/14/21 147 lb (66.7 kg)  ?07/26/21 148 lb 9.6 oz (67.4 kg)  ?  ? ? ?ASSESSMENT AND PLAN: ? ?1  HTN   BP is good OFF of most meds   This is new   Follow  as outpt ? ?2  Lipids    Off of Zetia   Given mild plaquing of aorta would recomm low dose statin   Crestor 5 mg qod    Lipomed, Lpa and ApoB  along with LFTs ? ?3  Chest discomfort   Denies    ? ?4  PAD   Reviewed CT with pt   Her Calcium score was 0 but she has minimal plaquing in aorta    Rx lipids   ? ? ?5  GI   Very irritbable   Pt with signif discomfort, cramping   ?? Referral to [facility]  Will try to find      ? ? ? ?Current medicines are reviewed at length with the patient today.  The patient  does not have concerns regarding medicines. ? ?Signed, ?Molly Carnes, MD  ?09/05/2021 5:48 PM    ?[facility] [address] ?[address], [place], [place]   [address] ?Phone: [phone]; Fax: [phone]  ? ? ?

## 2021-09-05 ENCOUNTER — Other Ambulatory Visit: Payer: Self-pay

## 2021-09-05 ENCOUNTER — Ambulatory Visit (INDEPENDENT_AMBULATORY_CARE_PROVIDER_SITE_OTHER): Payer: BC Managed Care – PPO | Admitting: Internal Medicine

## 2021-09-05 ENCOUNTER — Encounter: Payer: Self-pay | Admitting: Internal Medicine

## 2021-09-05 VITALS — BP 130/80 | HR 76 | Ht 62.0 in | Wt 148.2 lb

## 2021-09-05 DIAGNOSIS — I700 Atherosclerosis of aorta: Secondary | ICD-10-CM | POA: Diagnosis not present

## 2021-09-05 DIAGNOSIS — Z79899 Other long term (current) drug therapy: Secondary | ICD-10-CM

## 2021-09-05 DIAGNOSIS — E785 Hyperlipidemia, unspecified: Secondary | ICD-10-CM

## 2021-09-05 MED ORDER — ROSUVASTATIN CALCIUM 10 MG PO TABS: 10.0000 mg | ORAL_TABLET | ORAL | 3 refills | Status: DC

## 2021-09-05 NOTE — Patient Instructions (Addendum)
Medication Instructions:  ?Crestor 5 mg every other day  ?*If you need a refill on your cardiac medications before your next appointment, please call your pharmacy* ? ? ?Lab Work: ?Lipomed and Hepatic in 8 weeks  ?If you have labs (blood work) drawn today and your tests are completely normal, you will receive your results only by: ?MyChart Message (if you have MyChart) OR ?A paper copy in the mail ?If you have any lab test that is abnormal or we need to change your treatment, we will call you to review the results. ? ? ?Testing/Procedures: ?none ? ? ?Follow-Up: ?At [facility], you and your health needs are our priority.  As part of our continuing mission to provide you with exceptional heart care, we have created designated Provider Care Teams.  These Care Teams include your primary Cardiologist (physician) and Advanced Practice Providers (APPs -  Physician Assistants and Nurse Practitioners) who all work together to provide you with the care you need, when you need it. ? ?We recommend signing up for the patient portal called "MyChart".  Sign up information is provided on this After Visit Summary.  MyChart is used to connect with patients for Virtual Visits (Telemedicine).  Patients are able to view lab/test results, encounter notes, upcoming appointments, etc.  Non-urgent messages can be sent to your provider as well.   ?To learn more about what you can do with MyChart, go to [email or web address].   ? ?Your next appointment:   ?1 year(s) ? ?The format for your next appointment:   ?In Person ? ?Provider:   ?Dorris Carnes, MD   ? ? ?Other Instructions ?  ?

## 2021-09-07 ENCOUNTER — Telehealth: Payer: Self-pay

## 2021-09-07 MED ORDER — RIFAXIMIN 550 MG PO TABS: 550.0000 mg | ORAL_TABLET | Freq: Three times a day (TID) | ORAL | 0 refills | Status: AC

## 2021-09-07 NOTE — Telephone Encounter (Signed)
Patient called in with complaints of ongoing abdominal pain & bloating. Rated pain 6/10. She has been taking miralax 2-3 times/day, in addition to benefiber. She is having daily bowel movements that are more frequent, however much smaller. Patient expressed that she was very frustrated and dissatisfied with her care that she has received, but would like further recommendations.  ?

## 2021-09-07 NOTE — Telephone Encounter (Signed)
Called patient. She continues to have AB discomfort. Having stools up to 2-3 x a day, small stools but not more. Feels she is bloated, tightness in her stomach.  ?She will be back Monday.  ?She states since her GB was out, her BM consistency changed completely.  ?She has "soft serve stools" since her GB has been out, feels like never regular BM since that time, has had hardness/bloating since that time.  ?Worse since colonoscopy that showed still stool.  ?Having cramping AB pain with oily deposits. Since the colonoscopy.  ?Had Xray that showed transverse colon stool, no obstruction, no free air.  ? ?Cut back on miralax to once  a day and continue fiber.  ? ?Consider methane producing SIBO can do trial of xifaxin.  ?Will need to get savings card online.  ?Will send this in for patient to try.  ?Please go to the ER if you have any severe AB pain, unable to hold down food/water, blood in stool or vomit, chest pain, shortness of breath, or any worsening symptoms.  ?? Need sitz marker test versus anal manometry  ? ?Can consider CT AB ? ? ?

## 2021-09-07 NOTE — Telephone Encounter (Signed)
Staff reminder sent to self to check on patient closer to end of xifaxan trial to see how she is doing.  ?

## 2021-09-13 ENCOUNTER — Telehealth: Payer: Self-pay | Admitting: Physician Assistant

## 2021-09-13 ENCOUNTER — Encounter: Payer: Self-pay | Admitting: Nurse Practitioner

## 2021-09-13 ENCOUNTER — Ambulatory Visit: Payer: BC Managed Care – PPO | Admitting: Nurse Practitioner

## 2021-09-13 ENCOUNTER — Other Ambulatory Visit: Payer: Self-pay

## 2021-09-13 VITALS — BP 110/60 | HR 92 | Temp 97.6°F | Resp 18

## 2021-09-13 DIAGNOSIS — K2100 Gastro-esophageal reflux disease with esophagitis, without bleeding: Secondary | ICD-10-CM

## 2021-09-13 DIAGNOSIS — R10813 Right lower quadrant abdominal tenderness: Secondary | ICD-10-CM

## 2021-09-13 MED ORDER — PANTOPRAZOLE SODIUM 20 MG PO TBEC: 20.0000 mg | DELAYED_RELEASE_TABLET | Freq: Every day | ORAL | 0 refills | Status: DC

## 2021-09-13 MED ORDER — FAMOTIDINE 20 MG PO TABS: 20.0000 mg | ORAL_TABLET | Freq: Two times a day (BID) | ORAL | 0 refills | Status: DC

## 2021-09-13 NOTE — Telephone Encounter (Signed)
Inbound call from patient stating that she had just left her PCP and that she had started a new medication and she had a allergic reaction to the medication. PCP told her that she now has blisters In her throat and needs to be seen. Patient is seeking advice what she needs to do as far as making an appointment. Please advise.  ?

## 2021-09-13 NOTE — Telephone Encounter (Signed)
Spoke with patient regarding Amanda's recommendations. Again, she is very unhappy with the care that is being provided. She declined to be seen with Estill Bamberg or Dr. Fuller Plan, and states "she will be finding another GI practice."  ?

## 2021-09-13 NOTE — Progress Notes (Signed)
? ?Subjective:  ? ? Patient ID: Molly Peterson, female    DOB: 11-06-1965, 56 y.o.   MRN: [id number] ? ?HPI ? ?56 year old female presenting to [facility] clinic with symptoms of pain with swallowing. Symptoms onset within one week of starting Alendronate. She was started on new medication by OBGYN. She has taken one dose.  She has discomfort with swallowing, a raw feeling in her throat and has in the past few days developed a dry cough that worsens when she is laying flat. She has spoken to her pharmacist and is concerned worsening throat symptoms may be secondary to SE from new medication.  ? ?She is also under the care of a GI physician Dr. Fuller Plan. She had a colonoscopy 06/2021 and has been experiencing loose stools with an oily appearance since that time. She has followed up with GI since and has tried dicyclomine without relief and was started on a probiotic as well. She has also had right lower abdominal discomfort. - see full GI noted from 08/14/21 ? ?Had KUB performed while in office 2/27 that evidenced: ?IMPRESSION: ?Large amount of stool present in the right and transverse colon. No ?bowel distention. No free air. ? ?She presents today with concern over possible SE to alendronate vs worsening GERD ? ?Does note she has an oral allergy to certain fruit enzymes ?Routinely drinks 4 ounces of orange juice with dinner, has stopped this in the past week due to worsening symptoms ? ?Today's Vitals  ? 09/13/21 1024  ?BP: 110/60  ?Pulse: 92  ?Resp: 18  ?Temp: 97.6 ?F (36.4 ?C)  ?TempSrc: Tympanic  ?SpO2: 98%  ? ?There is no height or weight on file to calculate BMI.  ?Review of Systems  ?Constitutional: Negative.   ?HENT:  Positive for sore throat.   ?Respiratory:  Positive for cough.   ?Gastrointestinal:  Positive for abdominal pain.  ?     Changes in bowel pattern  ?  ?Genitourinary: Negative.   ?Musculoskeletal: Negative.   ?Neurological: Negative.   ? ?   ?Objective:  ? Physical Exam ?HENT:  ?    Mouth/Throat:  ?   Mouth: Mucous membranes are moist.  ?   Pharynx: No posterior oropharyngeal erythema.  ?Abdominal:  ?   General: Bowel sounds are normal.  ?   Palpations: Abdomen is soft. There is no mass.  ?   Tenderness: There is abdominal tenderness in the right lower quadrant. There is no guarding or rebound.  ?Musculoskeletal:  ?   Cervical back: Normal range of motion.  ?Skin: ?   General: Skin is warm.  ?Neurological:  ?   General: No focal deficit present.  ?   Mental Status: She is alert.  ?Psychiatric:     ?   Mood and Affect: Mood normal.  ? ? ? ? ? ?   ?Assessment & Plan:  ?1. Gastroesophageal reflux disease with esophagitis without hemorrhage ? ?- Ambulatory referral to Gastroenterology patient to call for follow up- encouraged visit with GI to evaluate worsening epigastric symptoms and ongoing stool complaints.  ? ?- famotidine (PEPCID) 20 MG tablet; Take 1 tablet (20 mg total) by mouth 2 (two) times daily for 7 days. Twice daily for one week then twice daily PRN reflux  Dispense: 30 tablet; Refill: 0 ?- pantoprazole (PROTONIX) 20 MG tablet; Take 1 tablet (20 mg total) by mouth daily.  Dispense: 30 tablet; Refill: 0 ? ?2. Right lower quadrant abdominal tenderness without rebound tenderness ? ?-  Ambulatory referral to Gastroenterology ?   ? ?She will stop alendronate until evaluated due to possible SE from medication. Encouraged to follow up with PCP regarding ongoing management of osteopenia  ? ? ? ?

## 2021-09-15 ENCOUNTER — Ambulatory Visit (INDEPENDENT_AMBULATORY_CARE_PROVIDER_SITE_OTHER): Payer: BC Managed Care – PPO | Admitting: Nurse Practitioner

## 2021-09-15 ENCOUNTER — Telehealth: Payer: Self-pay

## 2021-09-15 ENCOUNTER — Encounter: Payer: Self-pay | Admitting: Nurse Practitioner

## 2021-09-15 VITALS — BP 126/78 | HR 96 | Temp 99.8°F | Resp 14 | Ht 62.0 in | Wt 146.0 lb

## 2021-09-15 DIAGNOSIS — R051 Acute cough: Secondary | ICD-10-CM | POA: Diagnosis not present

## 2021-09-15 DIAGNOSIS — R053 Chronic cough: Secondary | ICD-10-CM | POA: Insufficient documentation

## 2021-09-15 DIAGNOSIS — R5081 Fever presenting with conditions classified elsewhere: Secondary | ICD-10-CM | POA: Diagnosis not present

## 2021-09-15 DIAGNOSIS — M791 Myalgia, unspecified site: Secondary | ICD-10-CM | POA: Diagnosis not present

## 2021-09-15 DIAGNOSIS — R509 Fever, unspecified: Secondary | ICD-10-CM | POA: Insufficient documentation

## 2021-09-15 DIAGNOSIS — J029 Acute pharyngitis, unspecified: Secondary | ICD-10-CM | POA: Diagnosis not present

## 2021-09-15 LAB — POCT INFLUENZA A/B
Influenza A, POC: NEGATIVE
Influenza B, POC: NEGATIVE

## 2021-09-15 LAB — POCT RAPID STREP A (OFFICE): Rapid Strep A Screen: NEGATIVE

## 2021-09-15 MED ORDER — GUAIFENESIN-CODEINE 100-10 MG/5ML PO SOLN: 5.0000 mL | Freq: Three times a day (TID) | ORAL | 0 refills | Status: AC | PRN

## 2021-09-15 NOTE — Assessment & Plan Note (Signed)
Been using Mucinex DM and Zarbee's cough syrup.  We will send in codeine-guaifenesin cough medication to help patient rest.  Sedation precautions reviewed ?

## 2021-09-15 NOTE — Assessment & Plan Note (Signed)
COVID test at home negative flu and strep test negative in office.  Patient has constellation of symptoms conducive to COVID.  We will send off PCR test inclusive of COVID, flu A/B, RSV.  Patient will do symptomatic treatment as outlined for her.  Follow-up if no improvement.  Did discuss the possibility of mononucleosis but given patient has had in the past and no posterior lymphadenopathy defer testing as it would not change treatment.  Lungs were clear to auscultation defer chest x-ray currently ?

## 2021-09-15 NOTE — Assessment & Plan Note (Signed)
Discussed using over-the-counter analgesics such as Tylenol and ibuprofen to help with body aches along fever and sore throat.  Encouraged pushing fluids and rest ?

## 2021-09-15 NOTE — Progress Notes (Signed)
? ?Acute Office Visit ? ?Subjective:  ? ? Patient ID: Molly Peterson, female    DOB: [birth year], 56 y.o.   MRN: [id number] ? ?Chief Complaint  ?Patient presents with  ? Sore Throat  ?  Dry cough the last 2 weeks, then this week sore throat, post nasal drip, runny nose, fever this morning 101, chills/sweats. Covid test negative x 3 at home, last one was yesterday 09/14/21.   ? ? ? ?Patient is in today for  ? ?Symptoms started approx 2 weeks ago ?Was on actonel and was switched to fosomax that Monday and then following that she developed buring in the chest and heartburn. Pepcid miralax, benefiber.  She is monitored by GI. States she had throat discomfort along with a globus sensation. They did place her on xifaxin and protonix and the globus sensation has improved. She has also stopped the fosamax that her GYN prescribed ? ?States that she has stopped taking the miralax benefiber ? ?States that she had a fever this morning. States yesterday she noticed more of mucous and congestion along with body aches. Coughing for 2 weeks previously but started being productive yesterday. States she also started having body aches yesterday. She has tested for covid and the Covid test were negatvie ? 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 ?She has used tylenol, motrin. And mucinex dm and zarbess cough supressant  with some relief  ? ?Past Medical History:  ?Diagnosis Date  ? Acute pharyngitis   ? ANXIETY 09/18/2006  ? on PRN meds for situational anxiety  ? Chronic kidney disease   ? hx kidney stones  ? DEPRESSION 09/18/2006  ? DERMATITIS, SEBORRHEIC NEC 01/06/2007  ? Edema   ? symptom  ? Flatulence, eructation, and gas pain   ? Hyperlipidemia   ? on meds  ? HYPERTENSION 09/18/2006  ? on meds  ? Osteopenia   ? Palpitations 09/18/2006  ? REACTION, ACUTE STRESS W/EMOTIONAL DSTURB 12/23/2006  ? Seasonal allergies   ? Unspecified constipation   ? Urinary tract infection, site not specified   ? ? ?Past Surgical History:   ?Procedure Laterality Date  ? ABDOMINAL HYSTERECTOMY    ? partial-still has ovaries  ? APPENDECTOMY  unknown  ? CESAREAN SECTION    ? x 2  ? CHOLECYSTECTOMY    ? COLONOSCOPY  2017  ? MS-MAC-goor prep-int/ext hem/SSP/adenoma-5 yr recall  ? CYSTOSCOPY  2020  ? POLYPECTOMY  2017  ? SSP/adenoma  ? TONSILLECTOMY  unknown  ? WISDOM TOOTH EXTRACTION    ? ? ?Family History  ?Problem Relation Age of Onset  ? Colon polyps Mother   ? Heart disease Mother   ?     PVC _0  55  ? Atrial fibrillation Mother 11  ? Colon polyps Father   ? Heart disease Father   ?     MI _1  25 , CABG _2   ? Hypertension Father   ? Hyperlipidemia Father   ? Hearing loss Father   ? Heart attack Father 1  ? Asthma Father   ? Kidney cancer Father   ? Dementia Maternal Grandmother   ? Heart failure Maternal Grandfather   ? Heart attack Maternal Grandfather   ? Stroke Paternal Grandmother 18  ? Heart attack Paternal Grandfather   ?     young  ? Depression Son   ? Colon cancer Neg Hx   ? Esophageal cancer Neg Hx   ? Stomach cancer Neg Hx   ? Pancreatic  cancer Neg Hx   ? Rectal cancer Neg Hx   ? ? ?Social History  ? ?Socioeconomic History  ? Marital status: Married  ?  Spouse name: Iona Beard  ? Number of children: 2  ? Years of education: [place]  ? Highest education level: Not on file  ?Occupational History  ?  Employer: UNEMPLOYED  ?Tobacco Use  ? Smoking status: Former  ?  Packs/day: 0.25  ?  Years: 16.00  ?  Pack years: 4.00  ?  Types: Cigarettes  ?  Quit date: 06/18/2004  ?  Years since quitting: 17.2  ? Smokeless tobacco: Never  ?Vaping Use  ? Vaping Use: Never used  ?Substance and Sexual Activity  ? Alcohol use: Not Currently  ?  Comment: occasionally-no specific pattern, rare  ? Drug use: No  ? Sexual activity: Yes  ?  Birth control/protection: Surgical  ?Other Topics Concern  ? Not on file  ?Social History Narrative  ? Lives with Iona Beard, Husband  ? 2 Children - Mitch (lives at home) and Merrilee Seashore  ? Enjoy: not sure anymore, attending Nick's sports  events in college  ? Work: Teacher, early years/pre at [employer]  ? ?Social Determinants of Health  ? ?Financial Resource Strain: Not on file  ?Food Insecurity: Not on file  ?Transportation Needs: Not on file  ?Physical Activity: Not on file  ?Stress: Not on file  ?Social Connections: Not on file  ?Intimate Partner Violence: Not on file  ? ? ?Outpatient Medications Prior to Visit  ?Medication Sig Dispense Refill  ? ALPRAZolam (XANAX) 0.25 MG tablet Take 1 tablet (0.25 mg total) by mouth daily as needed for anxiety. 15 tablet 0  ? famotidine (PEPCID) 20 MG tablet Take 1 tablet (20 mg total) by mouth 2 (two) times daily for 7 days. Twice daily for one week then twice daily PRN reflux 30 tablet 0  ? Loteprednol Etabonate (EYSUVIS) 0.25 % SUSP Apply to eye.    ? metoprolol succinate (TOPROL-XL) 50 MG 24 hr tablet Take 1 tablet (50 mg total) by mouth daily. TAKE 1 TABLET BY MOUTH DAILY WITH OR IMMEDIATLEY FOLLOWING A MEAL Strength: 50 mg 90 tablet 0  ? pantoprazole (PROTONIX) 20 MG tablet Take 1 tablet (20 mg total) by mouth daily. 30 tablet 0  ? rifaximin (XIFAXAN) 550 MG TABS tablet Take 1 tablet (550 mg total) by mouth 3 (three) times daily for 14 days. 42 tablet 0  ? rosuvastatin (CRESTOR) 10 MG tablet Take 1 tablet (10 mg total) by mouth every other day. 90 tablet 3  ? alendronate (FOSAMAX) 70 MG tablet Take 70 mg by mouth once a week. (Patient not taking: Reported on 09/13/2021)    ? Loratadine 10 MG CAPS Take 10 mg by mouth daily. (Patient not taking: Reported on 09/13/2021)    ? ?No facility-administered medications prior to visit.  ? ? ?Allergies  ?Allergen Reactions  ? Latex Itching and Swelling  ? Sulfa Antibiotics   ?  Dizziness  ? ? ?Review of Systems  ?Constitutional:  Positive for appetite change. Negative for chills and fever.  ?HENT:  Positive for congestion, ear pain (pressure) and sore throat. Negative for postnasal drip, sinus pressure and sinus pain.   ?Respiratory:  Positive for cough (productive) and  wheezing. Negative for shortness of breath.   ?Cardiovascular:  Negative for chest pain.  ?Gastrointestinal:  Negative for diarrhea, nausea and vomiting.  ?Musculoskeletal:  Positive for arthralgias and myalgias.  ?Neurological:  Positive for dizziness, light-headedness  and headaches.  ? ?   ?Objective:  ?  ?Physical Exam ?Vitals and nursing note reviewed.  ?Constitutional:   ?   Appearance: She is well-developed.  ?HENT:  ?   Right Ear: Tympanic membrane, ear canal and external ear normal.  ?   Left Ear: Tympanic membrane, ear canal and external ear normal.  ?   Mouth/Throat:  ?   Mouth: Mucous membranes are moist.  ?   Pharynx: Posterior oropharyngeal erythema present.  ?Cardiovascular:  ?   Rate and Rhythm: Normal rate and regular rhythm.  ?   Heart sounds: Normal heart sounds.  ?Pulmonary:  ?   Effort: Pulmonary effort is normal.  ?   Breath sounds: Normal breath sounds.  ?Abdominal:  ?   General: Bowel sounds are normal. There is no distension.  ?   Palpations: There is no mass.  ?   Tenderness: There is no abdominal tenderness.  ?   Hernia: No hernia is present.  ?Lymphadenopathy:  ?   Cervical: Cervical adenopathy present.  ?Neurological:  ?   Mental Status: She is alert.  ? ? ?BP 126/78   Pulse 96   Temp 99.8 ?F (37.7 ?C)   Resp 14   Ht _0  (1.575 m)   Wt 146 lb (66.2 kg)   SpO2 97%   BMI 26.70 kg/m?  ?Wt Readings from Last 3 Encounters:  ?09/15/21 146 lb (66.2 kg)  ?09/05/21 148 lb 3.2 oz (67.2 kg)  ?08/14/21 147 lb (66.7 kg)  ? ? ?Health Maintenance Due  ?Topic Date Due  ? HIV Screening  Never done  ? Hepatitis C Screening  Never done  ? Zoster Vaccines- Shingrix (1 of 2) Never done  ? TETANUS/TDAP  06/14/2015  ? COVID-19 Vaccine (4 - Booster for Pfizer series) 08/18/2020  ? INFLUENZA VACCINE  01/16/2021  ? ? ?There are no preventive care reminders to display for this patient. ? ? ?Lab Results  ?Component Value Date  ? TSH 1.390 06/14/2021  ? ?Lab Results  ?Component Value Date  ? WBC 6.4  08/14/2021  ? HGB 13.5 08/14/2021  ? HCT 41.0 08/14/2021  ? MCV 89 08/14/2021  ? PLT 220 08/14/2021  ? ?Lab Results  ?Component Value Date  ? NA 141 06/14/2021  ? K 4.6 06/14/2021  ? CO2 25 06/14/2021  ? GLUCOSE 89

## 2021-09-15 NOTE — Patient Instructions (Signed)
Nice to see you today ?I will be in touch with the swab results ?Self isolate/quarantine until we have the results ?Can use tylenol or ibuprofen over the counter to help with the sore throat, headaches, and body aches. Can use throat spray, cough drops, and warm salt water gargles.  ?Follow up if no improvement or worsening symptoms ?

## 2021-09-15 NOTE — Telephone Encounter (Signed)
Per appt notes pt already has appt scheduled with Romilda Garret NP 09/15/21 at 11:40. Sending note to Romilda Garret NP and Anastasiya CMA. ?

## 2021-09-15 NOTE — Telephone Encounter (Signed)
[facility] - Client ?TELEPHONE ADVICE RECORD ?[facility]? ?Patient ?Name: ?Molly Peterson ?Molly Peterson ?Gender: Female ?DOB: 06/12/1966 ?Age: 56 Molly 52 M 3 D ?Return ?Phone ?Number: ?[id number] ?(Primary) ?Address: ?City/ ?State/ ?Zip: Molly [place] ?[address] ?Client [facility] ?Client Site [facility] ?Provider Waunita Schooner- MD ?Contact Type Call ?Who Is Calling Patient / Member / Family / Caregiver ?Call Type Triage / Clinical ?Relationship To Patient Self ?Return Phone Number [phone] (Primary) ?Chief Complaint Fever (non-urgent symptom) (greater than THREE ?MONTHS old) ?Reason for Call Symptomatic / Request for Health Information ?Initial Comment Caller was calling in with a fever of 101 and has a ?blister in the back of her throat. ?Translation No ?Nurse Assessment ?Nurse: Mariel Sleet, RN, Erasmo Downer Date/Time (Eastern Time): 09/15/2021 9:32:01 AM ?Confirm and document reason for call. If ?symptomatic, describe symptoms. ?---Caller states she has a sore throat with a blister in ?the back of her throat and fever, body aches. Temp of ?101 this morning. ?Does the patient have any new or worsening ?symptoms? ---Yes ?Will a triage be completed? ---Yes ?Related visit to physician within the last 2 weeks? ---Yes ?Does the PT have any chronic conditions? (i.e. ?diabetes, asthma, this includes High risk factors for ?pregnancy, etc.) ?---No ?Is this a behavioral health or substance abuse call? ---No ?Guidelines ?Guideline Title Affirmed Question Affirmed Notes Nurse Date/Time (Eastern ?Time) ?Sore Throat SEVERE (e.g., ?excruciating) throat ?pain ?Donadeo, RN, ?Erasmo Downer ?09/15/2021 9:33:48 ?AM ?Disp. Time (Eastern ?Time) Disposition Final User ?09/15/2021 9:35:42 AM See PCP within 24 Hours Yes Donadeo, RN, Erasmo Downer ?Caller Disagree/Comply Comply ?PLEASE NOTE: All timestamps contained within this report are represented as [place] Standard  Time. ?CONFIDENTIALTY NOTICE: This fax transmission is intended only for the addressee. It contains information that is legally privileged, confidential or ?otherwise protected from use or disclosure. If you are not the intended recipient, you are strictly prohibited from reviewing, disclosing, copying using ?or disseminating any of this information or taking any action in reliance on or regarding this information. If you have received this fax in error, please ?notify us immediately by telephone so that we can arrange for its return to [place]. Phone: [phone], Toll-Free: [phone], Fax: [phone] ?Page: 2 of 2 ?Call Id: [id number] ?Caller Understands Yes ?PreDisposition Call Doctor ?Care Advice Given Per Guideline ?SEE PCP WITHIN 24 HOURS: * IF OFFICE WILL BE OPEN: You need to be examined within the next 24 hours. Call your doctor ?(or NP/PA) when the office opens and make an appointment. SORE THROAT: * Sip warm chicken broth or apple juice. PAIN AND ?FEVER MEDICINES: * ACETAMINOPHEN - REGULAR STRENGTH TYLENOL: Take 650 mg (two 325 mg pills) by mouth ?every 4 to 6 hours as needed. Each Regular Strength Tylenol pill has 325 mg of acetaminophen. The most you should take is 10 pills ?a day (3,250 mg total). Note: In [place], the maximum is 12 pills a day (3,900 mg total). CALL BACK IF: * You become worse ?CARE ADVICE given per Sore Throat (Adult) guideline. ?Referrals ?REFERRED TO PCP OFFIC ?

## 2021-09-15 NOTE — Telephone Encounter (Signed)
Patient evaluated in office 

## 2021-09-17 LAB — COVID-19, FLU A+B AND RSV
Influenza A, NAA: NOT DETECTED
Influenza B, NAA: NOT DETECTED
RSV, NAA: NOT DETECTED
SARS-CoV-2, NAA: NOT DETECTED

## 2021-09-20 ENCOUNTER — Ambulatory Visit: Payer: BC Managed Care – PPO | Admitting: Dietician

## 2021-09-28 ENCOUNTER — Telehealth: Payer: Self-pay | Admitting: Family Medicine

## 2021-09-28 ENCOUNTER — Ambulatory Visit (INDEPENDENT_AMBULATORY_CARE_PROVIDER_SITE_OTHER): Payer: BC Managed Care – PPO | Admitting: Family Medicine

## 2021-09-28 VITALS — BP 128/82 | HR 70 | Temp 96.9°F | Resp 14 | Ht 62.0 in | Wt 148.3 lb

## 2021-09-28 DIAGNOSIS — R053 Chronic cough: Secondary | ICD-10-CM

## 2021-09-28 MED ORDER — BENZONATATE 200 MG PO CAPS: 200.0000 mg | ORAL_CAPSULE | Freq: Two times a day (BID) | ORAL | 0 refills | Status: DC | PRN

## 2021-09-28 MED ORDER — ALBUTEROL SULFATE HFA 108 (90 BASE) MCG/ACT IN AERS: 2.0000 | INHALATION_SPRAY | Freq: Four times a day (QID) | RESPIRATORY_TRACT | 2 refills | Status: DC | PRN

## 2021-09-28 NOTE — Patient Instructions (Signed)
Likely reactive airway to increase acid and recent infection. ? Use benzonatate as needed for cough and use albuterol inhaler for coughing fits. ? Call if new fever or if symptoms worsening. ? Go to ER for severe shortness of breath. ? ?

## 2021-09-28 NOTE — Telephone Encounter (Signed)
Would recommend re-evaluation, may need CXR.  ? ?If no other appointments with other providers today or tomorrow, I can add her on at 11:40 tomorrow ?

## 2021-09-28 NOTE — Telephone Encounter (Signed)
Pt called stating that she was seen by [facility] on 09/15/21. Pt states that the cough want go away that she is still nasally and chest is real tight. Please advice. ?

## 2021-09-28 NOTE — Telephone Encounter (Signed)
Will send to Dr Einar Pheasant since Catalina Antigua is out of the office this week to review ?

## 2021-09-28 NOTE — Assessment & Plan Note (Signed)
Poorly controlled ? ?Symptoms are most consistent with postinfectious bronchospasm as well as possibly reflux induced bronchospasm and cough. ?Her lung exam is clear. ?Her peak flow is decreased suggesting reactive airway response. ? ?There is no current sign of ongoing infection, viral or bacterial.  Her lung exam was clear. ? ?I recommended treatment with prednisone, but she does not tolerate this medication.  Instead we will use benzonatate as needed and albuterol inhaler for chest tightness and coughing fits. ? ?She is a former light smoker remotely.  There are no red flags or clear indication for chest x-ray. ?

## 2021-09-28 NOTE — Progress Notes (Addendum)
? ? Patient ID: Molly Peterson, female    DOB: 11-15-1965, 56 y.o.   MRN: [id number] ? ?This visit was conducted in person. ? ?BP 128/82   Pulse 70   Temp (!) 96.9 ?F (36.1 ?C)   Resp 14   Ht '5\' 2"'$  (1.575 m)   Wt 148 lb 5 oz (67.3 kg)   SpO2 99%   BMI 27.13 kg/m?   ? ?CC:  ?Chief Complaint  ?Patient presents with  ? Cough  ?  Still lingering since been seen last on 09/15/21- dry cough, gets in coughing spells that make her dizzy, more wheezing, chest tightness. Other ways she is much better. Cough syrup with codeine helps but does make her groggy some so she uses as needed.  ? ? ?Subjective:  ? ?HPI: ?Molly Peterson is a 56 y.o. female presenting on 09/28/2021 for Cough (Still lingering since been seen last on 09/15/21- dry cough, gets in coughing spells that make her dizzy, more wheezing, chest tightness. Other ways she is much better. Cough syrup with codeine helps but does make her groggy some so she uses as needed.) ? ?Patient was seen by Penelope Galas, NP on 09/15/2001.  Note reviewed in detail. ?Symptoms started mid March about 5 weeks ago.  Of note started after beginning Fosamax and having GI side effects, discomfort and globus sensation. ?She was treated with Xifaxan and Protonix and the globus sensation improved.  Also stopped the Fosamax ?3/31 appointment she had new onset fever with 24 hours of mucus and congestion.  Cough was noted to be newly productive although had been going on for the 2 weeks prior. ?Had negative home COVID test, negative in office flu and strep test. ?ACR send out COVID, flu and rsv was negative ?   ?She is at the appointment today given phone message to her PCP stating that cough has not resolved and she has chest tightness. ? ?Today she reports  constant dry cough.. still constant congestion.. mucus is only clear. ? Occ feeling short of breath and wheeze. ? Chest heaviness. ? Occ feeling lightheadedness during coughing fits. ? She denies body aches. ? ? History of sporadic  smoking remotely. ? As a child had asthma, minimal flares in adulthood. ? ? Issue with steroid in past. ? ?Best peak flow today 190. ? ? ?Relevant past medical, surgical, family and social history reviewed and updated as indicated. Interim medical history since our last visit reviewed. ?Allergies and medications reviewed and updated. ?Outpatient Medications Prior to Visit  ?Medication Sig Dispense Refill  ? ALPRAZolam (XANAX) 0.25 MG tablet Take 1 tablet (0.25 mg total) by mouth daily as needed for anxiety. 15 tablet 0  ? Loteprednol Etabonate (EYSUVIS) 0.25 % SUSP Apply to eye.    ? metoprolol succinate (TOPROL-XL) 50 MG 24 hr tablet Take 1 tablet (50 mg total) by mouth daily. TAKE 1 TABLET BY MOUTH DAILY WITH OR IMMEDIATLEY FOLLOWING A MEAL Strength: 50 mg 90 tablet 0  ? pantoprazole (PROTONIX) 20 MG tablet Take 1 tablet (20 mg total) by mouth daily. 30 tablet 0  ? rosuvastatin (CRESTOR) 10 MG tablet Take 1 tablet (10 mg total) by mouth every other day. 90 tablet 3  ? famotidine (PEPCID) 20 MG tablet Take 1 tablet (20 mg total) by mouth 2 (two) times daily for 7 days. Twice daily for one week then twice daily PRN reflux 30 tablet 0  ? ?No facility-administered medications prior to visit.  ?  ? ?Per HPI  unless specifically indicated in ROS section below ?Review of Systems  ?Constitutional:  Negative for fatigue and fever.  ?HENT:  Negative for congestion.   ?Eyes:  Negative for pain.  ?Respiratory:  Positive for cough and chest tightness. Negative for shortness of breath.   ?Cardiovascular:  Negative for chest pain, palpitations and leg swelling.  ?Gastrointestinal:  Negative for abdominal pain.  ?Genitourinary:  Negative for dysuria and vaginal bleeding.  ?Musculoskeletal:  Negative for back pain.  ?Neurological:  Negative for syncope, light-headedness and headaches.  ?Psychiatric/Behavioral:  Negative for dysphoric mood.   ?Objective:  ?BP 128/82   Pulse 70   Temp (!) 96.9 ?F (36.1 ?C)   Resp 14   Ht '5\' 2"'$   (1.575 m)   Wt 148 lb 5 oz (67.3 kg)   SpO2 99%   BMI 27.13 kg/m?   ?Wt Readings from Last 3 Encounters:  ?09/28/21 148 lb 5 oz (67.3 kg)  ?09/15/21 146 lb (66.2 kg)  ?09/05/21 148 lb 3.2 oz (67.2 kg)  ?  ?  ?Physical Exam ?Constitutional:   ?   General: She is not in acute distress. ?   Appearance: Normal appearance. She is well-developed. She is not ill-appearing or toxic-appearing.  ?   Comments: Constant dry  coughing  ?HENT:  ?   Head: Normocephalic.  ?   Right Ear: Hearing, tympanic membrane, ear canal and external ear normal. Tympanic membrane is not erythematous, retracted or bulging.  ?   Left Ear: Hearing, tympanic membrane, ear canal and external ear normal. Tympanic membrane is not erythematous, retracted or bulging.  ?   Nose: No mucosal edema or rhinorrhea.  ?   Right Sinus: No maxillary sinus tenderness or frontal sinus tenderness.  ?   Left Sinus: No maxillary sinus tenderness or frontal sinus tenderness.  ?   Mouth/Throat:  ?   Pharynx: Uvula midline.  ?Eyes:  ?   General: Lids are normal. Lids are everted, no foreign bodies appreciated.  ?   Conjunctiva/sclera: Conjunctivae normal.  ?   Pupils: Pupils are equal, round, and reactive to light.  ?Neck:  ?   Thyroid: No thyroid mass or thyromegaly.  ?   Vascular: No carotid bruit.  ?   Trachea: Trachea normal.  ?Cardiovascular:  ?   Rate and Rhythm: Normal rate and regular rhythm.  ?   Pulses: Normal pulses.  ?   Heart sounds: Normal heart sounds, S1 normal and S2 normal. No murmur heard. ?  No friction rub. No gallop.  ?Pulmonary:  ?   Effort: Pulmonary effort is normal. No tachypnea or respiratory distress.  ?   Breath sounds: Normal breath sounds. No decreased breath sounds, wheezing, rhonchi or rales.  ?Abdominal:  ?   General: Bowel sounds are normal.  ?   Palpations: Abdomen is soft.  ?   Tenderness: There is no abdominal tenderness.  ?Musculoskeletal:  ?   Cervical back: Normal range of motion and neck supple.  ?Skin: ?   General: Skin is  warm and dry.  ?   Findings: No rash.  ?Neurological:  ?   Mental Status: She is alert.  ?Psychiatric:     ?   Mood and Affect: Mood is not anxious or depressed.     ?   Speech: Speech normal.     ?   Behavior: Behavior normal. Behavior is cooperative.     ?   Thought Content: Thought content normal.     ?   Judgment: Judgment  normal.  ? ?   ?Results for orders placed or performed in visit on 09/15/21  ?COVID-19, Flu A+B and RSV  ? Specimen: Nasopharyngeal(NP) swabs in vial transport medium  ? Nasopharynge  Previous  ?Result Value Ref Range  ? SARS-CoV-2, NAA Not Detected Not Detected  ? Influenza A, NAA Not Detected Not Detected  ? Influenza B, NAA Not Detected Not Detected  ? RSV, NAA Not Detected Not Detected  ? Test Information: Comment   ?Rapid Strep A  ?Result Value Ref Range  ? Rapid Strep A Screen Negative Negative  ?POCT Influenza A/B  ?Result Value Ref Range  ? Influenza A, POC Negative Negative  ? Influenza B, POC Negative Negative  ? ? ?This visit occurred during the SARS-CoV-2 public health emergency.  Safety protocols were in place, including screening questions prior to the visit, additional usage of staff PPE, and extensive cleaning of exam room while observing appropriate contact time as indicated for disinfecting solutions.  ? ?COVID 19 screen:  No recent travel or known exposure to [place] ?The patient denies respiratory symptoms of COVID 19 at this time. ?The importance of social distancing was discussed today.  ? ?Assessment and Plan ? ?  ?Problem List Items Addressed This Visit   ? ? Persistent cough for 3 weeks or longer - Primary  ?  Poorly controlled ? ?Symptoms are most consistent with postinfectious bronchospasm as well as possibly reflux induced bronchospasm and cough. ?Her lung exam is clear. ?Her peak flow is decreased suggesting reactive airway response. ? ?There is no current sign of ongoing infection, viral or bacterial.  Her lung exam was clear. ? ?I recommended treatment with  prednisone, but she does not tolerate this medication.  Instead we will use benzonatate as needed and albuterol inhaler for chest tightness and coughing fits. ? ?She is a former light smoker remotely.  There are no red flag

## 2021-09-28 NOTE — Telephone Encounter (Signed)
Patient advised. Dr Diona Browner worked patient in today at 11:40 am ?

## 2021-10-12 ENCOUNTER — Encounter: Payer: Self-pay | Admitting: Dietician

## 2021-10-12 NOTE — Progress Notes (Signed)
Patient cancelled her follow up MNT visit on 09/20/21 and has not yet rescheduled. Sent notification to referring provider. ?

## 2021-10-19 ENCOUNTER — Other Ambulatory Visit: Payer: Self-pay | Admitting: Internal Medicine

## 2021-10-19 ENCOUNTER — Other Ambulatory Visit: Payer: Self-pay | Admitting: Nurse Practitioner

## 2021-10-19 DIAGNOSIS — K2100 Gastro-esophageal reflux disease with esophagitis, without bleeding: Secondary | ICD-10-CM

## 2021-10-19 NOTE — Telephone Encounter (Signed)
Pt's medication was sent to pt's pharmacy as requested. Confirmation received.  °

## 2021-11-16 DIAGNOSIS — R7309 Other abnormal glucose: Secondary | ICD-10-CM | POA: Diagnosis not present

## 2021-11-28 ENCOUNTER — Other Ambulatory Visit: Payer: Self-pay | Admitting: Obstetrics and Gynecology

## 2021-11-28 DIAGNOSIS — Z1231 Encounter for screening mammogram for malignant neoplasm of breast: Secondary | ICD-10-CM

## 2021-12-01 ENCOUNTER — Ambulatory Visit
Admission: RE | Admit: 2021-12-01 | Discharge: 2021-12-01 | Disposition: A | Discharge status: Home / Self Care | Payer: BC Managed Care – PPO | Source: Ambulatory Visit | Attending: Obstetrics and Gynecology | Admitting: Obstetrics and Gynecology

## 2021-12-01 DIAGNOSIS — Z1231 Encounter for screening mammogram for malignant neoplasm of breast: Secondary | ICD-10-CM | POA: Diagnosis not present

## 2021-12-04 ENCOUNTER — Telehealth: Payer: Self-pay | Admitting: Internal Medicine

## 2021-12-04 NOTE — Telephone Encounter (Signed)
Patient called requesting Dr. Harrington Challenger place a referral for Molly Peterson with [facility]. Advised pt based on [place] she will be back in office on Thursday. Patient stated she is not willing to wait that long and will go through her PCP.

## 2021-12-04 NOTE — Telephone Encounter (Signed)
Patient looking for a referral to GI. She is going to talk to her PCP.

## 2021-12-05 ENCOUNTER — Telehealth: Payer: Self-pay

## 2021-12-05 DIAGNOSIS — Z86010 Personal history of colonic polyps: Secondary | ICD-10-CM

## 2021-12-05 DIAGNOSIS — R1084 Generalized abdominal pain: Secondary | ICD-10-CM

## 2021-12-05 DIAGNOSIS — R143 Flatulence: Secondary | ICD-10-CM

## 2021-12-05 NOTE — Telephone Encounter (Signed)
Mychart to pt letting her know about new referral placed

## 2021-12-05 NOTE — Telephone Encounter (Signed)
Referral placed.

## 2021-12-05 NOTE — Telephone Encounter (Signed)
Patient is calling in stating she is needing a referral to a new GI doctor as Dr.Stark is very hard to get in with and does not find him to reliable. Wanting to know if we can send the referral to [place] with [facility]. States she has discussed this with [place] but wasn't ready for the switch just yet.

## 2021-12-16 DIAGNOSIS — R7309 Other abnormal glucose: Secondary | ICD-10-CM | POA: Diagnosis not present

## 2021-12-31 ENCOUNTER — Telehealth: Payer: Self-pay | Admitting: Student

## 2021-12-31 NOTE — Telephone Encounter (Signed)
    The patient called the after hours line reporting left-sided shoulder blade pain this morning. Has been constant and is worse with positional changes. No association with exertion. Slightly worse with deep breathing. Says she was moving boxes yesterday and lifting objects about her head. Reviewed her pain seems most consistent with MSK pain from her activities yesterday. Recommended she apply ice or heat to the area and she will take Aleve to see if this helps. Reviewed her prior cardiac work up was reassuring with a calcium score of 0 and normal stress test last year. We reviewed warning signs to monitor for which would warrant emergent evaluation. She voiced understanding of this and was appreciative of the return call.   Signed, Erma Heritage, PA-C 12/31/2021, 9:45 AM

## 2022-01-02 NOTE — Telephone Encounter (Signed)
Called pt   Symptoms have improved    Sound more musculoskeletal in origin

## 2022-01-03 ENCOUNTER — Telehealth: Payer: Self-pay | Admitting: Physician Assistant

## 2022-01-03 NOTE — Telephone Encounter (Signed)
Pt was notified that Dr. Fuller Plan has availability tomorrow at 10:30 AM: Pt stated that she could make that happen: Pt was scheduled to see Dr. Fuller Plan on 01/04/2022 at 10:30 AM: Pt made aware Pt verbalized understanding with all questions answered.

## 2022-01-03 NOTE — Telephone Encounter (Signed)
Inbound call from patient stating she wanted to make an appointment. Patient stated that she wanted to make an appointment with Dr. Fuller Plan because she is tried of being pushed around and getting medications that her not helping her issues. Patient stated that she had her gallbladder taken out not because it was given her issues but because they found polyps on it and had it removed. Patient stated that since that she has been having issues with her stomach. I advised patient that Dr. Silvio Pate next appointment was 9/14, patient stated " well I guess ill have to take it". I advised patient I could send a message to the nurse, and she said " and what is the reason for that, what are they going to do, nothing". I told her that I could send a message and see if there was any recommendations in thew meantime. Patient agreed. Please advise.

## 2022-01-03 NOTE — Telephone Encounter (Signed)
Do we have a cancelation list?  Can we send this note to Dr. Fuller Plan and me again so I can remember to talk with him about her.  She is a difficult patient and needs other OV for certain....with him.  Thanks!

## 2022-01-04 ENCOUNTER — Encounter: Payer: Self-pay | Admitting: Gastroenterology

## 2022-01-04 ENCOUNTER — Ambulatory Visit (INDEPENDENT_AMBULATORY_CARE_PROVIDER_SITE_OTHER): Payer: BC Managed Care – PPO | Admitting: Gastroenterology

## 2022-01-04 VITALS — BP 130/70 | HR 68 | Ht 62.0 in | Wt 145.4 lb

## 2022-01-04 DIAGNOSIS — R198 Other specified symptoms and signs involving the digestive system and abdomen: Secondary | ICD-10-CM

## 2022-01-04 DIAGNOSIS — R1030 Lower abdominal pain, unspecified: Secondary | ICD-10-CM

## 2022-01-04 NOTE — Patient Instructions (Signed)
If you are age 56 or older, your body mass index should be between 23-30. Your Body mass index is 26.59 kg/m. If this is out of the aforementioned range listed, please consider follow up with your Primary Care Provider.  If you are age 81 or younger, your body mass index should be between 19-25. Your Body mass index is 26.59 kg/m. If this is out of the aformentioned range listed, please consider follow up with your Primary Care Provider.   ________________________________________________________  The [facility] providers would like to encourage you to use [facility] to communicate with providers for non-urgent requests or questions.  Due to long hold times on the telephone, sending your provider a message by [facility] may be a faster and more efficient way to get a response.  Please allow 48 business hours for a response.  Please remember that this is for non-urgent requests.  _______________________________________________________   Take over the counter Miralax starting with 1/3 scoop and you can titrate up to 1 scoop daily.   Call if your symptoms has not improved in 6 weeks.  Thank you for entrusting me with your care and choosing [facility].  Dr. Fuller Plan

## 2022-01-04 NOTE — Progress Notes (Signed)
Assessment     Constipation with loose stools, incomplete fecal evacuation Mild colon diverticulosis S/P cholecystectomy Personal history of sessile serrated adenoma and a tubular adenoma   Recommendations    Attempt to achieve complete daily fecal evacuation.  Titrate dose of MiraLAX from 1/3 capful to 1 capful daily.  Adjust timing of dosage during the day per preference.  She is advised to contact us if she does not obtain adequate results within the next 6 weeks. Surveillance colonoscopy recommended in January 2030   HPI    This is a 56 year old female with a history of irregular bowel habits with several looser stools in the morning, recurrent lower abdominal pain, abdominal bloating for several years. She complains of loose stools with oily deposits since her cholecystectomy. Fecal elastase in March was normal.  Full cap of MiraLAX daily led to looser and more frequent stools which was unhelpful.  Dicyclomine was unhelpful.  Fiber supplements were unhelpful.  She underwent CT AP in January 2022 that showed mild colonic diverticulosis and aortic atherosclerosis.  She underwent CTA of the chest abdomen and pelvis in May 2022 showing stranding in the region of the hysterectomy bed on the right of unclear etiology otherwise negative.  1 view abdominal film in February 2023 showed a large amount of stool present in the right colon and transverse colon.  Bowel prep at her colonoscopy in January 2023 was adequate after extensive lavage and suction.  She relates she completed the entire prep. CBC, CMP, TSH, ESR, CRP in December 2022 were normal.  She states she has gained about 10 pounds over the past several months.  Her appetite is good.  She has several dietary intolerances and she avoids these foods and beverages.   Colonoscopy Jan 2023 - External hemorrhoids found on perianal exam. - Two 7 to 8 mm polyps in the ascending colon, removed with a cold snare. Resected and retrieved. - Mild  left colon diverticulosis. - Internal hemorrhoids. - The examination was otherwise normal on direct and retroflexion views.   Labs / Imaging       Latest Ref Rng & Units 06/14/2021    9:41 AM 10/22/2020    5:26 PM 06/08/2020    1:40 PM  Hepatic Function  Total Protein 6.0 - 8.5 g/dL 6.5  7.0  6.7   Albumin 3.8 - 4.9 g/dL 4.4  4.3  4.5   AST 0 - 40 IU/L _0 ALT 0 - 32 IU/L _1 Alk Phosphatase 44 - 121 IU/L 57  54  59   Total Bilirubin 0.0 - 1.2 mg/dL 0.3  0.5  0.4        Latest Ref Rng & Units 08/14/2021    2:54 PM 06/14/2021    9:41 AM 10/22/2020    5:26 PM  CBC  WBC 3.4 - 10.8 x10E3/uL 6.4  5.3  10.1   Hemoglobin 11.1 - 15.9 g/dL 13.5  13.9  14.0   Hematocrit 34.0 - 46.6 % 41.0  41.9  42.5   Platelets 150 - 450 x10E3/uL 220  198  229      Current Medications, Allergies, Past Medical History, Past Surgical History, Family History and Social History were reviewed in [employer] record.   Physical Exam: General: Well developed, well nourished, no acute distress Head: Normocephalic and atraumatic Eyes: Sclerae anicteric, EOMI Ears: Normal auditory acuity Mouth: Not examined Lungs: Clear throughout to  auscultation Heart: Regular rate and rhythm; no murmurs, rubs or bruits Abdomen: Soft, mild generalized tenderness to light palpation lower abdomen > upper abdomen and non distended. No masses, hepatosplenomegaly or hernias noted. Normal Bowel sounds Rectal: Not done Musculoskeletal: Symmetrical with no gross deformities  Pulses:  Normal pulses noted Extremities: No clubbing, cyanosis, edema or deformities noted Neurological: Alert oriented x 4, grossly nonfocal Psychological:  Alert and cooperative. Normal mood and affect   Talen Poser T. Fuller Plan, MD 01/04/2022, 10:29 AM

## 2022-01-16 DIAGNOSIS — R7309 Other abnormal glucose: Secondary | ICD-10-CM | POA: Diagnosis not present

## 2022-01-26 ENCOUNTER — Telehealth: Payer: Self-pay | Admitting: Family Medicine

## 2022-01-26 ENCOUNTER — Ambulatory Visit
Admission: RE | Admit: 2022-01-26 | Discharge: 2022-01-26 | Disposition: A | Discharge status: Home / Self Care | Payer: BC Managed Care – PPO | Source: Ambulatory Visit | Attending: Family Medicine | Admitting: Family Medicine

## 2022-01-26 VITALS — BP 112/79 | HR 91 | Temp 98.6°F | Resp 18

## 2022-01-26 DIAGNOSIS — U071 COVID-19: Secondary | ICD-10-CM

## 2022-01-26 NOTE — Discharge Instructions (Addendum)
Take Tylenol or ibuprofen as needed for fever or discomfort.  Rest and keep yourself hydrated.  Follow-up with your primary care provider.

## 2022-01-26 NOTE — ED Provider Notes (Signed)
Roderic Palau    CSN: [id number] Arrival date & time: 01/26/22  1532      History   Chief Complaint Chief Complaint  Patient presents with   Fever    body aches, last night tight in chest but not today.01/24/22 + covid - Entered by patient    HPI Molly Peterson is a 56 y.o. female.  Patient presents with fever, body aches, fatigue, and headache; Symptom onset and tested COVID positive at home on 01/24/2022.  Tmax 102.  Treatment at home with Tylenol.  No rash, sore throat, cough, shortness of breath, vomiting, diarrhea, or other symptoms.  Her medical history includes hypertension, hyperlipidemia, CKD, chronic back pain, B12 deficiency anemia, anxiety, depression.   The history is provided by the patient and medical records.    Past Medical History:  Diagnosis Date   Acute pharyngitis    ANXIETY 09/18/2006   on PRN meds for situational anxiety   Chronic kidney disease    hx kidney stones   DEPRESSION 09/18/2006   DERMATITIS, SEBORRHEIC NEC 01/06/2007   Edema    symptom   Flatulence, eructation, and gas pain    Hyperlipidemia    on meds   HYPERTENSION 09/18/2006   on meds   Osteopenia    Palpitations 09/18/2006   REACTION, ACUTE STRESS W/EMOTIONAL DSTURB 12/23/2006   Seasonal allergies    Unspecified constipation    Urinary tract infection, site not specified     Patient Active Problem List   Diagnosis Date Noted   Sore throat 09/15/2021   Persistent cough for 3 weeks or longer 09/15/2021   Fever 09/15/2021   Generalized pain 07/12/2021   Gynecomastia 07/12/2021   Weight gain, abnormal 06/14/2021   Leukocytosis 06/14/2021   Elevated bilirubin 06/14/2021   Anemia due to vitamin B12 deficiency 06/14/2021   Rash and nonspecific skin eruption 06/14/2021   Polyarthralgia 06/14/2021   Aortic atherosclerosis ([place]) 10/25/2020   Flank pain 10/25/2020   S/P hysterectomy 10/25/2020   Pes anserinus bursitis of right knee 09/14/2020   Memory changes  04/14/2019   Bilateral arm numbness and tingling while sleeping 08/25/2018   Chronic back pain 08/25/2018   Abnormal auditory perception of both ears 01/31/2016   TMJ pain dysfunction syndrome 01/31/2016   Essential hypertension 01/31/2016   Hyperlipidemia 12/19/2011   CONSTIPATION 10/11/2008   FLATULENCE-GAS-BLOATING 10/11/2008   ABDOMINAL PAIN -GENERALIZED 10/11/2008   DERMATITIS, SEBORRHEIC NEC 01/06/2007   REACTION, ACUTE STRESS W/EMOTIONAL DSTURB 12/23/2006   SYMPTOM, EDEMA 12/23/2006   Anxiety 09/18/2006   DEPRESSION 09/18/2006   PALPITATIONS 09/18/2006    Past Surgical History:  Procedure Laterality Date   ABDOMINAL HYSTERECTOMY     partial-still has ovaries   APPENDECTOMY  unknown   CESAREAN SECTION     x 2   CHOLECYSTECTOMY     COLONOSCOPY  2017   MS-MAC-goor prep-int/ext hem/SSP/adenoma-5 yr recall   CYSTOSCOPY  2020   POLYPECTOMY  2017   SSP/adenoma   TONSILLECTOMY  unknown    OB History   No obstetric history on file.      Home Medications    Prior to Admission medications   Medication Sig Start Date End Date Taking? Authorizing Provider  albuterol (VENTOLIN HFA) 108 (90 Base) MCG/ACT inhaler Inhale 2 puffs into the lungs every 6 (six) hours as needed for wheezing or shortness of breath. Patient not taking: Reported on 01/04/2022 09/28/21   Jinny Sanders, MD  ALPRAZolam Duanne Moron) 0.25 MG tablet Take 1 tablet (0.25  mg total) by mouth daily as needed for anxiety. 03/23/21   [name], MD  calcium carbonate (CALCIUM 600) 600 MG TABS tablet Take 600 mg by mouth daily with breakfast.    [provider]  Loteprednol Etabonate (EYSUVIS) 0.25 % SUSP Apply to eye.    [provider]  metoprolol succinate (TOPROL-XL) 50 MG 24 hr tablet TAKE ONE TABLET BY MOUTH EVERY DAY IMMEDIATELY FOLLOWING A MEAL 10/19/21   Fay Records, MD  Multiple Vitamins-Minerals (CENTRUM SILVER PO) Take 1 tablet by mouth daily.    [provider]  rosuvastatin  (CRESTOR) 10 MG tablet Take 1 tablet (10 mg total) by mouth every other day. 09/05/21   Fay Records, MD    Family History Family History  Problem Relation Age of Onset   Colon polyps Mother    Heart disease Mother        PVC _0  17   Atrial fibrillation Mother 1   Colon polyps Father    Heart disease Father        MI _1  2 , CABG _2    Hypertension Father    Hyperlipidemia Father    Hearing loss Father    Heart attack Father 69   Asthma Father    Kidney cancer Father    Dementia Maternal Grandmother    Heart failure Maternal Grandfather    Heart attack Maternal Grandfather    Stroke Paternal Grandmother 60   Heart attack Paternal Grandfather        young   Depression Son    Colon cancer Neg Hx    Esophageal cancer Neg Hx    Stomach cancer Neg Hx    Pancreatic cancer Neg Hx    Rectal cancer Neg Hx     Social History Social History   Tobacco Use   Smoking status: Former    Packs/day: 0.25    Years: 16.00    Total pack years: 4.00    Types: Cigarettes    Quit date: 06/18/2004    Years since quitting: 17.6   Smokeless tobacco: Never  Vaping Use   Vaping Use: Never used  Substance Use Topics   Alcohol use: Not Currently    Comment: occasionally-no specific pattern, rare   Drug use: No     Allergies   Other, Sulfa antibiotics, and Latex   Review of Systems Review of Systems  Constitutional:  Positive for fatigue and fever. Negative for chills.  HENT:  Negative for ear pain and sore throat.   Respiratory:  Negative for cough and shortness of breath.   Cardiovascular:  Negative for chest pain and palpitations.  Gastrointestinal:  Negative for diarrhea and vomiting.  Skin:  Negative for color change and rash.  Neurological:  Positive for headaches.  All other systems reviewed and are negative.    Physical Exam Triage Vital Signs ED Triage Vitals  Enc Vitals Group     BP      Pulse      Resp      Temp      Temp src      SpO2      Weight       Height      Head Circumference      Peak Flow      Pain Score      Pain Loc      Pain Edu?      Excl. in [place]?    No data found.  Updated Vital Signs  BP 112/79   Pulse 91   Temp 98.6 F (37 C)   Resp 18   SpO2 97%   Visual Acuity Right Eye Distance:   Left Eye Distance:   Bilateral Distance:    Right Eye Near:   Left Eye Near:    Bilateral Near:     Physical Exam Vitals and nursing note reviewed.  Constitutional:      General: She is not in acute distress.    Appearance: Normal appearance. She is well-developed. She is not ill-appearing.  HENT:     Right Ear: Tympanic membrane normal.     Left Ear: Tympanic membrane normal.     Nose: Nose normal.     Mouth/Throat:     Mouth: Mucous membranes are moist.     Pharynx: Oropharynx is clear.  Cardiovascular:     Rate and Rhythm: Normal rate and regular rhythm.     Heart sounds: Normal heart sounds.  Pulmonary:     Effort: Pulmonary effort is normal. No respiratory distress.     Breath sounds: Normal breath sounds.  Musculoskeletal:     Cervical back: Neck supple.  Skin:    General: Skin is warm and dry.  Neurological:     Mental Status: She is alert.  Psychiatric:        Mood and Affect: Mood normal.        Behavior: Behavior normal.      UC Treatments / Results  Labs (all labs ordered are listed, but only abnormal results are displayed) Labs Reviewed - No data to display  EKG   Radiology No results found.  Procedures Procedures (including critical care time)  Medications Ordered in UC Medications - No data to display  Initial Impression / Assessment and Plan / UC Course  I have reviewed the triage vital signs and the nursing notes.  Pertinent labs & imaging results that were available during my care of the patient were reviewed by me and considered in my medical decision making (see chart for details).    COVID-19.  Patient declines antiviral COVID medication.  Discussed symptomatic treatment  including Tylenol or ibuprofen, rest, hydration.  ED precautions discussed.  Instructed patient to follow up with her PCP if her symptoms are not improving.  She agrees to plan of care.    Final Clinical Impressions(s) / UC Diagnoses   Final diagnoses:  [id number]     Discharge Instructions      Take Tylenol or ibuprofen as needed for fever or discomfort.  Rest and keep yourself hydrated.  Follow-up with your primary care provider.     ED Prescriptions   None    PDMP not reviewed this encounter.   Sharion Balloon, NP 01/26/22 1609

## 2022-01-26 NOTE — ED Triage Notes (Signed)
Patient presents to Urgent Care with complaints of fever, body aches, HA, and fatigue since Weds. Pt states she tested covid positive 08/09. Treating symptoms with Tylenol.  Denies SOB.

## 2022-01-26 NOTE — Telephone Encounter (Signed)
I spoke with pt; pt said symptoms really started on 01/24/22; pt has had body aches, now temp 101 but has just taken two tylenol; not really coughing but had scratchy throat. NO CP or SOB but last night pt had tightness in chest and BP elevated 160/102 P 92.pt did not rest due to body aches. Pt took BP this morning 114/76 P 92. Pt tested positive for covid on 01/24/22. No available appts at [facility] or [place] and pt scheduled appt at [place] 01/26/22 at 3:30 with UC & ED precautions and pt voiced understanding. Pt will wear a mask.Sending note to Dr Einar Pheasant and [facility] CMA.

## 2022-01-26 NOTE — Telephone Encounter (Signed)
Agree with plan 

## 2022-02-16 DIAGNOSIS — R7309 Other abnormal glucose: Secondary | ICD-10-CM | POA: Diagnosis not present

## 2022-02-17 ENCOUNTER — Ambulatory Visit: Admit: 2022-02-17 | Discharge status: Absent without leave | Payer: BC Managed Care – PPO

## 2022-02-17 ENCOUNTER — Ambulatory Visit
Admission: EM | Admit: 2022-02-17 | Discharge: 2022-02-17 | Disposition: A | Discharge status: Home / Self Care | Payer: BC Managed Care – PPO | Attending: Family Medicine | Admitting: Family Medicine

## 2022-02-17 DIAGNOSIS — H04201 Unspecified epiphora, right lacrimal gland: Secondary | ICD-10-CM | POA: Diagnosis not present

## 2022-02-17 DIAGNOSIS — H01001 Unspecified blepharitis right upper eyelid: Secondary | ICD-10-CM

## 2022-02-17 DIAGNOSIS — H1031 Unspecified acute conjunctivitis, right eye: Secondary | ICD-10-CM

## 2022-02-17 MED ORDER — OFLOXACIN 0.3 % OP SOLN: 1.0000 [drp] | Freq: Three times a day (TID) | OPHTHALMIC | 0 refills | Status: AC

## 2022-02-17 MED ORDER — DOXYCYCLINE HYCLATE 100 MG PO CAPS: 100.0000 mg | ORAL_CAPSULE | Freq: Two times a day (BID) | ORAL | 0 refills | Status: AC

## 2022-02-17 NOTE — Discharge Instructions (Addendum)
Follow-up with Dr. Purvis Sheffield office on Tuesday for evaluation to ensure tear ducts are not blocked. Take medication as prescribed.  You can use the tetracaine 3 times daily as needed for pain. If any of your symptoms worsen or you experience any severe changes in visual acuity go immediately to the emergency department.

## 2022-02-17 NOTE — ED Provider Notes (Signed)
[facility]    CSN: [id number] Arrival date & time: 02/17/22  1432      History   Chief Complaint Chief Complaint  Patient presents with   Eye Problem    HPI ARCELIA PALS is a 56 y.o. female.   HPI Patient presents for evaluation of bilateral eye burning, pain with new redness and right eye lid (underneath eye lid)  tenderness developing overnight. She reports a couple years ago developing a large hordeolum on her eye which took an extended period time to resolve. She endorses that her vision is slightly blurry related to recurrent tearing, she feels the sensation that something under the right eye lid. Denies any crusting or purulent drainage from either eye.  Past Medical History:  Diagnosis Date   Acute pharyngitis    ANXIETY 09/18/2006   on PRN meds for situational anxiety   Chronic kidney disease    hx kidney stones   DEPRESSION 09/18/2006   DERMATITIS, SEBORRHEIC NEC 01/06/2007   Edema    symptom   Flatulence, eructation, and gas pain    Hyperlipidemia    on meds   HYPERTENSION 09/18/2006   on meds   Osteopenia    Palpitations 09/18/2006   REACTION, ACUTE STRESS W/EMOTIONAL DSTURB 12/23/2006   Seasonal allergies    Unspecified constipation    Urinary tract infection, site not specified     Patient Active Problem List   Diagnosis Date Noted   Sore throat 09/15/2021   Persistent cough for 3 weeks or longer 09/15/2021   Fever 09/15/2021   Generalized pain 07/12/2021   Gynecomastia 07/12/2021   Weight gain, abnormal 06/14/2021   Leukocytosis 06/14/2021   Elevated bilirubin 06/14/2021   Anemia due to vitamin B12 deficiency 06/14/2021   Rash and nonspecific skin eruption 06/14/2021   Polyarthralgia 06/14/2021   Aortic atherosclerosis ([place]) 10/25/2020   Flank pain 10/25/2020   S/P hysterectomy 10/25/2020   Pes anserinus bursitis of right knee 09/14/2020   Memory changes 04/14/2019   Bilateral arm numbness and tingling while sleeping  08/25/2018   Chronic back pain 08/25/2018   Abnormal auditory perception of both ears 01/31/2016   TMJ pain dysfunction syndrome 01/31/2016   Essential hypertension 01/31/2016   Hyperlipidemia 12/19/2011   CONSTIPATION 10/11/2008   FLATULENCE-GAS-BLOATING 10/11/2008   ABDOMINAL PAIN -GENERALIZED 10/11/2008   DERMATITIS, SEBORRHEIC NEC 01/06/2007   REACTION, ACUTE STRESS W/EMOTIONAL DSTURB 12/23/2006   SYMPTOM, EDEMA 12/23/2006   Anxiety 09/18/2006   DEPRESSION 09/18/2006   PALPITATIONS 09/18/2006    Past Surgical History:  Procedure Laterality Date   ABDOMINAL HYSTERECTOMY     partial-still has ovaries   APPENDECTOMY  unknown   CESAREAN SECTION     x 2   CHOLECYSTECTOMY     COLONOSCOPY  2017   MS-MAC-goor prep-int/ext hem/SSP/adenoma-5 yr recall   CYSTOSCOPY  2020   POLYPECTOMY  2017   SSP/adenoma   TONSILLECTOMY  unknown    OB History   No obstetric history on file.      Home Medications    Prior to Admission medications   Medication Sig Start Date End Date Taking? Authorizing Provider  doxycycline (VIBRAMYCIN) 100 MG capsule Take 1 capsule (100 mg total) by mouth 2 (two) times daily for 7 days. 02/17/22 02/24/22 Yes Scot Jun, FNP  ofloxacin (OCUFLOX) 0.3 % ophthalmic solution Place 1 drop into the right eye in the morning, at noon, and at bedtime for 5 days. 02/17/22 02/22/22 Yes Scot Jun, FNP  albuterol (  VENTOLIN HFA) 108 (90 Base) MCG/ACT inhaler Inhale 2 puffs into the lungs every 6 (six) hours as needed for wheezing or shortness of breath. Patient not taking: Reported on 01/04/2022 09/28/21   Jinny Sanders, MD  ALPRAZolam Duanne Moron) 0.25 MG tablet Take 1 tablet (0.25 mg total) by mouth daily as needed for anxiety. 03/23/21   [name], MD  calcium carbonate (CALCIUM 600) 600 MG TABS tablet Take 600 mg by mouth daily with breakfast.    [provider]  Loteprednol Etabonate (EYSUVIS) 0.25 % SUSP Apply to eye.    [provider]   metoprolol succinate (TOPROL-XL) 50 MG 24 hr tablet TAKE ONE TABLET BY MOUTH EVERY DAY IMMEDIATELY FOLLOWING A MEAL 10/19/21   Fay Records, MD  Multiple Vitamins-Minerals (CENTRUM SILVER PO) Take 1 tablet by mouth daily.    [provider]  rosuvastatin (CRESTOR) 10 MG tablet Take 1 tablet (10 mg total) by mouth every other day. 09/05/21   Fay Records, MD    Family History Family History  Problem Relation Age of Onset   Colon polyps Mother    Heart disease Mother        PVC _0  40   Atrial fibrillation Mother 76   Colon polyps Father    Heart disease Father        MI _1  55 , CABG _2    Hypertension Father    Hyperlipidemia Father    Hearing loss Father    Heart attack Father 66   Asthma Father    Kidney cancer Father    Dementia Maternal Grandmother    Heart failure Maternal Grandfather    Heart attack Maternal Grandfather    Stroke Paternal Grandmother 69   Heart attack Paternal Grandfather        young   Depression Son    Colon cancer Neg Hx    Esophageal cancer Neg Hx    Stomach cancer Neg Hx    Pancreatic cancer Neg Hx    Rectal cancer Neg Hx     Social History Social History   Tobacco Use   Smoking status: Former    Packs/day: 0.25    Years: 16.00    Total pack years: 4.00    Types: Cigarettes    Quit date: 06/18/2004    Years since quitting: 17.6   Smokeless tobacco: Never  Vaping Use   Vaping Use: Never used  Substance Use Topics   Alcohol use: Not Currently    Comment: occasionally-no specific pattern, rare   Drug use: No     Allergies   Other, Sulfa antibiotics, and Latex   Review of Systems Review of Systems Pertinent negatives listed in HPI  Physical Exam Triage Vital Signs ED Triage Vitals [02/17/22 1514]  Enc Vitals Group     BP      Pulse      Resp      Temp      Temp src      SpO2      Weight      Height      Head Circumference      Peak Flow      Pain Score 4     Pain Loc      Pain Edu?      Excl. in [place]?     No data found.  Updated Vital Signs Pulse 94   Temp 97.9 F (36.6 C) (Temporal)   Resp 16   SpO2 98%  Visual Acuity Right Eye Distance: 20/20 Left Eye Distance: 20/50 Bilateral Distance: 20/20 (corrective lenses)  Right Eye Near:   Left Eye Near:    Bilateral Near:     Physical Exam HENT:     Head: Normocephalic and atraumatic.     Right Ear: External ear normal.     Left Ear: Tympanic membrane and external ear normal.  Eyes:     General:        Right eye: Discharge present. No foreign body.        Left eye: No foreign body.     Extraocular Movements: Extraocular movements intact.     Right eye: Normal extraocular motion.     Left eye: Normal extraocular motion.     Conjunctiva/sclera:     Right eye: Hemorrhage present. No exudate.    Left eye: Left conjunctiva is not injected. No chemosis, exudate or hemorrhage.    Pupils:     Right eye: No corneal abrasion or fluorescein uptake.     Comments: Patient unable to tolerated everting right eye lid. Superficial tenderness elicited in mid eyelid  Cardiovascular:     Rate and Rhythm: Normal rate.  Pulmonary:     Effort: Pulmonary effort is normal.     Breath sounds: Normal breath sounds.  Skin:    General: Skin is warm.     Capillary Refill: Capillary refill takes less than 2 seconds.  Neurological:     General: No focal deficit present.     Mental Status: She is oriented to person, place, and time. Mental status is at baseline.  Psychiatric:        Mood and Affect: Mood normal.        Behavior: Behavior normal.      UC Treatments / Results  Labs (all labs ordered are listed, but only abnormal results are displayed) Labs Reviewed - No data to display  EKG   Radiology No results found.  Procedures Procedures (including critical care time)  Medications Ordered in UC Medications - No data to display  Initial Impression / Assessment and Plan / UC Course  I have reviewed the triage vital signs and the  nursing notes.  Pertinent labs & imaging results that were available during my care of the patient were reviewed by me and considered in my medical decision making (see chart for details).    Patient unable to tolerate everting of the upper eyelid due to pain. Advised treating presumptively for blepharitis of right upper eyelid with acute conjunctivitis. Advised to schedule a follow-up with ophthalmologist next business day to ensure no other pathologies are causing symptoms. Advised that I can concerned about possible tearduct blockage given persistent tearing and history of chronic dry eyes.  ER precautions given. Strict return precautions given.  Final Clinical Impressions(s) / UC Diagnoses   Final diagnoses:  Blepharitis of right upper eyelid, unspecified type  Acute conjunctivitis of right eye, unspecified acute conjunctivitis type  Excessive tearing, right     Discharge Instructions      Follow-up with Dr. Purvis Sheffield office on Tuesday for evaluation to ensure tear ducts are not blocked. Take medication as prescribed.  You can use the tetracaine 3 times daily as needed for pain. If any of your symptoms worsen or you experience any severe changes in visual acuity go immediately to the emergency department.     ED Prescriptions     Medication Sig Dispense Auth. Provider   ofloxacin (OCUFLOX) 0.3 % ophthalmic solution Place 1 drop  into the right eye in the morning, at noon, and at bedtime for 5 days. 5 mL Scot Jun, FNP   doxycycline (VIBRAMYCIN) 100 MG capsule Take 1 capsule (100 mg total) by mouth 2 (two) times daily for 7 days. 14 capsule Scot Jun, FNP      PDMP not reviewed this encounter.   Seraiah, [place], FNP 02/22/22 2217

## 2022-02-17 NOTE — ED Triage Notes (Signed)
Patient presents to [facility] for bilateral eye burning and pain. States burning sensation has been an on-going issue since she tested positive for covid. States last night she developed right eye pain. Unsure if she has something in eye, her husband looked but nothing found in eye. States pain is at upper eye area. Using warm compresses to increase tear production to help flush eye. States she did notice some drainage.

## 2022-02-20 DIAGNOSIS — Z872 Personal history of diseases of the skin and subcutaneous tissue: Secondary | ICD-10-CM | POA: Diagnosis not present

## 2022-02-20 DIAGNOSIS — L578 Other skin changes due to chronic exposure to nonionizing radiation: Secondary | ICD-10-CM | POA: Diagnosis not present

## 2022-02-20 DIAGNOSIS — L298 Other pruritus: Secondary | ICD-10-CM | POA: Diagnosis not present

## 2022-02-20 DIAGNOSIS — H16223 Keratoconjunctivitis sicca, not specified as Sjogren's, bilateral: Secondary | ICD-10-CM | POA: Diagnosis not present

## 2022-02-20 DIAGNOSIS — L570 Actinic keratosis: Secondary | ICD-10-CM | POA: Diagnosis not present

## 2022-02-20 DIAGNOSIS — Z808 Family history of malignant neoplasm of other organs or systems: Secondary | ICD-10-CM | POA: Diagnosis not present

## 2022-02-23 ENCOUNTER — Ambulatory Visit: Payer: BC Managed Care – PPO | Admitting: Gastroenterology

## 2022-03-18 DIAGNOSIS — R7309 Other abnormal glucose: Secondary | ICD-10-CM | POA: Diagnosis not present

## 2022-03-22 ENCOUNTER — Telehealth: Payer: Self-pay | Admitting: Gastroenterology

## 2022-03-22 NOTE — Telephone Encounter (Signed)
Very good to know that she is feeling better. I recommend that she sees a dietitian to further advise her on long term options.

## 2022-03-22 NOTE — Telephone Encounter (Signed)
Spoke with patient regarding MD recommendations. Pt verbalized all understanding. 

## 2022-03-22 NOTE — Telephone Encounter (Signed)
Inbound call from patient requesting a call in regards to abdominal pain she is experiencing.

## 2022-03-22 NOTE — Telephone Encounter (Signed)
Patient called in stating that for the last 30-40 days she has been drinking one slimfast a day & all of her symptoms have completely disappeared. She is no longer experiencing any abdominal pain or loose stools, which had been previously going on for the last couple years almost. She would like to know Dr. Lynne Leader recommendations on whether or not she should continue the slimfast daily long term or if there are other healthier alternatives that he would suggest. She's unsure what the connection may have been for her symptoms to resolve, but feels much better. Will route to MD.

## 2022-04-18 DIAGNOSIS — R7309 Other abnormal glucose: Secondary | ICD-10-CM | POA: Diagnosis not present

## 2022-05-18 DIAGNOSIS — R7309 Other abnormal glucose: Secondary | ICD-10-CM | POA: Diagnosis not present

## 2022-05-21 DIAGNOSIS — N952 Postmenopausal atrophic vaginitis: Secondary | ICD-10-CM | POA: Diagnosis not present

## 2022-05-21 DIAGNOSIS — N362 Urethral caruncle: Secondary | ICD-10-CM | POA: Diagnosis not present

## 2022-05-21 DIAGNOSIS — R8290 Unspecified abnormal findings in urine: Secondary | ICD-10-CM | POA: Diagnosis not present

## 2022-05-21 DIAGNOSIS — F419 Anxiety disorder, unspecified: Secondary | ICD-10-CM | POA: Diagnosis not present

## 2022-06-13 ENCOUNTER — Encounter: Payer: BC Managed Care – PPO | Admitting: Nurse Practitioner

## 2022-06-18 DIAGNOSIS — R7309 Other abnormal glucose: Secondary | ICD-10-CM | POA: Diagnosis not present

## 2022-07-01 IMAGING — CT CT RENAL STONE PROTOCOL
2 of 4 series · 17 of 46 positions shown, 19 images · non-contrast
Comparison: 06/30/2020

CLINICAL DATA: Left-sided flank pain

EXAM:
CT ABDOMEN AND PELVIS WITHOUT CONTRAST
TECHNIQUE: Multidetector CT imaging of the abdomen and pelvis was performed
following the standard protocol without IV contrast.

[Series 2: axial st · axial · 0.70mm/px · z∈[+1217,+1617]mm · 14 of 92 slices shown, 16 images]
[im 6/92  soft-tissue]
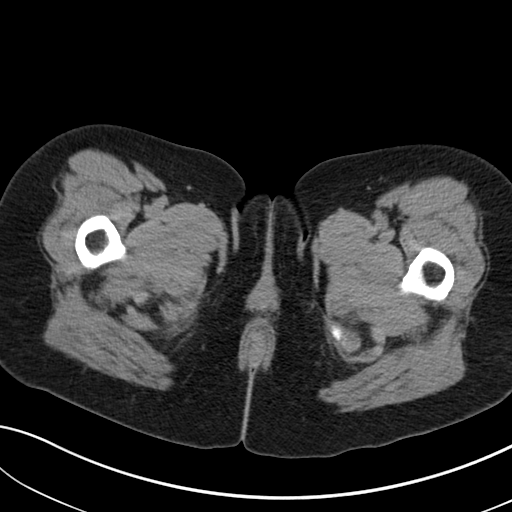
[im 6/92  bone]
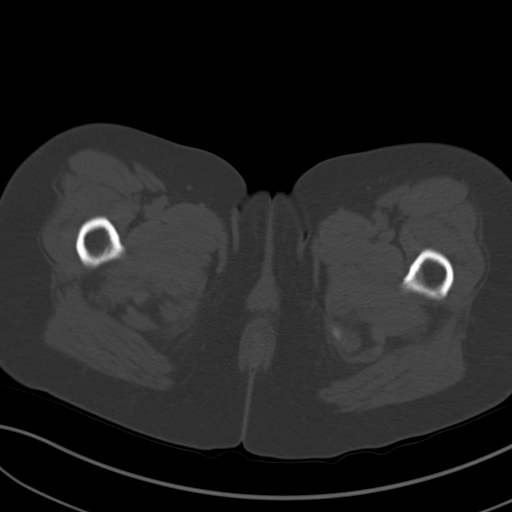
[im 11/92  soft-tissue]
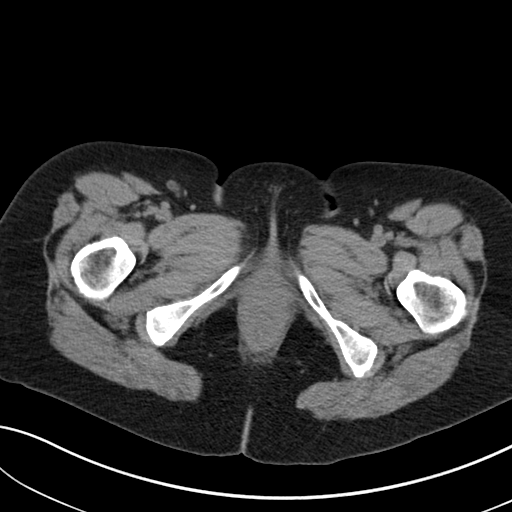
[im 21/92  soft-tissue]
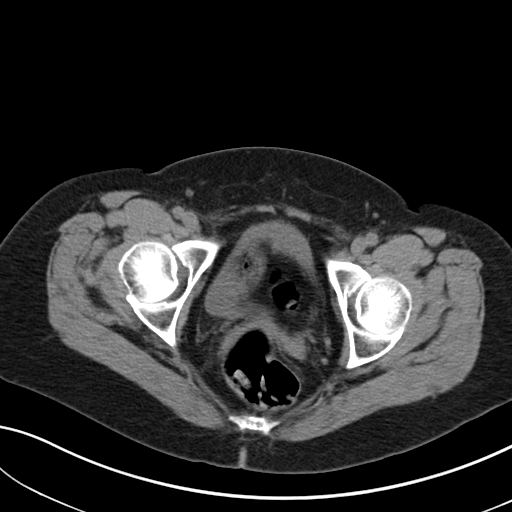
[im 26/92  soft-tissue]
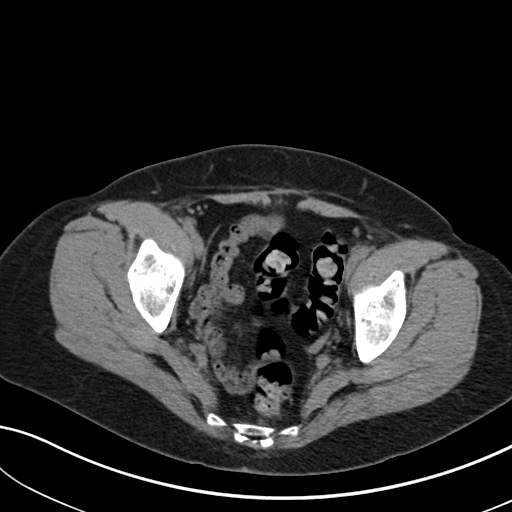
[im 31/92  soft-tissue]
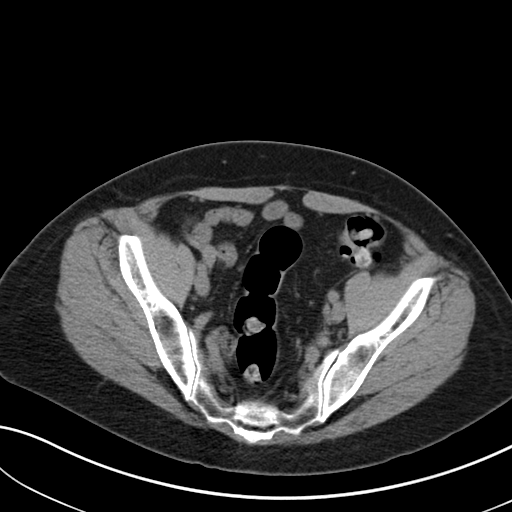
[im 36/92  soft-tissue]
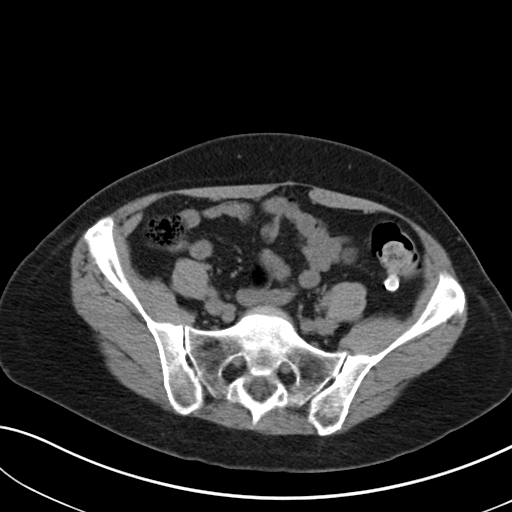
[im 41/92  soft-tissue]
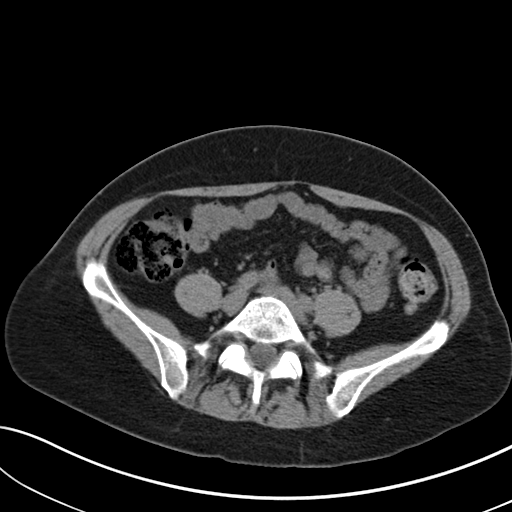
[im 51/92  soft-tissue]
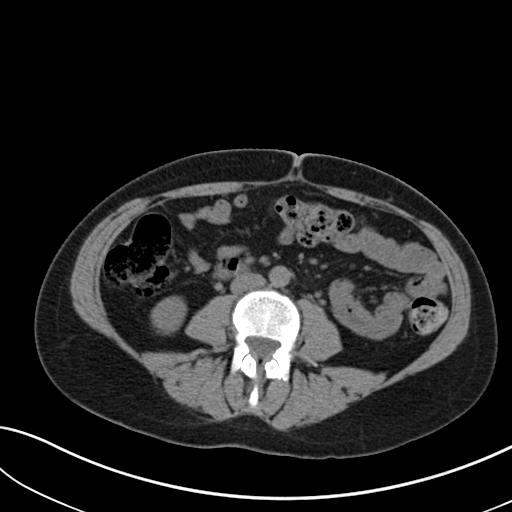
[im 56/92  soft-tissue]
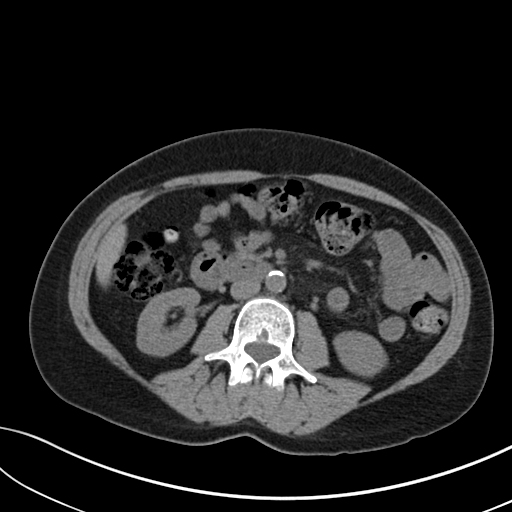
[im 56/92  bone]
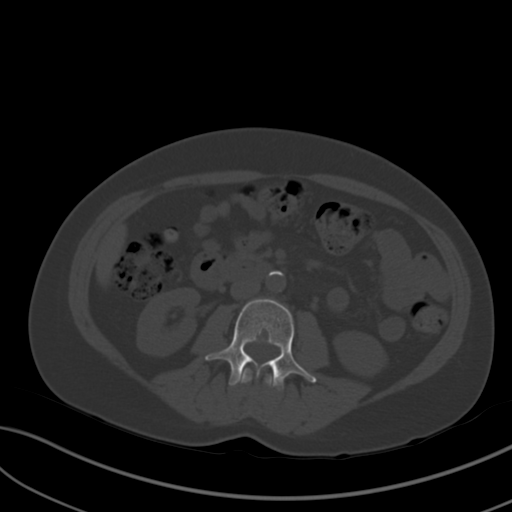
[im 61/92  soft-tissue]
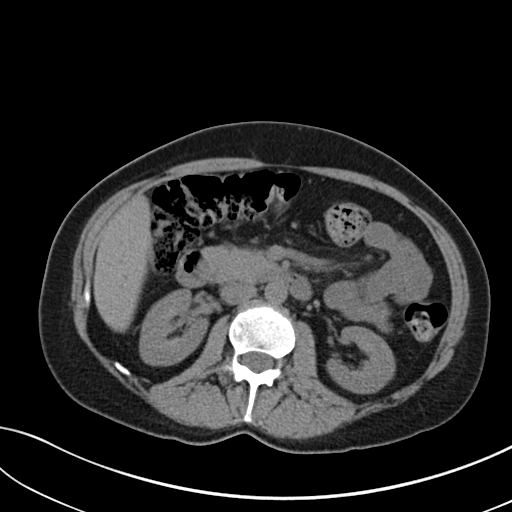
[im 66/92  soft-tissue]
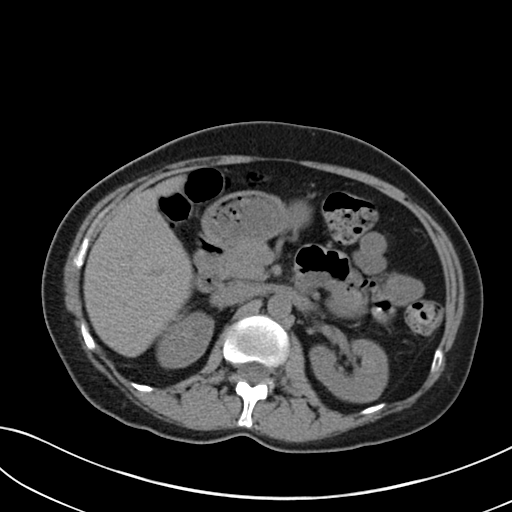
[im 71/92  soft-tissue]
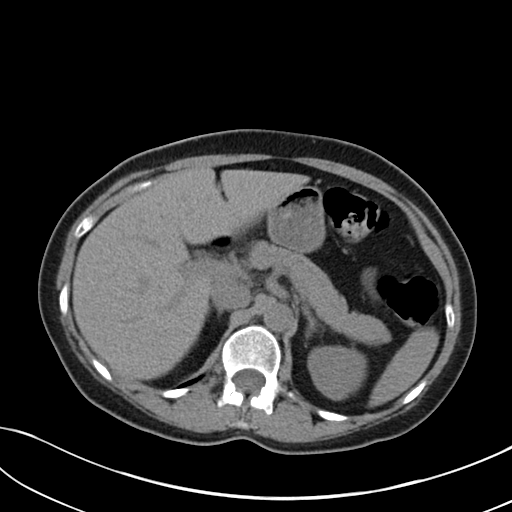
[im 81/92  soft-tissue]
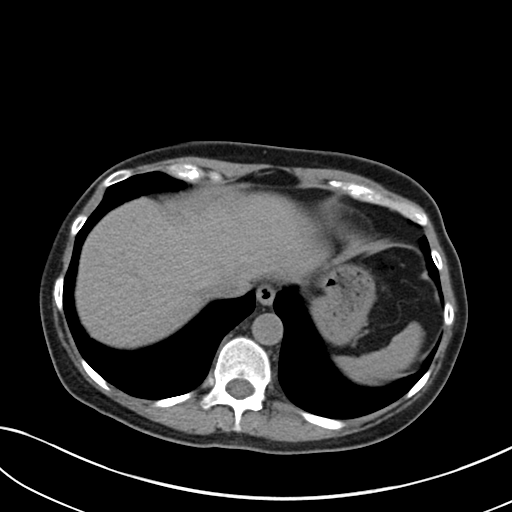
[im 86/92  soft-tissue]
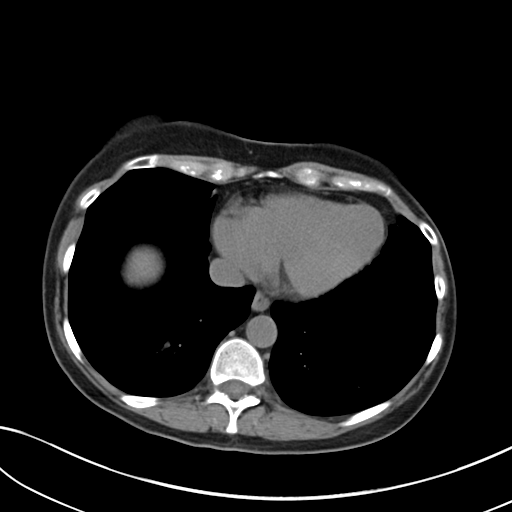

[Series 4: coronal · coronal · 0.76mm/px · 3 of 126 slices shown]
[im 42/126  soft-tissue]
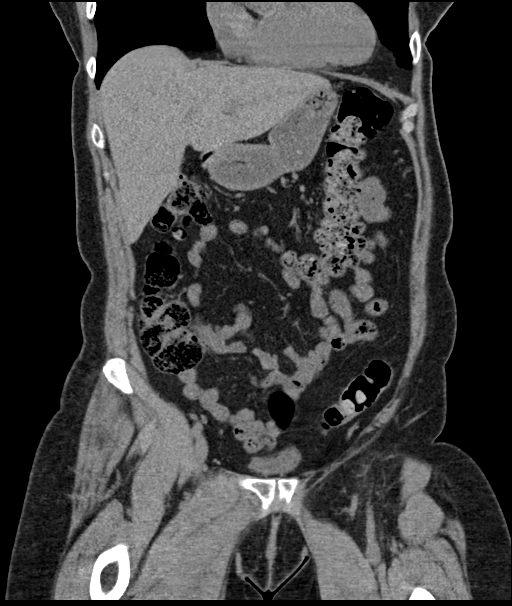
[im 56/126  soft-tissue]
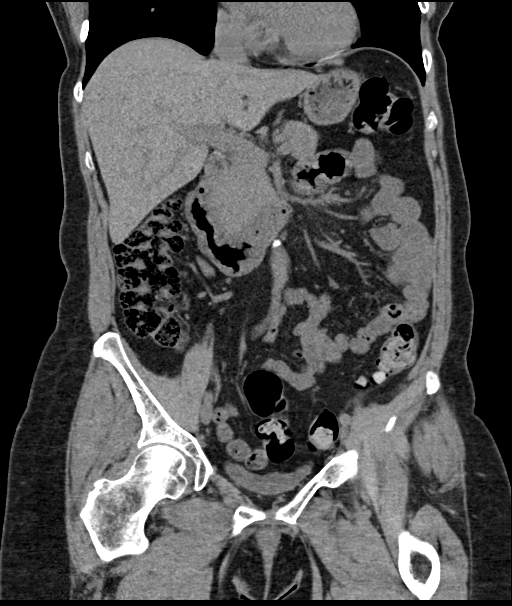
[im 70/126  soft-tissue]
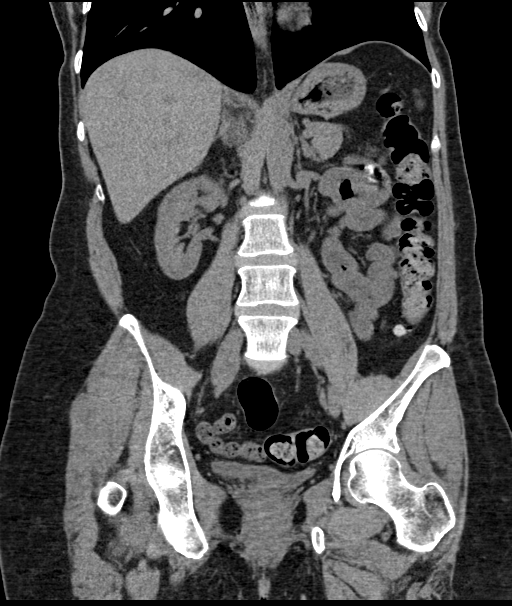

[17 of 46 positions shown; findings below may reference images not displayed]

FINDINGS: Lower chest: No acute abnormality.

Hepatobiliary: No focal liver abnormality is seen. Status post
cholecystectomy. No biliary dilatation.

Pancreas: Unremarkable. No pancreatic ductal dilatation or
surrounding inflammatory changes.

Spleen: Normal in size without focal abnormality.

Adrenals/Urinary Tract: Adrenal glands are within normal limits.
Kidneys are well visualized without obstructive change. Ureters are
within normal limits. The bladder is decompressed.

Stomach/Bowel: Scattered diverticular change of the colon is noted.
The appendix has been surgically removed. Small bowel and stomach
are within normal limits.

Vascular/Lymphatic: Aortic atherosclerosis. No enlarged abdominal or
pelvic lymph nodes.

Reproductive: Status post hysterectomy. No adnexal masses.

Other: No abdominal wall hernia or abnormality. No abdominopelvic
ascites.

Musculoskeletal: No acute or significant osseous findings.
IMPRESSION: Diverticulosis without diverticulitis.

No renal calculi or obstructive changes are noted.

No acute abnormality is seen.

## 2022-07-19 DIAGNOSIS — R7309 Other abnormal glucose: Secondary | ICD-10-CM | POA: Diagnosis not present

## 2022-08-17 DIAGNOSIS — R7309 Other abnormal glucose: Secondary | ICD-10-CM | POA: Diagnosis not present

## 2022-09-11 ENCOUNTER — Encounter: Payer: Self-pay | Admitting: Physician Assistant

## 2022-09-11 ENCOUNTER — Telehealth: Payer: Self-pay | Admitting: Internal Medicine

## 2022-09-11 ENCOUNTER — Ambulatory Visit (INDEPENDENT_AMBULATORY_CARE_PROVIDER_SITE_OTHER): Payer: Self-pay | Admitting: Physician Assistant

## 2022-09-11 ENCOUNTER — Other Ambulatory Visit: Payer: Self-pay

## 2022-09-11 VITALS — BP 118/76 | HR 77 | Temp 96.4°F

## 2022-09-11 DIAGNOSIS — L309 Dermatitis, unspecified: Secondary | ICD-10-CM

## 2022-09-11 MED ORDER — TRIAMCINOLONE ACETONIDE 0.1 % EX CREA: 1.0000 | TOPICAL_CREAM | Freq: Two times a day (BID) | CUTANEOUS | 0 refills | Status: DC

## 2022-09-11 NOTE — Telephone Encounter (Signed)
Left message for patient informing her she may want to reach out to PCP regarding symptoms and use of inhaler.  Provided office number for callback to rule out other possible cardiac symptoms.

## 2022-09-11 NOTE — Telephone Encounter (Signed)
Pt c/o Shortness Of Breath: STAT if SOB developed within the last 24 hours or pt is noticeably SOB on the phone  1. Are you currently SOB (can you hear that pt is SOB on the phone)? No  2. How long have you been experiencing SOB? Noticed it within the last month, but now it's become more frequent  3. Are you SOB when sitting or when up moving around? Moving around, just by walking  4. Are you currently experiencing any other symptoms? Dizziness  Pt had covid in September of last year and was having issues with coughing. Patient was given an inhaler and that helped with the coughing. Pt was wondering if they should start using that inhaler again.

## 2022-09-11 NOTE — Progress Notes (Signed)
[facility] [address], [place] [address]   Office Visit Note  Patient Name: Molly Peterson Date of Birth [id number]  Medical Record number [id number]  Date of Service: 09/11/2022  Chief Complaint  Patient presents with   Rash    L upper chest: Red & itching, approximately 1 month      57 y/o F presents to the clinic for c/o an itchy lesion on the left anterior upper chest wall x 1 month. Pt states at times is very itchy. No changes in size or color. Has h/o psoriasis as a teenager, but no new occurrences since. She applied cortisone cream which helps a little, but hasn't seen the area decrease in size. No h/o skin cancer. She does follow up with Dermatologist for yearly skin check, but hasn't followed up this year yet due to scheduling difficulties.    Rash      Current Medication:  Outpatient Encounter Medications as of 09/11/2022  Medication Sig   calcium carbonate (CALCIUM 600) 600 MG TABS tablet Take 600 mg by mouth daily with breakfast.   metoprolol succinate (TOPROL-XL) 50 MG 24 hr tablet TAKE ONE TABLET BY MOUTH EVERY DAY IMMEDIATELY FOLLOWING A MEAL   Multiple Vitamins-Minerals (CENTRUM SILVER PO) Take 1 tablet by mouth daily.   rosuvastatin (CRESTOR) 10 MG tablet Take 1 tablet (10 mg total) by mouth every other day.   triamcinolone cream (KENALOG) 0.1 % Apply 1 Application topically 2 (two) times daily.   albuterol (VENTOLIN HFA) 108 (90 Base) MCG/ACT inhaler Inhale 2 puffs into the lungs every 6 (six) hours as needed for wheezing or shortness of breath. (Patient not taking: Reported on 01/04/2022)   ALPRAZolam (XANAX) 0.25 MG tablet Take 1 tablet (0.25 mg total) by mouth daily as needed for anxiety.   Loteprednol Etabonate (EYSUVIS) 0.25 % SUSP Apply to eye.   No facility-administered encounter medications on file as of 09/11/2022.      Medical History: Past Medical History:  Diagnosis Date   Acute pharyngitis    ANXIETY 09/18/2006   on PRN  meds for situational anxiety   Chronic kidney disease    hx kidney stones   DEPRESSION 09/18/2006   DERMATITIS, SEBORRHEIC NEC 01/06/2007   Edema    symptom   Flatulence, eructation, and gas pain    Hyperlipidemia    on meds   HYPERTENSION 09/18/2006   on meds   Osteopenia    Palpitations 09/18/2006   REACTION, ACUTE STRESS W/EMOTIONAL DSTURB 12/23/2006   Seasonal allergies    Unspecified constipation    Urinary tract infection, site not specified      Vital Signs: BP 118/76 (BP Location: Left Arm, Patient Position: Sitting)   Pulse 77   Temp (!) 96.4 F (35.8 C) (Tympanic)   SpO2 97%    Review of Systems  Constitutional: Negative.   Skin:  Positive for rash.    Physical Exam Constitutional:      Appearance: Normal appearance.  HENT:     Head: Normocephalic and atraumatic.     Right Ear: External ear normal.     Left Ear: External ear normal.  Eyes:     Extraocular Movements: Extraocular movements intact.  Cardiovascular:     Rate and Rhythm: Normal rate and regular rhythm.  Pulmonary:     Effort: Pulmonary effort is normal.     Breath sounds: Normal breath sounds.  Skin:    General: Skin is warm and dry.  Comments: Left Anterior Chest wall: well demarcated raised plaque with yellowish/whitish area with erythematous border.   Neurological:     Mental Status: She is alert and oriented to person, place, and time.  Psychiatric:        Mood and Affect: Mood normal.        Behavior: Behavior normal.       Assessment/Plan:  1. Dermatitis - triamcinolone cream (KENALOG) 0.1 %; Apply 1 Application topically 2 (two) times daily.  Dispense: 15 g; Refill: 0 - Ambulatory referral to Dermatology  Apply cream as prescribed Take otc Cetirizine for itching. Apply cold compresses or ice packs to help reduce itching. Follow up with Dermatology for further evaluation and management. Referral in chart.   General Counseling: levina cannistraci  understanding of the findings of todays visit and agrees with plan of treatment. I have discussed any further diagnostic evaluation that may be needed or ordered today. We also reviewed her medications today. she has been encouraged to call the office with any questions or concerns that should arise related to todays visit.    Time spent:20 [place], [place] Physician Assistant

## 2022-09-11 NOTE — Telephone Encounter (Signed)
Patient returning call, transferred from operator.  Patient reports she has been experiencing shortness of breath over the last month. She states it has progressively gotten worse. She reports SOB with exertion (e.g. on Saturday walking through parking lot at [facility], or doing chores around the house). She reports feeling some dizziness when she is short of breath.   Patient denies any CP or cough. Reports chronic swelling, no change in baseline.  She states she had been treated for bronchitis last year. At that time she was prescribed an inhaler and reports this helped her cough. Had COVID in September 2023 with only a high fever for 3 days.  Patient states she has already reached out to her PCP's office and could not get an appt until May.  Offered next available appt in our office to be seen for evaluation of SOB to rule out cardiac cause, patient scheduled to see Ambrose Pancoast, NP on 09/28/22.  Advised patient on ED precautions, patient verbalized understanding.  Will forward to Dr. Harrington Challenger to review and advise.

## 2022-09-12 ENCOUNTER — Ambulatory Visit (INDEPENDENT_AMBULATORY_CARE_PROVIDER_SITE_OTHER): Payer: BC Managed Care – PPO | Admitting: Nurse Practitioner

## 2022-09-12 ENCOUNTER — Ambulatory Visit (INDEPENDENT_AMBULATORY_CARE_PROVIDER_SITE_OTHER)
Admission: RE | Admit: 2022-09-12 | Discharge: 2022-09-12 | Disposition: A | Discharge status: Home / Self Care | Payer: BC Managed Care – PPO | Source: Ambulatory Visit | Attending: Nurse Practitioner | Admitting: Nurse Practitioner

## 2022-09-12 ENCOUNTER — Encounter: Payer: Self-pay | Admitting: Nurse Practitioner

## 2022-09-12 VITALS — BP 124/76 | HR 81 | Temp 98.8°F | Resp 16 | Ht 62.0 in | Wt 147.2 lb

## 2022-09-12 DIAGNOSIS — R5383 Other fatigue: Secondary | ICD-10-CM | POA: Diagnosis not present

## 2022-09-12 DIAGNOSIS — R0600 Dyspnea, unspecified: Secondary | ICD-10-CM | POA: Insufficient documentation

## 2022-09-12 DIAGNOSIS — R0609 Other forms of dyspnea: Secondary | ICD-10-CM | POA: Diagnosis not present

## 2022-09-12 NOTE — Progress Notes (Signed)
Acute Office Visit  Subjective:     Patient ID: Molly Peterson, female    DOB: 1965-08-29, 57 y.o.   MRN: [id number]  Chief Complaint  Patient presents with   Shortness of Breath    X 30+ days no chest tightness or soreness     Patient is in today for [facility] with a history of asthma, HTN, Aortic atherosclerosis,  States that she did notice it over the past 30 days. States a coworker noticed that she was breathing heavy.   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 States that she is taking stronger breaths. States that she will notice it at rest. States that it is not with activity.  States that Saturday she walked across the parking lot and she was heavy breathing. States that she has not had to use the albuterol inhaler   States that she does not feel short of breath but notices that she is deep breathing then it will dissapate some times. She states that she will try and figure No history of DVT States that she is on estradiol topcial cream that is once weekly.  States that her 25 year old son has narcolepsy and recently moved back home over the past year.  He has been having ups and downs is caused increased stress at home     09/12/2022   10:16 AM  GAD 7 : Generalized Anxiety Score  Nervous, Anxious, on Edge 2  Control/stop worrying 1  Worry too much - different things 2  Trouble relaxing 2  Restless 0  Easily annoyed or irritable 2  Afraid - awful might happen 1  Total GAD 7 Score 10  Anxiety Difficulty Somewhat difficult        09/12/2022   10:16 AM 07/26/2021    2:23 PM 06/14/2021    8:49 AM  PHQ9 SCORE ONLY  PHQ-9 Total Score 10 0 0     Review of Systems  Constitutional:  Negative for chills and fever.  Respiratory:  Positive for shortness of breath. Negative for cough and wheezing.   Cardiovascular:  Negative for chest pain, orthopnea, leg swelling and PND.  Gastrointestinal:  Negative for abdominal pain, constipation, diarrhea,  nausea and vomiting.        Objective:    BP 124/76   Pulse 81   Temp 98.8 F (37.1 C)   Resp 16   Ht 5\' 2"  (1.575 m)   Wt 147 lb 4 oz (66.8 kg)   SpO2 99%   BMI 26.93 kg/m    Physical Exam Vitals and nursing note reviewed.  Constitutional:      Appearance: Normal appearance.  Cardiovascular:     Rate and Rhythm: Normal rate and regular rhythm.     Heart sounds: Normal heart sounds.  Pulmonary:     Effort: Pulmonary effort is normal.     Breath sounds: Normal breath sounds.  Musculoskeletal:     Right lower leg: No edema.     Left lower leg: No edema.  Neurological:     Mental Status: She is alert.     No results found for any visits on 09/12/22.      Assessment & Plan:   Problem List Items Addressed This Visit       Other   Other fatigue    Likely multifactorial will check basic labs inclusive of B12 and vitamin D      Relevant Orders   TSH   Vitamin B12  VITAMIN D 25 Hydroxy (Vit-D Deficiency, Fractures)   Dyspnea - Primary    Check stress is more deep breathing with exertion is intermittent.  No true shortness of breath.  Obtain chest x-ray basic labs pending results.      Relevant Orders   CBC   Comprehensive metabolic panel   D-dimer, quantitative   TSH   DG Chest 2 View (Completed)    No orders of the defined types were placed in this encounter.   Return if symptoms worsen or fail to improve, for As scheduled .  Romilda Garret, NP

## 2022-09-12 NOTE — Assessment & Plan Note (Signed)
Check stress is more deep breathing with exertion is intermittent.  No true shortness of breath.  Obtain chest x-ray basic labs pending results.

## 2022-09-12 NOTE — Assessment & Plan Note (Signed)
Likely multifactorial will check basic labs inclusive of B12 and vitamin D

## 2022-09-12 NOTE — Patient Instructions (Signed)
Nice to see you today I will be in touch with the labs and xray once I have them Follow up with me as scheduled, sooner if you need me

## 2022-09-13 LAB — COMPREHENSIVE METABOLIC PANEL
ALT: 13 IU/L (ref 0–32)
AST: 15 IU/L (ref 0–40)
Albumin/Globulin Ratio: 1.8 (ref 1.2–2.2)
Albumin: 4.2 g/dL (ref 3.8–4.9)
Alkaline Phosphatase: 70 IU/L (ref 44–121)
BUN/Creatinine Ratio: 23 (ref 9–23)
BUN: 18 mg/dL (ref 6–24)
Bilirubin Total: 0.4 mg/dL (ref 0.0–1.2)
CO2: 29 mmol/L (ref 20–29)
Calcium: 9.7 mg/dL (ref 8.7–10.2)
Chloride: 101 mmol/L (ref 96–106)
Creatinine, Ser: 0.78 mg/dL (ref 0.57–1.00)
Globulin, Total: 2.3 g/dL (ref 1.5–4.5)
Glucose: 75 mg/dL (ref 70–99)
Potassium: 4.7 mmol/L (ref 3.5–5.2)
Sodium: 140 mmol/L (ref 134–144)
Total Protein: 6.5 g/dL (ref 6.0–8.5)
eGFR: 89 mL/min/{1.73_m2} (ref >59)

## 2022-09-13 LAB — LAB REPORT - SCANNED: EGFR: 89

## 2022-09-13 LAB — VITAMIN B12: Vitamin B-12: 477 pg/mL (ref 232–1245)

## 2022-09-13 LAB — CBC
Hematocrit: 41.1 % (ref 34.0–46.6)
Hemoglobin: 13.8 g/dL (ref 11.1–15.9)
MCH: 30.2 pg (ref 26.6–33.0)
MCHC: 33.6 g/dL (ref 31.5–35.7)
MCV: 90 fL (ref 79–97)
Platelets: 214 10*3/uL (ref 150–450)
RBC: 4.57 x10E6/uL (ref 3.77–5.28)
RDW: 12.3 % (ref 11.7–15.4)
WBC: 5.4 10*3/uL (ref 3.4–10.8)

## 2022-09-13 LAB — D-DIMER, QUANTITATIVE: D-DIMER: <0.2 mg/L FEU (ref 0.00–0.49)

## 2022-09-13 LAB — VITAMIN D 25 HYDROXY (VIT D DEFICIENCY, FRACTURES): Vit D, 25-Hydroxy: 34.7 ng/mL (ref 30.0–100.0)

## 2022-09-13 LAB — TSH: TSH: 1.43 u[IU]/mL (ref 0.450–4.500)

## 2022-09-17 ENCOUNTER — Telehealth: Payer: Self-pay | Admitting: Nurse Practitioner

## 2022-09-17 ENCOUNTER — Encounter: Payer: Self-pay | Admitting: Nurse Practitioner

## 2022-09-17 ENCOUNTER — Telehealth (INDEPENDENT_AMBULATORY_CARE_PROVIDER_SITE_OTHER): Payer: BC Managed Care – PPO | Admitting: Nurse Practitioner

## 2022-09-17 VITALS — Ht 62.0 in | Wt 147.2 lb

## 2022-09-17 DIAGNOSIS — R7309 Other abnormal glucose: Secondary | ICD-10-CM | POA: Diagnosis not present

## 2022-09-17 DIAGNOSIS — F430 Acute stress reaction: Secondary | ICD-10-CM

## 2022-09-17 MED ORDER — BUSPIRONE HCL 5 MG PO TABS: 5.0000 mg | ORAL_TABLET | Freq: Two times a day (BID) | ORAL | 1 refills | Status: DC

## 2022-09-17 NOTE — Progress Notes (Signed)
Patient ID: Molly Peterson, female    DOB: [birth year], 57 y.o.   MRN: [id number]  Virtual visit completed through [place], a video enabled telemedicine application. Due to national recommendations of social distancing due to COVID-19, a virtual visit is felt to be most appropriate for this patient at this time. Reviewed limitations, risks, security and privacy concerns of performing a virtual visit and the availability of in person appointments. I also reviewed that there may be a patient responsible charge related to this service. The patient agreed to proceed.   Patient location: home Provider location: [place] [facility], office Persons participating in this virtual visit: patient, provider   If any vitals were documented, they were collected by patient at home unless specified below.    Ht 5\' 2"  (1.575 m)   Wt 147 lb 4 oz (66.8 kg)   BMI 26.93 kg/m    CC: Stress/Anxiety Subjective:   HPI: Molly Peterson is a 57 y.o. female presenting on 09/17/2022 for Medication Consultation   Anxiety/SHOB: Patient was seen by me on 09/12/2022 with issues with deep breathing at rest.  We did rule out metabolic things inclusive of PE and a chest x-ray that came back normal.  Patient is living with her 37 year old son who is battling narcolepsy and has swings in mood from good to bad.  This has been playing a role on her and his stress level.  Patient denies HI/SI/AVH.       09/12/2022   10:16 AM 07/26/2021    2:23 PM 06/14/2021    8:49 AM  PHQ9 SCORE ONLY  PHQ-9 Total Score 10 0 0        09/12/2022   10:16 AM  GAD 7 : Generalized Anxiety Score  Nervous, Anxious, on Edge 2  Control/stop worrying 1  Worry too much - different things 2  Trouble relaxing 2  Restless 0  Easily annoyed or irritable 2  Afraid - awful might happen 1  Total GAD 7 Score 10  Anxiety Difficulty Somewhat difficult        Relevant past medical, surgical, family and social history reviewed and  updated as indicated. Interim medical history since our last visit reviewed. Allergies and medications reviewed and updated. Outpatient Medications Prior to Visit  Medication Sig Dispense Refill   ALPRAZolam (XANAX) 0.25 MG tablet Take 1 tablet (0.25 mg total) by mouth daily as needed for anxiety. 15 tablet 0   calcium carbonate (CALCIUM 600) 600 MG TABS tablet Take 600 mg by mouth daily with breakfast.     metoprolol succinate (TOPROL-XL) 50 MG 24 hr tablet TAKE ONE TABLET BY MOUTH EVERY DAY IMMEDIATELY FOLLOWING A MEAL 90 tablet 3   Multiple Vitamins-Minerals (CENTRUM SILVER PO) Take 1 tablet by mouth daily.     rosuvastatin (CRESTOR) 10 MG tablet Take 1 tablet (10 mg total) by mouth every other day. 90 tablet 3   albuterol (VENTOLIN HFA) 108 (90 Base) MCG/ACT inhaler Inhale 2 puffs into the lungs every 6 (six) hours as needed for wheezing or shortness of breath. (Patient not taking: Reported on 01/04/2022) 8 g 2   Loteprednol Etabonate (EYSUVIS) 0.25 % SUSP Apply to eye. (Patient not taking: Reported on 09/17/2022)     triamcinolone cream (KENALOG) 0.1 % Apply 1 Application topically 2 (two) times daily. (Patient not taking: Reported on 09/17/2022) 15 g 0   No facility-administered medications prior to visit.     Per HPI unless specifically indicated in ROS section below  Review of Systems Objective:  Ht 5\' 2"  (1.575 m)   Wt 147 lb 4 oz (66.8 kg)   BMI 26.93 kg/m   Wt Readings from Last 3 Encounters:  09/17/22 147 lb 4 oz (66.8 kg)  09/12/22 147 lb 4 oz (66.8 kg)  01/04/22 145 lb 6.4 oz (66 kg)       Physical exam: Gen: alert, NAD, not ill appearing Pulm: speaks in complete sentences without increased work of breathing Psych: normal mood, normal thought content      Results for orders placed or performed in visit on 09/12/22  CBC  Result Value Ref Range   WBC 5.4 3.4 - 10.8 x10E3/uL   RBC 4.57 3.77 - 5.28 x10E6/uL   Hemoglobin 13.8 11.1 - 15.9 g/dL   Hematocrit 41.1 34.0 - 46.6  %   MCV 90 79 - 97 fL   MCH 30.2 26.6 - 33.0 pg   MCHC 33.6 31.5 - 35.7 g/dL   RDW 12.3 11.7 - 15.4 %   Platelets 214 150 - 450 x10E3/uL  Comprehensive metabolic panel  Result Value Ref Range   Glucose 75 70 - 99 mg/dL   BUN 18 6 - 24 mg/dL   Creatinine, Ser 0.78 0.57 - 1.00 mg/dL   eGFR 89 >59 mL/min/1.73   BUN/Creatinine Ratio 23 9 - 23   Sodium 140 134 - 144 mmol/L   Potassium 4.7 3.5 - 5.2 mmol/L   Chloride 101 96 - 106 mmol/L   CO2 29 20 - 29 mmol/L   Calcium 9.7 8.7 - 10.2 mg/dL   Total Protein 6.5 6.0 - 8.5 g/dL   Albumin 4.2 3.8 - 4.9 g/dL   Globulin, Total 2.3 1.5 - 4.5 g/dL   Albumin/Globulin Ratio 1.8 1.2 - 2.2   Bilirubin Total 0.4 0.0 - 1.2 mg/dL   Alkaline Phosphatase 70 44 - 121 IU/L   AST 15 0 - 40 IU/L   ALT 13 0 - 32 IU/L  D-dimer, quantitative  Result Value Ref Range   D-DIMER <0.20 0.00 - 0.49 mg/L FEU  TSH  Result Value Ref Range   TSH 1.430 0.450 - 4.500 uIU/mL  Vitamin B12  Result Value Ref Range   Vitamin B-12 477 232 - 1,245 pg/mL  VITAMIN D 25 Hydroxy (Vit-D Deficiency, Fractures)  Result Value Ref Range   Vit D, 25-Hydroxy 34.7 30.0 - 100.0 ng/mL   Assessment & Plan:   Stress reaction causing mixed disturbance of emotion and conduct Assessment & Plan: Patient's adult son is living at home and has narcolepsy and mood swings in regards to the condition and medication he is on.  Will treat patient for anxiety with buspirone 5 mg twice daily.  Follow-up 6 weeks virtual or in office for anxiety recheck.  She did mention that her son has recently started therapy and is hoping this will be beneficial.  Orders: -     busPIRone HCl; Take 1 tablet (5 mg total) by mouth 2 (two) times daily.  Dispense: 60 tablet; Refill: 1     I discussed the assessment and treatment plan with the patient. The patient was provided an opportunity to ask questions and all were answered. The patient agreed with the plan and demonstrated an understanding of the  instructions. The patient was advised to call back or seek an in-person evaluation if the symptoms worsen or if the condition fails to improve as anticipated.  Follow up plan: Return in about 6 weeks (around 10/29/2022) for GAD/Med recheck.  Romilda Garret, NP

## 2022-09-17 NOTE — Telephone Encounter (Signed)
Spoke to pt, scheduled f/u 10/29/22

## 2022-09-17 NOTE — Assessment & Plan Note (Addendum)
Patient's adult son is living at home and has narcolepsy and mood swings in regards to the condition and medication he is on.  Will treat patient for anxiety with buspirone 5 mg twice daily.  Follow-up 6 weeks virtual or in office for anxiety recheck.  She did mention that her son has recently started therapy and is hoping this will be beneficial.

## 2022-09-17 NOTE — Telephone Encounter (Signed)
Can we schedule patient for a 6 weeks GAD/med recheck appt. Virtual or in person is fine with me

## 2022-09-17 NOTE — Patient Instructions (Signed)
Nice to see you today I have sent the medication to the requested pharmacy Follow up with me in 6 weeks either in person or virtually for recheck

## 2022-09-27 NOTE — Progress Notes (Signed)
Office Visit    Molly Peterson Name: Molly Peterson Date of Encounter: 09/27/2022  Primary Care Provider:  Gweneth Dimitri, MD Primary Cardiologist:  Dietrich Pates, MD Primary Electrophysiologist: None  Chief Complaint    Molly Peterson is a 57 y.o. female with PMH HTN, HLD, anxiety, aortic calcification, depression, palpitations, family history of CAD who presents today for complaint of shortness of breath.  Past Medical History    Past Medical History:  Diagnosis Date   Acute pharyngitis    ANXIETY 09/18/2006   on PRN meds for situational anxiety   Chronic kidney disease    hx kidney stones   DEPRESSION 09/18/2006   DERMATITIS, SEBORRHEIC NEC 01/06/2007   Edema    symptom   Flatulence, eructation, and gas pain    Hyperlipidemia    on meds   HYPERTENSION 09/18/2006   on meds   Osteopenia    Palpitations 09/18/2006   REACTION, ACUTE STRESS W/EMOTIONAL DSTURB 12/23/2006   Seasonal allergies    Unspecified constipation    Urinary tract infection, site not specified    Past Surgical History:  Procedure Laterality Date   ABDOMINAL HYSTERECTOMY     partial-still has ovaries   APPENDECTOMY  unknown   CESAREAN SECTION     x 2   CHOLECYSTECTOMY     COLONOSCOPY  2017   MS-MAC-goor prep-int/ext hem/SSP/adenoma-5 yr recall   CYSTOSCOPY  2020   POLYPECTOMY  2017   SSP/adenoma   TONSILLECTOMY  unknown    Allergies  Allergies  Allergen Reactions   Other Anaphylaxis, Hives and Other (See Comments)    Lobster cause large blister on tongue   Sulfa Antibiotics Other (See Comments) and Hives    Dizziness   Latex Itching and Swelling    History of Present Illness    Molly Peterson  is a 57 year old female with the above mention past medical history who presents today for complaint of shortness of breath.  She has been followed by cardiology since 2010 when she was seen for complaint of chest pain by Dr. Daleen Squibb.  She completed a normal myocardial perfusion scan in 2008.   She has been followed by Dr. Tenny Craw since 06/2013 and completed a vascular ultrasound of the carotids that showed intimal thickening and was started on statin.  Echo in 2018 LVEF 60 to 65% with Gr I diastolic dysfunction. She was seen by Chelsea Aus, PA on 04/2018 complaint of chest discomfort.  She underwent a cardiac CTA that showed minimal aortic plaquing with a calcium score of 0.  She was seen in 2022 with complaint of fatigue chest pressure and arm pain.  She underwent a stress echo that showed that was normal.  She was last seen by Dr. Tenny Craw on 08/2021 and Molly Peterson had no complaints of chest discomfort.  Blood pressure was doing well off of medications.  She was having some GI discomfort and had a possible referral to [facility].  Molly Peterson presents today for complaint of shortness of breath and dizziness.  She also has experienced a isolated episode of shoulder plain spreading from the left shoulder to the right shoulder.  Since last being seen in the office Molly Peterson reports that her discomfort has not reoccurred however shortness of breath does occur from time to time.  She reports that her son who is disabled recently moved back to live with her.  This has caused a tremendous amount of stress and anxiety.  She was recently seen by her  PCP and started on BuSpar.  Her blood pressure today is well-controlled at 128/84 and heart rate was 62 bpm.  She is euvolemic on exam today.  She denies any current swelling in her hands or lower extremities.  She reports compliance with her medications and denies any current adverse reactions.  During today's visit we discussed strategies for reducing anxiety such as medication compliance and exercise.  Molly Peterson denies ongoing chest pain, palpitations, dyspnea, PND, orthopnea, nausea, vomiting, dizziness, syncope, edema, weight gain, or early satiety.   Home Medications    Current Outpatient Medications  Medication Sig Dispense Refill   albuterol (VENTOLIN HFA) 108 (90 Base)  MCG/ACT inhaler Inhale 2 puffs into the lungs every 6 (six) hours as needed for wheezing or shortness of breath. (Molly Peterson not taking: Reported on 01/04/2022) 8 g 2   ALPRAZolam (XANAX) 0.25 MG tablet Take 1 tablet (0.25 mg total) by mouth daily as needed for anxiety. 15 tablet 0   busPIRone (BUSPAR) 5 MG tablet Take 1 tablet (5 mg total) by mouth 2 (two) times daily. 60 tablet 1   calcium carbonate (CALCIUM 600) 600 MG TABS tablet Take 600 mg by mouth daily with breakfast.     Loteprednol Etabonate (EYSUVIS) 0.25 % SUSP Apply to eye. (Molly Peterson not taking: Reported on 09/17/2022)     metoprolol succinate (TOPROL-XL) 50 MG 24 hr tablet TAKE ONE TABLET BY MOUTH EVERY DAY IMMEDIATELY FOLLOWING A MEAL 90 tablet 3   Multiple Vitamins-Minerals (CENTRUM SILVER PO) Take 1 tablet by mouth daily.     rosuvastatin (CRESTOR) 10 MG tablet Take 1 tablet (10 mg total) by mouth every other day. 90 tablet 3   triamcinolone cream (KENALOG) 0.1 % Apply 1 Application topically 2 (two) times daily. (Molly Peterson not taking: Reported on 09/17/2022) 15 g 0   No current facility-administered medications for this visit.     Review of Systems  Please see the history of present illness.    (+) Anxiety (+) Shoulder pain  All other systems reviewed and are otherwise negative except as noted above.  Physical Exam    Wt Readings from Last 3 Encounters:  09/17/22 147 lb 4 oz (66.8 kg)  09/12/22 147 lb 4 oz (66.8 kg)  01/04/22 145 lb 6.4 oz (66 kg)   MV:HQION were no vitals filed for this visit.,There is no height or weight on file to calculate BMI.  Constitutional:      Appearance: Healthy appearance. Not in distress.  Neck:     Vascular: JVD normal.  Pulmonary:     Effort: Pulmonary effort is normal.     Breath sounds: No wheezing. No rales. Diminished in the bases Cardiovascular:     Normal rate. Regular rhythm. Normal S1. Normal S2.      Murmurs: There is no murmur.  Edema:    Peripheral edema absent.  Abdominal:      Palpations: Abdomen is soft non tender. There is no hepatomegaly.  Skin:    General: Skin is warm and dry.  Neurological:     General: No focal deficit present.     Mental Status: Alert and oriented to person, place and time.     Cranial Nerves: Cranial nerves are intact.  EKG/LABS/ Recent Cardiac Studies    ECG personally reviewed by me today -sinus rhythm with rate of 60 bpm no acute changes consistent with previous EKG.  Lab Results  Component Value Date   WBC 5.4 09/12/2022   HGB 13.8 09/12/2022   HCT 41.1  09/12/2022   MCV 90 09/12/2022   PLT 214 09/12/2022   Lab Results  Component Value Date   CREATININE 0.78 09/12/2022   BUN 18 09/12/2022   NA 140 09/12/2022   K 4.7 09/12/2022   CL 101 09/12/2022   CO2 29 09/12/2022   Lab Results  Component Value Date   ALT 13 09/12/2022   AST 15 09/12/2022   ALKPHOS 70 09/12/2022   BILITOT 0.4 09/12/2022   Lab Results  Component Value Date   CHOL 165 06/14/2021   HDL 44 06/14/2021   LDLCALC 85 06/14/2021   LDLDIRECT 140.0 07/21/2014   TRIG 211 (H) 06/14/2021   CHOLHDL 3.8 06/14/2021    Lab Results  Component Value Date   HGBA1C 5.2 06/29/2020    Cardiac Studies & Procedures     STRESS TESTS  ECHOCARDIOGRAM STRESS TEST 12/02/2020  Narrative EXERCISE STRESS ECHO REPORT   --------------------------------------------------------------------------------  Molly Peterson Name:   Molly Peterson Chauvin Date of Exam: 11/09/2020 Medical Rec #:  [id number]         Height:       63.0 in Accession #:    [id number]        Weight:       144.5 lb Date of Birth:  15-Aug-1965         BSA:          1.684 m Molly Peterson Age:    54 years          BP:           128/88 mmHg Molly Peterson Gender: F                 HR:           74 bpm. Exam Location:  [facility]  Procedure: Stress Echo  Indications:     R07.9* Chest pain, unspecified; I10 Hypertension  History:         Molly Peterson has prior history of Echocardiogram examinations, most recent  05/16/2017.  Sonographer:     Quentin Ore RDMS, RVT, RDCS Referring Phys:  [id number] Sherol Dade ROSS Diagnosing Phys: Julien Nordmann MD  IMPRESSIONS   1. This is a negative stress echocardiogram for ischemia. 2. This is a low risk study.  FINDINGS  Exam Protocol: The Molly Peterson exercised on a treadmill according to a Bruce protocol.   Molly Peterson Performance: The Molly Peterson exercised for 8 minutes and 5 seconds, achieving 10 METS. The maximum stage achieved was III of the Bruce protocol. The heart rate at peak stress was 148 bpm. The target heart rate was calculated to be 140 bpm. The percentage of maximum predicted heart rate achieved was 89.6 %. The baseline blood pressure was 128/88 mmHg. The blood pressure at peak stress was 158/86 mmHg. The blood pressure response was normal. The Molly Peterson developed shortness of breath and fatigue during the stress exam. The symptoms resolved with rest.  EKG: Resting EKG showed normal sinus rhythm with no abnormal findings. The Molly Peterson developed no abnormal EKG findings during exercise. target heart rate achieved. Heart rate 97 bpm at 3 min in recovery.   2D Echo Findings: The baseline ejection fraction was 60%. The peak ejection fraction at stress was 80%. Baseline regional wall motion abnormalities were not present. There were no stress-induced wall motion abnormalities. This is a negative stress echocardiogram for ischemia.   Julien Nordmann MD Electronically signed on 11/11/2020 at 7:35:37 PM      Final   ECHOCARDIOGRAM  ECHOCARDIOGRAM COMPLETE 05/16/2017  Narrative *[facility] [address] [place], [place] [address] [phone]  ------------------------------------------------------------------- Transthoracic Echocardiography  Molly Peterson:    Molly Peterson, Molly Peterson MR #:       [id number] Study Date: 05/16/2017 Gender:     F Age:        51 Height:     160 cm Weight:     63.7 kg BSA:        1.69 m^2 Pt. Status: Room:  Celine Ahr, M.D. REFERRING    Dietrich Pates, M.D. SONOGRAPHER  Dewitt Hoes, RDCS ATTENDING    Zoila Shutter MD PERFORMING   [facility] Outpatient  cc:  ------------------------------------------------------------------- LV EF: 60% -   65%  ------------------------------------------------------------------- Indications:      Shortness of breath (R06.02).  ------------------------------------------------------------------- History:   Risk factors:  Edema. Palpitation. Former tobacco use. Hypertension. Dyslipidemia.  ------------------------------------------------------------------- Study Conclusions  - Left ventricle: The cavity size was normal. There was mild focal basal hypertrophy of the septum. Systolic function was normal. The estimated ejection fraction was in the range of 60% to 65%. Wall motion was normal; there were no regional wall motion abnormalities. Doppler parameters are consistent with abnormal left ventricular relaxation (grade 1 diastolic dysfunction). The E/e&' ratio is between 8-15, suggesting indeterminate LV filling pressure. - Mitral valve: Mildly thickened leaflets . There was trivial regurgitation. - Left atrium: The atrium was normal in size. - Inferior vena cava: The vessel was normal in size. The respirophasic diameter changes were in the normal range (>= 50%), consistent with normal central venous pressure.  Impressions:  - LVEF 60-65%, mild basal septal hypertrophy, normal wall motion, grade 1 DD, indeterminate LV filling pressure, trivial MR, normal LA size, normal IVC.  ------------------------------------------------------------------- Study data:  No prior study was available for comparison.  Study status:  Routine.  Procedure:  The Molly Peterson reported no pain pre or post test. Transthoracic echocardiography. Image quality was adequate.  Study completion:  There were no complications. Transthoracic echocardiography.  M-mode, complete 2D, 3D,  spectral Doppler, and color Doppler.  Birthdate:  Molly Peterson birthdate: 17-Jul-1965.  Age:  Molly Peterson is 57 yr old.  Sex:  Gender: female. BMI: 24.9 kg/m^2.  Blood pressure:     114/68  Molly Peterson status: Outpatient.  Study date:  Study date: 05/16/2017. Study time: 08:40 AM.  Location:   [facility]  -------------------------------------------------------------------  ------------------------------------------------------------------- Left ventricle:  The cavity size was normal. There was mild focal basal hypertrophy of the septum. Systolic function was normal. The estimated ejection fraction was in the range of 60% to 65%. Wall motion was normal; there were no regional wall motion abnormalities. Doppler parameters are consistent with abnormal left ventricular relaxation (grade 1 diastolic dysfunction). The E/e&' ratio is between 8-15, suggesting indeterminate LV filling pressure.  ------------------------------------------------------------------- Aorta:  Aortic root: The aortic root was normal in size. Ascending aorta: The ascending aorta was normal in size.  ------------------------------------------------------------------- Mitral valve:   Mildly thickened leaflets .  Doppler:  There was trivial regurgitation.    Peak gradient (D): 3 mm Hg.  ------------------------------------------------------------------- Left atrium:  The atrium was normal in size.  ------------------------------------------------------------------- Atrial septum:  No defect or patent foramen ovale was identified.  ------------------------------------------------------------------- Right ventricle:  The cavity size was normal. Wall thickness was normal. Systolic function was normal.  ------------------------------------------------------------------- Pulmonic valve:    The valve appears to be grossly normal. Doppler:  There was no significant  regurgitation.  ------------------------------------------------------------------- Tricuspid valve:   Doppler:  There was no significant regurgitation.  -------------------------------------------------------------------  Pulmonary artery:   The main pulmonary artery was normal-sized.  ------------------------------------------------------------------- Right atrium:  The atrium was normal in size.  ------------------------------------------------------------------- Pericardium:  There was no pericardial effusion.  ------------------------------------------------------------------- Systemic veins: Inferior vena cava: The vessel was normal in size. The respirophasic diameter changes were in the normal range (>= 50%), consistent with normal central venous pressure.  ------------------------------------------------------------------- Measurements  Left ventricle                         Value        Reference LV ID, ED, PLAX chordal        (L)     37.8  mm     43 - 52 LV ID, ES, PLAX chordal                28    mm     23 - 38 LV fx shortening, PLAX chordal (L)     26    %      >=29 LV PW thickness, ED                    6.44  mm     --------- IVS/LV PW ratio, ED            (H)     1.37         <=1.3 Stroke volume, 2D                      82    ml     --------- Stroke volume/bsa, 2D                  48    ml/m^2 --------- LV e&', lateral                         8.16  cm/s   --------- LV E/e&', lateral                       9.88         --------- LV e&', medial                          8.38  cm/s   --------- LV E/e&', medial                        9.62         --------- LV e&', average                         8.27  cm/s   --------- LV E/e&', average                       9.75         ---------  Ventricular septum                     Value        Reference IVS thickness, ED                      8.83  mm     ---------  LVOT  Value         Reference LVOT ID, S                             20    mm     --------- LVOT area                              3.14  cm^2   --------- LVOT peak velocity, S                  105   cm/s   --------- LVOT mean velocity, S                  73.1  cm/s   --------- LVOT VTI, S                            26.2  cm     ---------  Aorta                                  Value        Reference Aortic root ID, ED                     32    mm     --------- Ascending aorta ID, A-P, S             35    mm     ---------  Left atrium                            Value        Reference LA ID, A-P, ES                         24    mm     --------- LA ID/bsa, A-P                         1.42  cm/m^2 <=2.2 LA volume, S                           15.7  ml     --------- LA volume/bsa, S                       9.3   ml/m^2 --------- LA volume, ES, 1-p A4C                 16    ml     --------- LA volume/bsa, ES, 1-p A4C             9.4   ml/m^2 --------- LA volume, ES, 1-p A2C                 15.4  ml     --------- LA volume/bsa, ES, 1-p A2C             9.1   ml/m^2 ---------  Mitral valve                           Value  Reference Mitral E-wave peak velocity            80.6  cm/s   --------- Mitral A-wave peak velocity            75    cm/s   --------- Mitral deceleration time               201   ms     150 - 230 Mitral peak gradient, D                3     mm Hg  --------- Mitral E/A ratio, peak                 1.1          ---------  Systemic veins                         Value        Reference Estimated CVP                          3     mm Hg  ---------  Right ventricle                        Value        Reference TAPSE                                  28.5  mm     --------- RV s&', lateral, S                      10.8  cm/s   ---------  Legend: (L)  and  (H)  mark values outside specified reference range.  ------------------------------------------------------------------- Prepared and  Electronically Authenticated by  Zoila Shutter MD 2018-11-29T10:31:07    MONITORS  LONG TERM MONITOR (3-14 DAYS) 05/26/2018  Narrative SInus rhythm, sinus tachycardia Short bursts of PAT  Self limited No symptoms recorded   CT SCANS  CT CORONARY MORPH W/CTA COR W/SCORE 05/28/2018  Addendum 05/28/2018  4:26 PM ADDENDUM REPORT: 05/28/2018 16:23  HISTORY: CHEST PAIN, FX CAD, PALPITATIONS  EXAM: Cardiac/Coronary  CT  TECHNIQUE: The Molly Peterson was scanned on a [employer].  PROTOCOL: A 120 kV prospective scan was triggered in the descending thoracic aorta at 111 HU's. Axial non-contrast 3 mm slices were carried out through the heart. The data set was analyzed on a dedicated work station and scored using the Agatson method. Gantry rotation speed was 250 msecs and collimation was .6 mm. The 3D data set was reconstructed in 5% intervals of the 67-82 % of the R-R cycle. Diastolic phases were analyzed on a dedicated work station using MPR, MIP and VRT modes. The Molly Peterson received 80 cc of contrast.  FINDINGS: Coronary calcium score: The Molly Peterson's coronary artery calcium score is 0, which places the Molly Peterson in the 0 percentile.  Coronary arteries: Normal coronary origins.  Right dominance.  Right Coronary Artery: No detectable plaque or stenosis. Patent PDA and posterolateral branches.  Left Main Coronary Artery: No detectable plaque or stenosis.  Left Anterior Descending Coronary Artery: No detectable plaque or stenosis. No intramyocardial segments are seen. Patent diagonal branches.  Left Circumflex Artery: No detectable plaque or stenosis. Patent obtuse marginal branches.  Aorta:  Normal size.  No calcifications.  No dissection.  Aortic Valve: No calcifications.  Other findings:  Normal pulmonary vein drainage into the left atrium.  Normal left atrial appendage without a thrombus.  Normal size of the pulmonary artery.  IMPRESSION: 1. Coronary  calcium score of 0. This was 0 percentile for age and sex matched control.  2. Normal coronary origin with right dominance.  3. No evidence of CAD, CADRADS = 0.  4.  Patent foramen ovale with likely left to right shunt.   Electronically Signed By: Weston BrassGayatri  Acharya On: 05/28/2018 16:23  Narrative EXAM: OVER-READ INTERPRETATION  CT CHEST  The following report is an over-read performed by radiologist Dr. Trudie [facility]iel [facility] on 05/28/2018. This over-read does not include interpretation of cardiac or coronary anatomy or pathology. The coronary calcium score/coronary CTA interpretation by the cardiologist is attached.  COMPARISON:  None.  FINDINGS: Aortic atherosclerosis. Within the visualized portions of the thorax there are no suspicious appearing pulmonary nodules or masses, there is no acute consolidative airspace disease, no pleural effusions, no pneumothorax and no lymphadenopathy. Visualized portions of the upper abdomen are unremarkable. There are no aggressive appearing lytic or blastic lesions noted in the visualized portions of the skeleton.  IMPRESSION: 1.  Aortic Atherosclerosis (ICD10-I70.0).  Electronically Signed: By: Trudie Reedaniel  Entrikin M.D. On: 05/28/2018 15:32          Assessment & Plan    1.  Shortness of breath: -Molly Peterson reports shortness of breath occurring over the past month is worse with exertion. -Today Molly Peterson reports that her shortness of breath has reduced but still occurs with heavy exertion. -She denies any indiscretions with salt and is able to lay flat while sleeping. -We will have her complete a 2D echo to evaluate for valvular or structural heart changes.  2.  Essential hypertension: -Molly Peterson's blood pressure today was well-controlled at 128/84 -Continue Toprol-XL 50 mg daily  3.  Hyperlipidemia: -Molly Peterson's last LDL was 85 -Continue Crestor 10 mg daily  4.  Aortic atherosclerosis: -Cardiac CTA completed in  2022 showing aortic atherosclerosis. -Continue Crestor 10 mg daily   5.  Family history of CAD: -She reports family history of premature coronary artery disease on both her mother and father's side. -Continue primary prevention with exercise and diet.  Disposition: Follow-up with Dietrich PatesPaula Ross, MD or APP in 8 months   Medication Adjustments/Labs and Tests Ordered: Current medicines are reviewed at length with the Molly Peterson today.  Concerns regarding medicines are outlined above.   Signed, Napoleon Formick Jr, Leodis RainsErnest Henry, NP 09/27/2022, 12:08 PM [facility]  Note:  This document was prepared using Dragon voice recognition software and may include unintentional dictation errors.

## 2022-09-28 ENCOUNTER — Ambulatory Visit: Payer: BC Managed Care – PPO | Attending: Nurse Practitioner | Admitting: Nurse Practitioner

## 2022-09-28 ENCOUNTER — Encounter: Payer: Self-pay | Admitting: Nurse Practitioner

## 2022-09-28 ENCOUNTER — Other Ambulatory Visit: Payer: Self-pay | Admitting: Nurse Practitioner

## 2022-09-28 VITALS — BP 128/84 | HR 62 | Ht 63.0 in | Wt 147.0 lb

## 2022-09-28 DIAGNOSIS — I10 Essential (primary) hypertension: Secondary | ICD-10-CM

## 2022-09-28 DIAGNOSIS — E785 Hyperlipidemia, unspecified: Secondary | ICD-10-CM | POA: Diagnosis not present

## 2022-09-28 DIAGNOSIS — R0602 Shortness of breath: Secondary | ICD-10-CM | POA: Diagnosis not present

## 2022-09-28 DIAGNOSIS — I700 Atherosclerosis of aorta: Secondary | ICD-10-CM

## 2022-09-28 NOTE — Patient Instructions (Addendum)
Medication Instructions:  Your physician recommends that you continue on your current medications as directed. Please refer to the Current Medication list given to you today. *If you need a refill on your cardiac medications before your next appointment, please call your pharmacy*   Lab Work: None ordered   Testing/Procedures: Your physician has requested that you have an echocardiogram. Echocardiography is a painless test that uses sound waves to create images of your heart. It provides your doctor with information about the size and shape of your heart and how well your heart's chambers and valves are working. This procedure takes approximately one hour. There are no restrictions for this procedure. Please do NOT wear cologne, perfume, aftershave, or lotions (deodorant is allowed). Please arrive 15 minutes prior to your appointment time.   Follow-Up: At [facility], you and your health needs are our priority.  As part of our continuing mission to provide you with exceptional heart care, we have created designated Provider Care Teams.  These Care Teams include your primary Cardiologist (physician) and Advanced Practice Providers (APPs -  Physician Assistants and Nurse Practitioners) who all work together to provide you with the care you need, when you need it.  We recommend signing up for the patient portal called "MyChart".  Sign up information is provided on this After Visit Summary.  MyChart is used to connect with patients for Virtual Visits (Telemedicine).  Patients are able to view lab/test results, encounter notes, upcoming appointments, etc.  Non-urgent messages can be sent to your provider as well.   To learn more about what you can do with MyChart, go to [email or web address].    Your next appointment:   8 month(s)  Provider:   Dietrich Pates, MD     Other Instructions Use pill box to organize meds

## 2022-10-02 ENCOUNTER — Ambulatory Visit: Payer: BC Managed Care – PPO | Attending: Nurse Practitioner

## 2022-10-02 DIAGNOSIS — R0602 Shortness of breath: Secondary | ICD-10-CM

## 2022-10-03 LAB — ECHOCARDIOGRAM COMPLETE
AR max vel: 2.17 cm2
AV Area VTI: 2.25 cm2
AV Area mean vel: 2.14 cm2
AV Mean grad: 3.5 mmHg
AV Peak grad: 6.1 mmHg
Ao pk vel: 1.23 m/s
Area-P 1/2: 3.48 cm2
Calc EF: 58.6 %
S' Lateral: 2.8 cm
Single Plane A2C EF: 57.8 %
Single Plane A4C EF: 59.6 %

## 2022-10-09 ENCOUNTER — Ambulatory Visit: Payer: BC Managed Care – PPO | Admitting: Physician Assistant

## 2022-10-17 DIAGNOSIS — R7309 Other abnormal glucose: Secondary | ICD-10-CM | POA: Diagnosis not present

## 2022-10-29 ENCOUNTER — Ambulatory Visit (INDEPENDENT_AMBULATORY_CARE_PROVIDER_SITE_OTHER): Payer: BC Managed Care – PPO | Admitting: Nurse Practitioner

## 2022-10-29 ENCOUNTER — Encounter: Payer: Self-pay | Admitting: Nurse Practitioner

## 2022-10-29 ENCOUNTER — Telehealth: Payer: BC Managed Care – PPO | Admitting: Nurse Practitioner

## 2022-10-29 VITALS — BP 122/68 | HR 85 | Temp 98.1°F | Resp 16 | Ht 63.0 in | Wt 150.1 lb

## 2022-10-29 DIAGNOSIS — I700 Atherosclerosis of aorta: Secondary | ICD-10-CM

## 2022-10-29 DIAGNOSIS — Z1231 Encounter for screening mammogram for malignant neoplasm of breast: Secondary | ICD-10-CM

## 2022-10-29 DIAGNOSIS — I10 Essential (primary) hypertension: Secondary | ICD-10-CM

## 2022-10-29 DIAGNOSIS — Z7689 Persons encountering health services in other specified circumstances: Secondary | ICD-10-CM | POA: Diagnosis not present

## 2022-10-29 DIAGNOSIS — F419 Anxiety disorder, unspecified: Secondary | ICD-10-CM | POA: Diagnosis not present

## 2022-10-29 NOTE — Assessment & Plan Note (Signed)
Patient currently followed by cardiology and is on rosuvastatin.  Continue medication as prescribed

## 2022-10-29 NOTE — Assessment & Plan Note (Signed)
Patient currently maintained on metoprolol 50 mg daily.  Blood pressure under well-controlled continue medication as prescribed

## 2022-10-29 NOTE — Assessment & Plan Note (Signed)
Patient tried buspirone 5 mg twice daily for a week states she felt snacking on it and discontinued it.  Recommend patient taking a snack prior to eating and see if it offsets that at a nauseous feeling she was describing.

## 2022-10-29 NOTE — Assessment & Plan Note (Signed)
Review of EMR done

## 2022-10-29 NOTE — Progress Notes (Signed)
Established Patient Office Visit  Subjective   Patient ID: Molly Peterson, female    DOB: 21-Dec-1965  Age: 57 y.o. MRN: [id number]  Chief Complaint  Patient presents with   Establish Care    Transferring from Dr. Selena Batten     HPI  Anxiety: she was written buspar. States that she took it for a week.  States she had any kind of nauseous feeling after would have to eat.  She stopped taking the medication after 1 week  HTN: Patient currently maintained on metoprolol 50 mg 1 tablet daily.  She is followed by cardiology Dr. Dietrich Pates.  HLD: Patient is currently maintained on rosuvastatin 10 mg daily.  She is followed by cardiology, Dietrich Pates MD and Robin Searing, NP  Transfer of care: last seen by Gweneth Dimitri 10/25/2020. Last CPE  Tdap: Needs updating.  Will get it at her employer at no cost Flu: normally take it but did not this past year Covid:pfizer Pna: Too young Shingles: information discussed in office  Colonoscopy: 06/28/2021, repeat in 7 years Mammogram: 12/01/2021 with [facility] Pap smear: 2018? Up to date patient had an abdominal hysterectomy no longer does Pap smears just vaginal exams vaginal exams    Review of Systems  Constitutional:  Negative for chills and fever.  Respiratory:  Negative for shortness of breath.   Cardiovascular:  Negative for chest pain and leg swelling.  Gastrointestinal:  Negative for abdominal pain, blood in stool, constipation, diarrhea, nausea and vomiting.       BM daily  Genitourinary:  Negative for dysuria and hematuria.  Neurological:  Negative for tingling and headaches.  Psychiatric/Behavioral:  Negative for hallucinations and suicidal ideas.       Objective:     BP 122/68   Pulse 85   Temp 98.1 F (36.7 C)   Resp 16   Ht 5\' 3"  (1.6 m)   Wt 150 lb 2 oz (68.1 kg)   SpO2 99%   BMI 26.59 kg/m  BP Readings from Last 3 Encounters:  10/29/22 122/68  09/28/22 128/84  09/12/22 124/76   Wt Readings from Last 3 Encounters:   10/29/22 150 lb 2 oz (68.1 kg)  09/28/22 147 lb (66.7 kg)  09/17/22 147 lb 4 oz (66.8 kg)      Physical Exam Vitals and nursing note reviewed.  Constitutional:      Appearance: Normal appearance.  HENT:     Right Ear: Tympanic membrane, ear canal and external ear normal.     Left Ear: Tympanic membrane, ear canal and external ear normal.     Mouth/Throat:     Mouth: Mucous membranes are moist.     Pharynx: Oropharynx is clear.  Eyes:     Extraocular Movements: Extraocular movements intact.     Pupils: Pupils are equal, round, and reactive to light.  Cardiovascular:     Rate and Rhythm: Normal rate and regular rhythm.     Heart sounds: Normal heart sounds.  Pulmonary:     Effort: Pulmonary effort is normal.     Breath sounds: Normal breath sounds.  Musculoskeletal:     Right lower leg: No edema.     Left lower leg: No edema.  Lymphadenopathy:     Cervical: No cervical adenopathy.  Neurological:     General: No focal deficit present.     Mental Status: She is alert.     Deep Tendon Reflexes:     Reflex Scores:      Bicep  reflexes are 2+ on the right side and 2+ on the left side.      Patellar reflexes are 2+ on the right side and 2+ on the left side.    Comments: Bilateral upper and lower extremity strength 5/5      No results found for any visits on 10/29/22.    The 10-year ASCVD risk score (Arnett DK, et al., 2019) is: 6.3%    Assessment & Plan:   Problem List Items Addressed This Visit       Cardiovascular and Mediastinum   Essential hypertension - Primary    Patient currently maintained on metoprolol 50 mg daily.  Blood pressure under well-controlled continue medication as prescribed      Aortic atherosclerosis [facility])    Patient currently followed by cardiology and is on rosuvastatin.  Continue medication as prescribed        Other   Anxiety    Patient tried buspirone 5 mg twice daily for a week states she felt snacking on it and discontinued it.   Recommend patient taking a snack prior to eating and see if it offsets that at a nauseous feeling she was describing.      Establishing care with new doctor, encounter for    Review of EMR done      Other Visit Diagnoses     Screening mammogram for breast cancer       Relevant Orders   MM 3D SCREENING MAMMOGRAM BILATERAL BREAST       Return in about 8 months (around 07/01/2023) for CPE and Labs.    Audria Nine, NP

## 2022-10-29 NOTE — Patient Instructions (Addendum)
Nice to see you today Try taking the buspar with a snack Follow up with me in 6-8 months for a physical and labs  Call the [facility] to schedule your mammogram

## 2022-11-08 ENCOUNTER — Other Ambulatory Visit: Payer: Self-pay

## 2022-11-08 ENCOUNTER — Other Ambulatory Visit: Payer: Self-pay | Admitting: Internal Medicine

## 2022-11-08 MED ORDER — ROSUVASTATIN CALCIUM 10 MG PO TABS: 10.0000 mg | ORAL_TABLET | ORAL | 3 refills | Status: DC

## 2022-11-08 MED ORDER — METOPROLOL SUCCINATE ER 50 MG PO TB24: ORAL_TABLET | ORAL | 3 refills | Status: DC

## 2022-11-08 NOTE — Telephone Encounter (Signed)
Pt's medications were sent to pt's pharmacy as requested. Confirmation received.  

## 2022-11-13 DIAGNOSIS — Z13820 Encounter for screening for osteoporosis: Secondary | ICD-10-CM | POA: Diagnosis not present

## 2022-11-13 DIAGNOSIS — Z01419 Encounter for gynecological examination (general) (routine) without abnormal findings: Secondary | ICD-10-CM | POA: Diagnosis not present

## 2022-11-13 DIAGNOSIS — Z6827 Body mass index (BMI) 27.0-27.9, adult: Secondary | ICD-10-CM | POA: Diagnosis not present

## 2022-11-17 DIAGNOSIS — R7309 Other abnormal glucose: Secondary | ICD-10-CM | POA: Diagnosis not present

## 2022-12-03 DIAGNOSIS — L814 Other melanin hyperpigmentation: Secondary | ICD-10-CM | POA: Diagnosis not present

## 2022-12-03 DIAGNOSIS — L821 Other seborrheic keratosis: Secondary | ICD-10-CM | POA: Diagnosis not present

## 2022-12-03 DIAGNOSIS — L820 Inflamed seborrheic keratosis: Secondary | ICD-10-CM | POA: Diagnosis not present

## 2022-12-03 DIAGNOSIS — L565 Disseminated superficial actinic porokeratosis (DSAP): Secondary | ICD-10-CM | POA: Diagnosis not present

## 2022-12-12 ENCOUNTER — Ambulatory Visit
Admission: RE | Admit: 2022-12-12 | Discharge: 2022-12-12 | Disposition: A | Discharge status: Home / Self Care | Payer: BC Managed Care – PPO | Source: Ambulatory Visit | Attending: Nurse Practitioner | Admitting: Nurse Practitioner

## 2022-12-12 DIAGNOSIS — Z1231 Encounter for screening mammogram for malignant neoplasm of breast: Secondary | ICD-10-CM | POA: Diagnosis not present

## 2022-12-17 DIAGNOSIS — R7309 Other abnormal glucose: Secondary | ICD-10-CM | POA: Diagnosis not present

## 2023-01-17 DIAGNOSIS — R7309 Other abnormal glucose: Secondary | ICD-10-CM | POA: Diagnosis not present

## 2023-02-17 DIAGNOSIS — R7309 Other abnormal glucose: Secondary | ICD-10-CM | POA: Diagnosis not present

## 2023-02-21 ENCOUNTER — Encounter: Payer: Self-pay | Admitting: Adult Health

## 2023-02-21 ENCOUNTER — Ambulatory Visit (INDEPENDENT_AMBULATORY_CARE_PROVIDER_SITE_OTHER): Payer: Self-pay | Admitting: Adult Health

## 2023-02-21 ENCOUNTER — Other Ambulatory Visit: Payer: Self-pay

## 2023-02-21 VITALS — BP 100/70 | HR 70 | Temp 96.7°F | Ht 63.0 in | Wt 146.0 lb

## 2023-02-21 DIAGNOSIS — R051 Acute cough: Secondary | ICD-10-CM

## 2023-02-21 LAB — POC COVID19 BINAXNOW: SARS Coronavirus 2 Ag: NEGATIVE

## 2023-02-21 NOTE — Addendum Note (Signed)
Addended by: Johnna Acosta on: 02/21/2023 01:35 PM   Modules accepted: Orders

## 2023-02-21 NOTE — Progress Notes (Addendum)
[facility] [address] [place], [place] [address]   Office Visit Note  Patient Name: Molly Peterson Date of Birth [id number]  Medical Record number [id number]  Date of Service: 02/21/2023  Chief Complaint  Patient presents with  . sick visit    Patient states she has been feeling fatigued for the past 5 days or so. She has a slightly sore throat but states she "always has sore throat" potentially due to allergies. Denies fever/SOB. 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     HPI Pt is here for a sick visit. Patient reports she was exposed to covid by two students who have tested positive for covid.  She reports she has been coughing.  She has needed her inhaler, which is the first time in a long time.  She reports her neighbors are building a pool and cutting rock for it.  So there has been clouds of dust.  She did have some sore throat as well. Which she thinks may be from the coughing.  She did take Amoxicillin for one week, completed on 02/13/23 for dental infection.    Current Medication:  Outpatient Encounter Medications as of 02/21/2023  Medication Sig  . albuterol (VENTOLIN HFA) 108 (90 Base) MCG/ACT inhaler Inhale 2 puffs into the lungs every 6 (six) hours as needed for wheezing or shortness of breath.  . ALPRAZolam (XANAX) 0.25 MG tablet Take 1 tablet (0.25 mg total) by mouth daily as needed for anxiety.  Marland Kitchen estradiol (ESTRACE) 0.1 MG/GM vaginal cream Place 0.5 g vaginally as needed.  . metoprolol succinate (TOPROL-XL) 50 MG 24 hr tablet TAKE ONE TABLET BY MOUTH EVERY DAY IMMEDIATELY FOLLOWING A MEAL  . rosuvastatin (CRESTOR) 10 MG tablet Take 1 tablet (10 mg total) by mouth every other day.  . [DISCONTINUED] busPIRone (BUSPAR) 5 MG tablet Take 1 tablet (5 mg total) by mouth 2 (two) times daily.  . [DISCONTINUED] calcium carbonate (CALCIUM 600) 600 MG TABS tablet Take 600 mg by mouth daily with breakfast.  . [DISCONTINUED]  Multiple Vitamins-Minerals (CENTRUM SILVER PO) Take 1 tablet by mouth daily.  . [DISCONTINUED] triamcinolone cream (KENALOG) 0.1 % Apply 1 Application topically 2 (two) times daily.   No facility-administered encounter medications on file as of 02/21/2023.      Medical History: Past Medical History:  Diagnosis Date  . Acute pharyngitis   . ANXIETY 09/18/2006   on PRN meds for situational anxiety  . Chronic kidney disease    hx kidney stones  . DEPRESSION 09/18/2006  . DERMATITIS, SEBORRHEIC NEC 01/06/2007  . Edema    symptom  . Flatulence, eructation, and gas pain   . Hyperlipidemia    on meds  . HYPERTENSION 09/18/2006   on meds  . Osteopenia   . Palpitations 09/18/2006  . REACTION, ACUTE STRESS W/EMOTIONAL DSTURB 12/23/2006  . Seasonal allergies   . Unspecified constipation   . Urinary tract infection, site not specified      Vital Signs: BP 100/70   Pulse 70   Temp (!) 96.7 F (35.9 C)   Ht 5\' 3"  (1.6 m)   Wt 146 lb (66.2 kg)   SpO2 98%   BMI 25.86 kg/m    Review of Systems  Constitutional:  Negative for chills, fatigue and fever.  HENT:  Positive for sore throat.   Eyes:  Negative for pain and itching.  Respiratory:  Positive for cough.     Physical Exam  Vitals reviewed.  Constitutional:      Appearance: Normal appearance.  HENT:     Head: Normocephalic.     Right Ear: Tympanic membrane and ear canal normal.     Left Ear: Tympanic membrane normal.     Nose: Nose normal.     Mouth/Throat:     Mouth: Mucous membranes are moist.  Eyes:     Pupils: Pupils are equal, round, and reactive to light.  Pulmonary:     Effort: Pulmonary effort is normal.     Breath sounds: Normal breath sounds.  Lymphadenopathy:     Cervical: No cervical adenopathy.  Neurological:     Mental Status: She is alert.   Results for orders placed or performed in visit on 02/21/23 (from the past 24 hour(s))  POC COVID-19     Status: Normal   Collection Time: 02/21/23  1:34  PM  Result Value Ref Range   SARS Coronavirus 2 Ag Negative Negative    Assessment/Plan: 1. Acute cough Continue to use inhaler as needed.  Follow up via MyChart messenger if symptoms fail to improve or may return to clinic as needed for worsening symptoms.       General Counseling: Molly Peterson understanding of the findings of todays visit and agrees with plan of treatment. I have discussed any further diagnostic evaluation that may be needed or ordered today. We also reviewed her medications today. she has been encouraged to call the office with any questions or concerns that should arise related to todays visit.   Orders Placed This Encounter  Procedures  . POC COVID-19    No orders of the defined types were placed in this encounter.   Time spent:20 Minutes    Molly Peterson AGNP-C Nurse Practitioner

## 2023-03-19 DIAGNOSIS — R7309 Other abnormal glucose: Secondary | ICD-10-CM | POA: Diagnosis not present

## 2023-04-02 ENCOUNTER — Ambulatory Visit (INDEPENDENT_AMBULATORY_CARE_PROVIDER_SITE_OTHER): Payer: BC Managed Care – PPO | Admitting: Nurse Practitioner

## 2023-04-02 ENCOUNTER — Encounter: Payer: Self-pay | Admitting: Nurse Practitioner

## 2023-04-02 VITALS — BP 128/98 | HR 66 | Temp 98.0°F | Ht 63.0 in | Wt 151.2 lb

## 2023-04-02 DIAGNOSIS — R1084 Generalized abdominal pain: Secondary | ICD-10-CM

## 2023-04-02 DIAGNOSIS — K2900 Acute gastritis without bleeding: Secondary | ICD-10-CM | POA: Diagnosis not present

## 2023-04-02 LAB — POCT URINALYSIS DIPSTICK
Appearance: NEGATIVE
Bilirubin, UA: NEGATIVE
Blood, UA: NEGATIVE
Glucose, UA: NEGATIVE
Ketones, UA: NEGATIVE
Leukocytes, UA: NEGATIVE
Nitrite, UA: NEGATIVE
Protein, UA: NEGATIVE
Spec Grav, UA: 1.02 (ref 1.010–1.025)
Urobilinogen, UA: 0.2 U/dL
pH, UA: 6 (ref 5.0–8.0)

## 2023-04-02 MED ORDER — SUCRALFATE 1 G PO TABS
1.0000 g | ORAL_TABLET | Freq: Three times a day (TID) | ORAL | 0 refills | Status: DC
Start: 2023-04-02 — End: 2024-02-03

## 2023-04-02 MED ORDER — OMEPRAZOLE 20 MG PO CPDR
20.0000 mg | DELAYED_RELEASE_CAPSULE | Freq: Every day | ORAL | 0 refills | Status: DC
Start: 2023-04-02 — End: 2023-09-04

## 2023-04-02 NOTE — Progress Notes (Signed)
Acute Office Visit  Subjective:     Patient ID: Molly Peterson, female    DOB: [birth year], 57 y.o.   MRN: [id number]  Chief Complaint  Patient presents with   Abdominal Pain    Pt complains of pain for 4 days. Pt stated last night she had burning feeling in stomach area and could not sleep due to pain. Pt states stomach has felt hard and bloated. Pt has taken OTC Pepto bismol. Pt states she started feeling the stomach pain after being prescribed keflex for infection.      Patient is in today for abdominal pain with a history of HTN, HLD,   States that Thursday it started feeling uncomfrtable after dinner. State that she went to bed and felt sore and burning, it woke her up. States that it did not go away. States Friday it was intermittent, felt distended and swollen. States that her gas has been stinky. She had tried yougurt. State that she did do augmentin and then switched to keflex. State that she has tried pepto bismol that did help. States that on Sunday she felt somewhat ok and then the symptoms started back pain  Keflex started on 03/15/2023 and then bm the first week of October.  States they wanted to complete a Z-Pak prior to her next dental procedure  Review of Systems  Constitutional:  Negative for chills and fever.  Respiratory:  Negative for shortness of breath.   Cardiovascular:  Negative for chest pain.  Gastrointestinal:  Positive for abdominal pain and constipation. Negative for diarrhea, nausea and vomiting.        Objective:    BP (!) 128/98   Pulse 66   Temp 98 F (36.7 C) (Oral)   Ht 5\' 3"  (1.6 m)   Wt 151 lb 3.2 oz (68.6 kg)   SpO2 96%   BMI 26.78 kg/m  BP Readings from Last 3 Encounters:  04/02/23 (!) 128/98  02/21/23 100/70  10/29/22 122/68   Wt Readings from Last 3 Encounters:  04/02/23 151 lb 3.2 oz (68.6 kg)  02/21/23 146 lb (66.2 kg)  10/29/22 150 lb 2 oz (68.1 kg)   SpO2 Readings from Last 3 Encounters:  04/02/23 96%  02/21/23 98%   10/29/22 99%      Physical Exam Vitals and nursing note reviewed.  Constitutional:      Appearance: Normal appearance.  Cardiovascular:     Rate and Rhythm: Normal rate and regular rhythm.     Heart sounds: Normal heart sounds.  Pulmonary:     Effort: Pulmonary effort is normal.     Breath sounds: Normal breath sounds.  Abdominal:     General: Bowel sounds are normal. There is distension.     Palpations: There is no mass.     Tenderness: There is abdominal tenderness.     Hernia: No hernia is present.  Neurological:     Mental Status: She is alert.     Results for orders placed or performed in visit on 04/02/23  POCT urinalysis dipstick  Result Value Ref Range   Color, UA Yellow    Clarity, UA clear    Glucose, UA Negative Negative   Bilirubin, UA neg    Ketones, UA neg    Spec Grav, UA 1.020 1.010 - 1.025   Blood, UA neg    pH, UA 6.0 5.0 - 8.0   Protein, UA Negative Negative   Urobilinogen, UA 0.2 0.2 or 1.0 E.U./dL   Nitrite, UA neg  Leukocytes, UA Negative Negative   Appearance neg    Odor          Assessment & Plan:   Problem List Items Addressed This Visit       Digestive   Acute superficial gastritis without hemorrhage    Tenderness to the epigastrium.  Will check basic blood work inclusive of CBC, CMP, lipase and H. pylori.  Treat with Carafate 1 g 4 times daily and omeprazole 20 mg daily.      Relevant Medications   sucralfate (CARAFATE) 1 g tablet   omeprazole (PRILOSEC) 20 MG capsule     Other   Generalized abdominal pain - Primary    Check labs inclusive of CBC, CMP, lipase, UA, H. pylori.  Will treat for gastritis no indication for CT of the abdomen pelvis at this juncture.  Did review patient's ultrasound of the abdomen and CT done several years ago      Relevant Orders   H. pylori breath test   POCT urinalysis dipstick (Completed)   Comprehensive metabolic panel   CBC   Lipase    Meds ordered this encounter  Medications    sucralfate (CARAFATE) 1 g tablet    Sig: Take 1 tablet (1 g total) by mouth 4 (four) times daily -  with meals and at bedtime.    Dispense:  40 tablet    Refill:  0    Order Specific Question:   Supervising Provider    Answer:   Roxy Manns A [[id number]]   omeprazole (PRILOSEC) 20 MG capsule    Sig: Take 1 capsule (20 mg total) by mouth daily.    Dispense:  30 capsule    Refill:  0    Order Specific Question:   Supervising Provider    Answer:   TOWER, MARNE A [[id number]]    Return if symptoms worsen or fail to improve.  Audria Nine, NP

## 2023-04-02 NOTE — Assessment & Plan Note (Signed)
Check labs inclusive of CBC, CMP, lipase, UA, H. pylori.  Will treat for gastritis no indication for CT of the abdomen pelvis at this juncture.  Did review patient's ultrasound of the abdomen and CT done several years ago

## 2023-04-02 NOTE — Assessment & Plan Note (Signed)
Tenderness to the epigastrium.  Will check basic blood work inclusive of CBC, CMP, lipase and H. pylori.  Treat with Carafate 1 g 4 times daily and omeprazole 20 mg daily.

## 2023-04-02 NOTE — Patient Instructions (Signed)
Nice to see you today I will be in touch with the labs once I have them Follow up with me if you do not improve

## 2023-04-03 ENCOUNTER — Encounter: Payer: Self-pay | Admitting: Nurse Practitioner

## 2023-04-03 LAB — CBC
Hematocrit: 41.9 % (ref 34.0–46.6)
Hemoglobin: 13.6 g/dL (ref 11.1–15.9)
MCH: 29.1 pg (ref 26.6–33.0)
MCHC: 32.5 g/dL (ref 31.5–35.7)
MCV: 90 fL (ref 79–97)
Platelets: 250 10*3/uL (ref 150–450)
RBC: 4.68 x10E6/uL (ref 3.77–5.28)
RDW: 12.5 % (ref 11.7–15.4)
WBC: 7.8 10*3/uL (ref 3.4–10.8)

## 2023-04-03 LAB — COMPREHENSIVE METABOLIC PANEL
ALT: 15 [IU]/L (ref 0–32)
AST: 18 [IU]/L (ref 0–40)
Albumin: 4.3 g/dL (ref 3.8–4.9)
Alkaline Phosphatase: 72 [IU]/L (ref 44–121)
BUN/Creatinine Ratio: 24 — ABNORMAL HIGH (ref 9–23)
BUN: 19 mg/dL (ref 6–24)
Bilirubin Total: 0.4 mg/dL (ref 0.0–1.2)
CO2: 21 mmol/L (ref 20–29)
Calcium: 9.4 mg/dL (ref 8.7–10.2)
Chloride: 100 mmol/L (ref 96–106)
Creatinine, Ser: 0.79 mg/dL (ref 0.57–1.00)
Globulin, Total: 2.7 g/dL (ref 1.5–4.5)
Glucose: 89 mg/dL (ref 70–99)
Potassium: 4.4 mmol/L (ref 3.5–5.2)
Sodium: 138 mmol/L (ref 134–144)
Total Protein: 7 g/dL (ref 6.0–8.5)
eGFR: 87 mL/min/{1.73_m2} (ref >59)

## 2023-04-03 LAB — H. PYLORI BREATH TEST: H. pylori Breath Test: NOT DETECTED

## 2023-04-03 LAB — LIPASE: Lipase: 43 U/L (ref 14–72)

## 2023-04-19 DIAGNOSIS — R7309 Other abnormal glucose: Secondary | ICD-10-CM | POA: Diagnosis not present

## 2023-04-22 ENCOUNTER — Ambulatory Visit (INDEPENDENT_AMBULATORY_CARE_PROVIDER_SITE_OTHER): Payer: BC Managed Care – PPO | Admitting: Gastroenterology

## 2023-04-22 ENCOUNTER — Encounter: Payer: Self-pay | Admitting: Gastroenterology

## 2023-04-22 VITALS — BP 103/70 | HR 75 | Ht 63.0 in | Wt 151.0 lb

## 2023-04-22 DIAGNOSIS — K5900 Constipation, unspecified: Secondary | ICD-10-CM | POA: Diagnosis not present

## 2023-04-22 DIAGNOSIS — R1084 Generalized abdominal pain: Secondary | ICD-10-CM

## 2023-04-22 MED ORDER — DICYCLOMINE HCL 10 MG PO CAPS: 10.0000 mg | ORAL_CAPSULE | Freq: Three times a day (TID) | ORAL | 11 refills | Status: DC

## 2023-04-22 NOTE — Patient Instructions (Signed)
We have sent the following medications to your pharmacy for you to pick up at your convenience: dicyclomine.   Please message our office in 2 weeks if your symptoms are not better.   The [facility] providers would like to encourage you to use [facility] to communicate with providers for non-urgent requests or questions.  Due to long hold times on the telephone, sending your provider a message by [facility] may be a faster and more efficient way to get a response.  Please allow 48 business hours for a response.  Please remember that this is for non-urgent requests.   Thank you for choosing me and [facility].  Venita Lick. Pleas Koch., MD., Clementeen Graham

## 2023-04-22 NOTE — Progress Notes (Signed)
Assessment     Suspected IBS-C leading to abdominal pain. Constipation, incomplete fecal evacuation Mild colon diverticulosis S/P cholecystectomy, appendectomy, hysterectomy Personal history of sessile serrated adenomatous, tubular adenomatous colon polyps   Recommendations    SlimFast every day for next 1-2 weeks then prn Dicyclomine 10 mg po tid prn abdominal pain Contact us in 4 weeks if symptoms have not significantly improved consider CT AP and Linzess or Trulance or Motegrity Surveillance colonoscopy in Jan 2030   HPI    This is a 57 year old female who relates worsening problems with generalized abdominal pain and tenderness.  Symptoms are bothersome during the day and occasionally wake her at night. Symptoms are not related to meals.  She continues to have constipation and incomplete fecal evacuation.  Often her stools are loose however she still senses incomplete fecal evacuation.  She took 4-5 week course of antibiotics for a dental infection prior to her abdominal pain worsening.  She is no longer on antibiotics and her dental infection has resolved.  Over the years she has tried several over-the-counter laxatives including MiraLAX with no consistent results.  She has recently noted that a specific type of Slimfast shake leads to a predictable bowel movement. CT AP in Jan 2022 showed diverticulosis and aortic atherosclerosis. Denies weight loss, change in stool caliber, melena, hematochezia, nausea, vomiting, dysphagia, reflux symptoms, chest pain.   Colonoscopy Jan 2023 - External hemorrhoids found on perianal exam.  - Two 7 to 8 mm polyps in the ascending colon, removed with a cold snare. Resected and retrieved.  - Mild left colon diverticulosis.  - Internal hemorrhoids.  - The examination was otherwise normal on direct and retroflexion views   Labs / Imaging       Latest Ref Rng & Units 04/02/2023    1:25 PM 09/12/2022   10:47 AM 06/14/2021    9:41 AM  Hepatic  Function  Total Protein 6.0 - 8.5 g/dL 7.0  6.5  6.5   Albumin 3.8 - 4.9 g/dL 4.3  4.2  4.4   AST 0 - 40 IU/L 18  15  14    ALT 0 - 32 IU/L 15  13  13    Alk Phosphatase 44 - 121 IU/L 72  70  57   Total Bilirubin 0.0 - 1.2 mg/dL 0.4  0.4  0.3        Latest Ref Rng & Units 04/02/2023    1:25 PM 09/12/2022   10:47 AM 08/14/2021    2:54 PM  CBC  WBC 3.4 - 10.8 x10E3/uL 7.8  5.4  6.4   Hemoglobin 11.1 - 15.9 g/dL 53.6  64.4  03.4   Hematocrit 34.0 - 46.6 % 41.9  41.1  41.0   Platelets 150 - 450 x10E3/uL 250  214  220    Current Medications, Allergies, Past Medical History, Past Surgical History, Family History and Social History were reviewed in [employer] record.   Physical Exam: General: Well developed, well nourished, no acute distress Head: Normocephalic and atraumatic Eyes: Sclerae anicteric, EOMI Ears: Normal auditory acuity Mouth: No deformities or lesions noted Lungs: Clear throughout to auscultation Heart: Regular rate and rhythm; No murmurs, rubs or bruits Abdomen: Soft, mild generalized tenderness and non distended. No masses, hepatosplenomegaly or hernias noted. Normal Bowel sounds Rectal: Not done Musculoskeletal: Symmetrical with no gross deformities  Pulses:  Normal pulses noted Extremities: No edema or deformities noted Neurological: Alert oriented x 4, grossly nonfocal Psychological:  Alert and  cooperative. Normal mood and affect   Lorraine Terriquez T. Russella Dar, MD 04/22/2023, 3:21 PM

## 2023-05-19 DIAGNOSIS — R7309 Other abnormal glucose: Secondary | ICD-10-CM | POA: Diagnosis not present

## 2023-05-20 ENCOUNTER — Telehealth: Payer: Self-pay | Admitting: Gastroenterology

## 2023-05-20 NOTE — Telephone Encounter (Signed)
Patient called and stated that she is in sever pain and is hoping be seen sooner. Patient is requesting a call back. Please advise.

## 2023-05-21 NOTE — Telephone Encounter (Signed)
Sorry to hear that her symptoms are adequately controlled. See 04/22/2023 office note. Increase dicyclomine to 20 mg tid prn abdominal pain. If her constipation is not adequately controlled with Miralax start Linzess 145 mcg every day. Keep appt with Dr. Doy Hutching as scheduled.

## 2023-05-21 NOTE — Telephone Encounter (Signed)
The pt has been advised via My Chart- she does view her messages. Will await response

## 2023-05-21 NOTE — Telephone Encounter (Signed)
Spoke with the pt over the phone and discussed that she has a 2 year history of abd pain and cramps.  She states the pain was severe yesterday and she had to leave work.  She is taking bentyl 10 mg three times daily and carafate 1 g four times daily with no relief in symptoms.  She is upset that "Dr Russella Dar keeps guessing what is wrong with her and giving her medications that do not work"  She has an appt with Dr Doy Hutching on 07/09/23 and has been added to the waitlist.  She was told that if she develops severe abd pain again she should go to UC or ED for eval.  She states that "Dr Russella Dar has already cost her thousands of dollars and going to the ED or UC will be even more money"  She tells me that she hopes we all get severe abd pain and can not get help and hangs up.

## 2023-06-03 ENCOUNTER — Encounter: Payer: Self-pay | Admitting: Nurse Practitioner

## 2023-06-03 DIAGNOSIS — F419 Anxiety disorder, unspecified: Secondary | ICD-10-CM

## 2023-06-03 DIAGNOSIS — I700 Atherosclerosis of aorta: Secondary | ICD-10-CM

## 2023-06-03 DIAGNOSIS — I10 Essential (primary) hypertension: Secondary | ICD-10-CM

## 2023-06-06 NOTE — Addendum Note (Signed)
Addended by: Eden Emms on: 06/06/2023 04:50 PM   Modules accepted: Orders

## 2023-06-14 DIAGNOSIS — R8290 Unspecified abnormal findings in urine: Secondary | ICD-10-CM | POA: Diagnosis not present

## 2023-06-14 DIAGNOSIS — N952 Postmenopausal atrophic vaginitis: Secondary | ICD-10-CM | POA: Diagnosis not present

## 2023-06-14 DIAGNOSIS — N760 Acute vaginitis: Secondary | ICD-10-CM | POA: Diagnosis not present

## 2023-06-19 DIAGNOSIS — R7309 Other abnormal glucose: Secondary | ICD-10-CM | POA: Diagnosis not present

## 2023-07-08 NOTE — Progress Notes (Unsigned)
[facility] Return Visit   Referring Provider Eden Emms, NP [address] [place],  [place] [address]  Primary Care Provider Toney Reil, Genene Churn, NP  Patient Profile: Molly Peterson is a 58 y.o. female who returns to the [facility] for follow-up of the problem(s) noted below.  Problem List: Suspected IBS-C leading to abdominal pain. Constipation, incomplete fecal evacuation Mild colon diverticulosis S/P cholecystectomy, appendectomy, hysterectomy Personal history of sessile serrated adenomatous, tubular adenomatous colon polyps Partial pancreas divisum on CT imaging   History of Present Illness   Molly Peterson was last seen in the GI office 04/22/2003 by Dr. Russella Dar   Current GI Meds  Dicyclomine 10-20 mg p.o. 3 times daily as needed abdominal pain -not taking as she states it does not help Omeprazole 20 mg orally daily Sucralfate 1 g p.o. 4 times daily  Interval History  Molly Peterson returns to the office today for follow-up of irritable bowel syndrome  She informs me that her gastrointestinal problems began in 2020 when she was experiencing significant abdominal pain During that evaluation she was found to have gallbladder polyps prompting cholecystectomy Since that time she has experienced irregular bowel habits with infrequent stools that are typically hard soft/thick in nature but not diarrhea  Currently reports skipping several days in a row going to the bathroom When she is constipated she will take over-the-counter stool softeners as well as a Slim fast shake States that Slim fast shake contains a high amount of fiber and seems to prompt bowel movements Stools are described as being thick in consistency and can then occur several times a day; stools are never hard No blood or mucus in her stool Endorses some straining with defecation  Reports consuming a high amount of fiber in her diet Eats oatmeal, yogurt, berries, bananas,  oranges, whole-wheat bread, turnip greens and beans Stays well-hydrated with water Has also tried fiber supplements that have not been beneficial  At the time of her last appointment was advised to increase dicyclomine from 10 mg p.o. 3 times daily to 20 mg p.o. 3 times daily She does not feel that this has been beneficial  Describes upper abdominal pain that can awaken her from sleep 3-4 nights a week Reviewed that this could represent constipation  Reports various sources of stress related to her busy job at [employer] and caring for her adult son with narcolepsy  Last colonoscopy: 06/2021 - Two 7-8 mm polyps in [facility], left colon diverticulosis, IH/EH Last endoscopy: None  Last Abd CT/CTE/MRE: CTAP 06/2020 - normal  GI Review of Symptoms Significant for abdominal pain, constipation. Otherwise negative.  General Review of Systems  Review of systems is significant for the pertinent positives and negatives as listed per the HPI.  Full ROS is otherwise negative.  Past Medical History   Past Medical History:  Diagnosis Date   Acute pharyngitis    ANXIETY 09/18/2006   on PRN meds for situational anxiety   Chronic kidney disease    hx kidney stones   DEPRESSION 09/18/2006   DERMATITIS, SEBORRHEIC NEC 01/06/2007   Edema    symptom   Flatulence, eructation, and gas pain    Hyperlipidemia    on meds   HYPERTENSION 09/18/2006   on meds   Osteopenia    Palpitations 09/18/2006   REACTION, ACUTE STRESS W/EMOTIONAL DSTURB 12/23/2006   Seasonal allergies    Unspecified constipation    Urinary tract infection, site not specified      Past  Surgical History   Past Surgical History:  Procedure Laterality Date   ABDOMINAL HYSTERECTOMY     partial-still has ovaries   APPENDECTOMY  unknown   CESAREAN SECTION     x 2   CHOLECYSTECTOMY     COLONOSCOPY  2017   MS-MAC-goor prep-int/ext hem/SSP/adenoma-5 yr recall   CYSTOSCOPY  2020   POLYPECTOMY  2017   SSP/adenoma   TONSILLECTOMY   unknown     Allergies and Medications   Allergies  Allergen Reactions   Other Anaphylaxis, Hives and Other (See Comments)    Lobster cause large blister on tongue   Sulfa Antibiotics Other (See Comments) and Hives    Dizziness   Latex Itching and Swelling    Eye and facial swelling    Current Meds  Medication Sig   albuterol (VENTOLIN HFA) 108 (90 Base) MCG/ACT inhaler Inhale 2 puffs into the lungs every 6 (six) hours as needed for wheezing or shortness of breath.   ALPRAZolam (XANAX) 0.25 MG tablet Take 1 tablet (0.25 mg total) by mouth daily as needed for anxiety.   dicyclomine (BENTYL) 10 MG capsule Take 1 capsule (10 mg total) by mouth 3 (three) times daily before meals. (Patient taking differently: Take 10 mg by mouth as needed.)   estradiol (ESTRACE) 0.1 MG/GM vaginal cream Place 0.5 g vaginally as needed.   fluticasone (FLONASE) 50 MCG/ACT nasal spray Place 1 spray into both nostrils daily.   linaclotide (LINZESS) 72 MCG capsule Take 1 capsule (72 mcg total) by mouth daily before breakfast.   metoprolol succinate (TOPROL-XL) 50 MG 24 hr tablet TAKE ONE TABLET BY MOUTH EVERY DAY IMMEDIATELY FOLLOWING A MEAL   MIEBO 1.338 GM/ML SOLN Place 1 drop into both eyes 4 (four) times daily.   omeprazole (PRILOSEC) 20 MG capsule Take 1 capsule (20 mg total) by mouth daily. (Patient taking differently: Take 20 mg by mouth as needed.)   rosuvastatin (CRESTOR) 10 MG tablet Take 1 tablet (10 mg total) by mouth every other day.   sucralfate (CARAFATE) 1 g tablet Take 1 tablet (1 g total) by mouth 4 (four) times daily -  with meals and at bedtime.     Family History   Family History  Problem Relation Age of Onset   Colon polyps Mother    Heart disease Mother        PVC @age  28   Atrial fibrillation Mother 5   Pancreatitis Mother    Colon polyps Father    Heart disease Father        MI @ags  31 , CABG @55    Hypertension Father    Hyperlipidemia Father    Hearing loss Father    Heart  attack Father 62   Asthma Father    Kidney cancer Father    Dementia Maternal Grandmother    Heart failure Maternal Grandfather    Heart attack Maternal Grandfather    Stroke Paternal Grandmother 77   Heart attack Paternal Grandfather        young   Depression Son    Colon cancer Neg Hx    Esophageal cancer Neg Hx    Stomach cancer Neg Hx    Pancreatic cancer Neg Hx    Rectal cancer Neg Hx    Social History   Social History   Tobacco Use   Smoking status: Former    Current packs/day: 0.00    Average packs/day: 0.3 packs/day for 16.0 years (4.0 ttl pk-yrs)    Types: Cigarettes  Start date: 06/18/1988    Quit date: 06/18/2004    Years since quitting: 19.0   Smokeless tobacco: Never  Vaping Use   Vaping status: Never Used  Substance Use Topics   Alcohol use: Yes    Comment: occasionally-no specific pattern, rare-wine   Drug use: No   Kassidy reports that she quit smoking about 19 years ago. Her smoking use included cigarettes. She started smoking about 35 years ago. She has a 4 pack-year smoking history. She has never used smokeless tobacco. She reports current alcohol use. She reports that she does not use drugs.  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[employer], 1 son and 1 daughter  Vital Signs and Physical Examination   Vitals:   07/09/23 1006 07/09/23 1051  BP: (!) 146/84 132/84  Pulse: 72   Height: 5\' 2"  (1.575 m)   Weight: 151 lb 6 oz (68.7 kg)   BMI (Calculated): 27.68      General: Well developed, well nourished, no acute distress Head: Normocephalic and atraumatic Eyes: Sclerae anicteric, EOMI Ears: Normal auditory acuity Mouth: No deformities or lesions noted Lungs: Clear throughout to auscultation Heart: Regular rate and rhythm; No murmurs, rubs or bruits Abdomen: Soft, tender to palpation over left abdomen >> right, and non distended. No masses, hepatosplenomegaly or hernias noted. Normal Bowel sounds Rectal:  Deferred Musculoskeletal: Symmetrical with no gross deformities  Pulses:  Normal pulses noted Extremities: No edema or deformities noted Neurological: Alert oriented x 4, grossly nonfocal Psychological:  Alert and cooperative. Normal mood and affect   Review of Data  The following data was reviewed at the time of this encounter:  Laboratory Studies      Latest Ref Rng & Units 04/02/2023    1:25 PM 09/12/2022   10:47 AM 08/14/2021    2:54 PM  CBC  WBC 3.4 - 10.8 x10E3/uL 7.8  5.4  6.4   Hemoglobin 11.1 - 15.9 g/dL 86.5  78.4  69.6   Hematocrit 34.0 - 46.6 % 41.9  41.1  41.0   Platelets 150 - 450 x10E3/uL 250  214  220     Lab Results  Component Value Date   LIPASE 43 04/02/2023      Latest Ref Rng & Units 04/02/2023    1:25 PM 09/12/2022   10:47 AM 06/14/2021    9:41 AM  CMP  Glucose 70 - 99 mg/dL 89  75  89   BUN 6 - 24 mg/dL 19  18  14    Creatinine 0.57 - 1.00 mg/dL 2.95  2.84  1.32   Sodium 134 - 144 mmol/L 138  140  141   Potassium 3.5 - 5.2 mmol/L 4.4  4.7  4.6   Chloride 96 - 106 mmol/L 100  101  103   CO2 20 - 29 mmol/L 21  29  25    Calcium 8.7 - 10.2 mg/dL 9.4  9.7  9.3   Total Protein 6.0 - 8.5 g/dL 7.0  6.5  6.5   Total Bilirubin 0.0 - 1.2 mg/dL 0.4  0.4  0.3   Alkaline Phos 44 - 121 IU/L 72  70  57   AST 0 - 40 IU/L 18  15  14    ALT 0 - 32 IU/L 15  13  13     Lab Results  Component Value Date   TSH 1.430 09/12/2022   H. pylori breath test 04/02/2023-negative   Imaging Studies  CT AP 06/30/2020 1. No acute abnormality.  No evidence of bowel obstruction or acute bowel inflammation. Mild diffuse colonic diverticulosis, with no evidence of acute diverticulitis. 2. Aortic Atherosclerosis (ICD10-I70.0).  Abdominal ultrasound 05/29/2019 1. 7 mm and 2 mm gallbladder polyps. 2. Otherwise, unremarkable examination.  CT AP 01/26/2019 1. 5 by 5 by 3 mm left proximal ureteral calculus associated with borderline left hydronephrosis, but with contrast medium  extending distal to the stone on delayed images. 2. 7 by 5 by 5 mm non-dependent filling defect in the gallbladder on image 23/2. This is most likely a polyp, less likely adherent gallstone. Follow up sonography is recommended in 6 months time. This recommendation complies with the 2017 joint guidelines between the European Society of Gastrointestinal and Abdominal Radiology (ESGAR), European Association for Endoscopic Surgery and other Interventional Techniques (EAES), International Society of Digestive Surgery - European Federation (EFISDS) and European Society of Gastrointestinal Endoscopy (ESGE). 3.  Aortic Atherosclerosis (ICD10-I70.0). 4. Mild sigmoid colon diverticulosis. 5. At least partial pancreas divisum.  GI Procedures and Studies  Colonoscopy 06/28/2021 Two 7-8 mm polyps in [facility], left colon diverticulosis, IH/EH Path: TA and HP  Colonoscopy 05/15/2016 One 7 mm polyp in TC, IH/EH Path: SSA   Clinical Impression  It is my clinical impression that Ms. Sperduto is a 58 y.o. female with;  Suspected IBS-C leading to abdominal pain. Constipation, incomplete fecal evacuation Mild colon diverticulosis S/P cholecystectomy, appendectomy, hysterectomy Personal history of sessile serrated adenomatous, tubular adenomatous colon polyps Partial pancreas to be Molly Peterson CT imaging  Molly Peterson returns to the office today for follow-up of irritable bowel syndrome that appears to be constipation predominant.  She reports that she developed irregular bowel habits after cholecystectomy for gallbladder polyps in 2020.  She describes having infrequent stools but when she does defecate a sense of incomplete evacuation and thick stools.  Also endorses abdominal pain in her upper mid abdomen that can awaken her 3-4 nights of the week.  She has not found dicyclomine beneficial and feels that the dosing regimen 3 times a day is inconvenient.  At today's visit we discussed the possibility of trialing a  prescription promotility laxative to see if this better regulates her bowels.  I have suggested a trial of low-dose Linzess 72 mcg which can be further dose titrated to effect.  If this is not effective can consider switching to Motegrity or potentially Molly Peterson.  She is consuming what appears to be an adequate amount of fiber in her diet.  She has found benefit from using Slim fast shakes that contain fiber and protein and can continue to use these.  Plan  Trial of Linzess 72 mcg orally daily.  Advised her to send me a MyChart message in 2 to 3 weeks to report on her symptoms.  Medication can be titrated to effect.  Other options include Motegrity or Molly Peterson. Continue high-fiber diet and the use to Slim fast shakes. If abdominal pain remains an ongoing issue can consider trial of IBgard, peppermint derivatives or consideration of amitriptyline Surveillance colonoscopy due January 2030  Planned Follow Up 3-4 months  The patient or caregiver verbalized understanding of the material covered, with no barriers to understanding. All questions were answered. Patient or caregiver is agreeable with the plan outlined above.    It was a pleasure to see Molly Peterson.  If you have any questions or concerns regarding this evaluation, do not hesitate to contact me.  Maren Beach, MD [facility]   I spent total of 40 minutes in both face-to-face and non-face-to-face activities, excluding  procedures performed, for the visit on the date of this encounter.

## 2023-07-09 ENCOUNTER — Ambulatory Visit: Payer: BC Managed Care – PPO | Admitting: Pediatrics

## 2023-07-09 ENCOUNTER — Encounter: Payer: Self-pay | Admitting: Pediatrics

## 2023-07-09 VITALS — BP 132/84 | HR 72 | Ht 62.0 in | Wt 151.4 lb

## 2023-07-09 DIAGNOSIS — R109 Unspecified abdominal pain: Secondary | ICD-10-CM | POA: Diagnosis not present

## 2023-07-09 DIAGNOSIS — R198 Other specified symptoms and signs involving the digestive system and abdomen: Secondary | ICD-10-CM

## 2023-07-09 DIAGNOSIS — Z860100 Personal history of colon polyps, unspecified: Secondary | ICD-10-CM

## 2023-07-09 DIAGNOSIS — R1084 Generalized abdominal pain: Secondary | ICD-10-CM

## 2023-07-09 DIAGNOSIS — K5900 Constipation, unspecified: Secondary | ICD-10-CM

## 2023-07-09 DIAGNOSIS — K5730 Diverticulosis of large intestine without perforation or abscess without bleeding: Secondary | ICD-10-CM

## 2023-07-09 DIAGNOSIS — Z860101 Personal history of adenomatous and serrated colon polyps: Secondary | ICD-10-CM

## 2023-07-09 MED ORDER — LINACLOTIDE 72 MCG PO CAPS: 72.0000 ug | ORAL_CAPSULE | Freq: Every day | ORAL | 3 refills | Status: DC

## 2023-07-09 NOTE — Patient Instructions (Addendum)
_______________________________________________________  If your blood pressure at your visit was 140/90 or greater, please contact your primary care physician to follow up on this.  _______________________________________________________  If you are age 58 or older, your body mass index should be between 23-30. Your Body mass index is 27.69 kg/m. If this is out of the aforementioned range listed, please consider follow up with your Primary Care Provider.  If you are age 93 or younger, your body mass index should be between 19-25. Your Body mass index is 27.69 kg/m. If this is out of the aformentioned range listed, please consider follow up with your Primary Care Provider.   ________________________________________________________  The [facility] providers would like to encourage you to use [facility] to communicate with providers for non-urgent requests or questions.  Due to long hold times on the telephone, sending your provider a message by [facility] may be a faster and more efficient way to get a response.  Please allow 48 business hours for a response.  Please remember that this is for non-urgent requests.  _______________________________________________________  We have sent the following medications to your pharmacy for you to pick up at your convenience:  START: Linzess one capsule daily.  Linzess works best when taken once a day every day, on an empty stomach, at least 30 minutes before your first meal of the day.  When Linzess is taken daily as directed:  *Constipation relief is typically felt in about a week *IBS-C patients may begin to experience relief from belly pain and overall abdominal symptoms (pain, discomfort, and bloating) in about 1 week,   with symptoms typically improving over 12 weeks.  Diarrhea may occur in the first 2 weeks -keep taking it.  The diarrhea should go away and you should start having normal, complete, full bowel movements. It may be helpful to  start treatment when you can be near the comfort of your own bathroom, such as a weekend.   Thank you for entrusting me with your care and choosing [facility].  Dr Doy Hutching

## 2023-07-12 ENCOUNTER — Telehealth: Payer: Self-pay | Admitting: Pharmacy Technician

## 2023-07-12 NOTE — Telephone Encounter (Signed)
Pharmacy Patient Advocate Encounter   Received notification from CoverMyMeds that prior authorization for LINZESS is required/requested.   Insurance verification completed.   The patient is insured through [facility] .   Per test claim: PA required; PA submitted to above mentioned insurance via CoverMyMeds Key/confirmation #/EOC [id number] Status is pending

## 2023-07-15 DIAGNOSIS — F4321 Adjustment disorder with depressed mood: Secondary | ICD-10-CM | POA: Diagnosis not present

## 2023-07-20 DIAGNOSIS — R7309 Other abnormal glucose: Secondary | ICD-10-CM | POA: Diagnosis not present

## 2023-07-29 ENCOUNTER — Other Ambulatory Visit: Payer: Self-pay | Admitting: Nurse Practitioner

## 2023-07-29 DIAGNOSIS — F419 Anxiety disorder, unspecified: Secondary | ICD-10-CM

## 2023-07-29 NOTE — Telephone Encounter (Signed)
 Copied from CRM [id number]. Topic: Clinical - Medication Refill >> Jul 29, 2023  1:33 PM Margarette Shawl wrote: Most Recent Primary Care Visit:  Provider: Dorothe Gaster  Department: [facility]  Visit Type: OFFICE VISIT  Date: 04/02/2023  Medication: ALPRAZolam  (XANAX ) 0.25 MG tablet   Has the patient contacted their pharmacy? Yes (Agent: If no, request that the patient contact the pharmacy for the refill. If patient does not wish to contact the pharmacy document the reason why and proceed with request.) (Agent: If yes, when and what did the pharmacy advise?)  Is this the correct pharmacy for this prescription? Yes If no, delete pharmacy and type the correct one.  This is the patient's preferred pharmacy:  [facility] [address] - [place], [place] - [address] [facility] [address] [facility] [address] [place] [address] Phone: [phone] Fax: [phone]   Has the prescription been filled recently? No  Is the patient out of the medication? Yes  Has the patient been seen for an appointment in the last year OR does the patient have an upcoming appointment? Yes  Can we respond through MyChart? Yes  Agent: Please be advised that Rx refills may take up to 3 business days. We ask that you follow-up with your pharmacy.

## 2023-07-31 ENCOUNTER — Telehealth: Payer: Self-pay

## 2023-07-31 DIAGNOSIS — F419 Anxiety disorder, unspecified: Secondary | ICD-10-CM

## 2023-07-31 NOTE — Telephone Encounter (Signed)
Copied from CRM [id number]. Topic: Clinical - Medication Question >> Jul 31, 2023 12:06 PM Fredrich Romans wrote: Reason for CRM: patient called in to check on the status of xanax being sent into pharmacy for her. She called the request in on 07/29/2023

## 2023-07-31 NOTE — Telephone Encounter (Signed)
The last we talked she had tried buspar but did not tolerate it well. Is see that Dr. Rana Snare was the last provider to write int in 2023. Can we get more information or a virtual visit

## 2023-08-02 NOTE — Telephone Encounter (Signed)
Called patient states she had last script from Dr. Selena Batten not Dr. Rana Snare. She only takes as needed and has had the same bottle from 2023. She has been given for a different reason other than the buspar. She didn't like the way it made her feel like she was hungry all the time.  Do you want her to make virtual with her. She has been out for over a week.

## 2023-08-02 NOTE — Telephone Encounter (Signed)
Please schedule a virtual visit

## 2023-08-07 DIAGNOSIS — D2272 Melanocytic nevi of left lower limb, including hip: Secondary | ICD-10-CM | POA: Diagnosis not present

## 2023-08-07 DIAGNOSIS — D2261 Melanocytic nevi of right upper limb, including shoulder: Secondary | ICD-10-CM | POA: Diagnosis not present

## 2023-08-07 DIAGNOSIS — D2262 Melanocytic nevi of left upper limb, including shoulder: Secondary | ICD-10-CM | POA: Diagnosis not present

## 2023-08-07 DIAGNOSIS — D225 Melanocytic nevi of trunk: Secondary | ICD-10-CM | POA: Diagnosis not present

## 2023-08-07 NOTE — Telephone Encounter (Signed)
Spoke to pt regarding r/s her virtual appt on tomorrow, 2/20 with [facility]. Pt states she is confused on why the rx cannot be sent into her pharmacy? Pt states she spoke to someone last week & was told the rx was already sent to pharmacy, pt's pharmacy stated they received no rx for her. Pt stated she was told Toney Reil would contact her to discuss the rx, but never received any calls. Pt states the past two weeks, she's been told different things & is now confused. Pt requested a call back from [place]? Pt did not want to r/s appt. Please advise. Call back # [phone].

## 2023-08-08 ENCOUNTER — Ambulatory Visit: Payer: BC Managed Care – PPO | Admitting: Nurse Practitioner

## 2023-08-08 ENCOUNTER — Encounter: Payer: Self-pay | Admitting: Nurse Practitioner

## 2023-08-08 MED ORDER — ALPRAZOLAM 0.25 MG PO TABS: 0.2500 mg | ORAL_TABLET | Freq: Every day | ORAL | 0 refills | Status: DC | PRN

## 2023-08-08 NOTE — Addendum Note (Signed)
Addended by: Eden Emms on: 08/08/2023 01:06 PM   Modules accepted: Orders

## 2023-08-17 DIAGNOSIS — R7309 Other abnormal glucose: Secondary | ICD-10-CM | POA: Diagnosis not present

## 2023-09-02 ENCOUNTER — Telehealth: Payer: Self-pay | Admitting: Pediatrics

## 2023-09-02 NOTE — Telephone Encounter (Signed)
 Inbound call from patient, states she is still experiencing abdominal pains. She states it is constantly burning from under her chest to under her belly button. She states she has been watching her diet and has limited foods to help. She states she lost over 4 days of work due to the pain and feels as if the medication that was sent by Dr. Doy Hutching has not stopped her symptoms. Patient is anxious and nervous about the pain and would like to discuss with a nurse.

## 2023-09-02 NOTE — Telephone Encounter (Signed)
 Returned call to patient. Pt states that she has had continued abdominal pain despite Linzess. Pt states that she does not have constipation, she just does evacuate completely. Patient states that the "burning" in her stomach started after she had [place] food and a margarita last week. Pt typically does not drink alcohol so she wonders if the [facility] caused her discomfort. Pt states that she does not have a hx of reflux, but PCP previously prescribed Omeprazole and Sucralfate. Patient mentioned that she is not taking Omeprazole or Sucralfate as listed. Pt states that she does not remember taking any of these medications, and she probably stopped it if it didn't work. Patient is concerned about the burning she is feeling from her belly button up to her chest. Pt states that the Linzess seemed to work okay, her last BM was today. Pt describes her stools as "soft, formed, and moist". Please advise, thanks.

## 2023-09-02 NOTE — Telephone Encounter (Signed)
 Called and spoke with patient regarding recommendations. I reviewed the information with her, patient interrupts me mid conversation to let me know that fiber supplements do not work for her. She has "tried all fiber supplements and it just makes her have looser stools". Patient reports that bowel habits have not been the same since her cholecystectomy. Patient denies any NSAID use or alcohol, except this last week. Patient does not take any new medications that could cause her symptoms. Patient has been advised that she should already have a prescription for Linzess at her pharmacy. Patient will try Pantoprazole 20 mg every morning before breakfast and Pepcid at bedtime. Patient mentioned several times that she has tried multiple medications and nothing seems to help, when asked about specific medications patient can't recall exactly what she tried. Pt only took Pepcid when she had an allergic reaction. I explained to patient that she will not have instant relief with these medications but she should see some improvement within a couple of weeks. Patient is very concerned that she may have an ulcer or something abnormal. Pt's husband thinks that she may just have a "stomach bug". PCP ordered H. Pylori breath test in 03/2023 and it was negative, PCP prescribed Omeprazole and Sucralfate at that time as well. Pt did not want to proceed with any endoscopic procedure at this time. Patient will try recommendations for a couple of weeks then will call if no improvement prior to her follow up appt with Dr. Doy Hutching on 10/08/23. I advised that the Pantoprazole and Pepcid should help with burning that she described, not constipation. Pt verbalized understanding.

## 2023-09-04 MED ORDER — PANTOPRAZOLE SODIUM 20 MG PO TBEC: 20.0000 mg | DELAYED_RELEASE_TABLET | Freq: Every day | ORAL | 2 refills | Status: DC

## 2023-09-04 NOTE — Telephone Encounter (Signed)
 Patient called and stated that she called on Monday and was suppose to get medication sent over to her pharmacy. Patient is not sure exactly what medication she was getting at the pharmacy. Patient stated that she had to miss work again to due to her being in a lot of pain. Patient is requesting a call back to get this taking care of. Please advise.

## 2023-09-04 NOTE — Telephone Encounter (Signed)
 MyChart message sent to patient.

## 2023-09-04 NOTE — Addendum Note (Signed)
 Addended by: Marisa Sprinkles on: 09/04/2023 03:23 PM   Modules accepted: Orders

## 2023-09-17 DIAGNOSIS — R7309 Other abnormal glucose: Secondary | ICD-10-CM | POA: Diagnosis not present

## 2023-10-04 ENCOUNTER — Telehealth: Payer: Self-pay | Admitting: Internal Medicine

## 2023-10-04 NOTE — Telephone Encounter (Signed)
 Patient reports over the last 2 weeks she has been experiencing progressive SOB with activity. She states she is currently experiencing allergy symptoms, but this started prior to her allergy symptoms.  She also reports she has been feeling "extremely fatigued and waking up tired." She reports good hydration.  She also reports feeling dizzy/lightheaded with quick position changes.  Instructed her on maintaining adequate hydration and making position changes slowly to prevent dizziness/lightheadedness.  Scheduled next available appt with DOD Dr. Alroy Aspen on 10/15/23 at 4:00PM. Informed patient this will be at our new location at [address] [facility].  Advised on ED precautions if symptoms worsen before appt. Patient verbalized understanding.

## 2023-10-04 NOTE — Telephone Encounter (Signed)
   Pt c/o Shortness Of Breath: STAT if SOB developed within the last 24 hours or pt is noticeably SOB on the phone  1. Are you currently SOB (can you hear that pt is SOB on the phone)? No   2. How long have you been experiencing SOB? Last 2 weeks   3. Are you SOB when sitting or when up moving around? Moving around   4. Are you currently experiencing any other symptoms? Patient said her shortness of breath has been progressing over the last two weeks and is becoming more noticeable after walking or with slight exertion. She also reports feeling dizzy when she stands up or turns too quickly."

## 2023-10-08 ENCOUNTER — Ambulatory Visit: Payer: BC Managed Care – PPO | Admitting: Pediatrics

## 2023-10-15 ENCOUNTER — Ambulatory Visit: Attending: Cardiovascular Disease | Admitting: Cardiovascular Disease

## 2023-10-15 ENCOUNTER — Encounter: Payer: Self-pay | Admitting: Cardiovascular Disease

## 2023-10-15 ENCOUNTER — Telehealth: Payer: Self-pay | Admitting: Radiology

## 2023-10-15 VITALS — BP 134/86 | HR 76 | Ht 62.0 in | Wt 148.8 lb

## 2023-10-15 DIAGNOSIS — R400 Somnolence: Secondary | ICD-10-CM | POA: Diagnosis not present

## 2023-10-15 DIAGNOSIS — I10 Essential (primary) hypertension: Secondary | ICD-10-CM

## 2023-10-15 DIAGNOSIS — E785 Hyperlipidemia, unspecified: Secondary | ICD-10-CM

## 2023-10-15 MED ORDER — ALBUTEROL SULFATE HFA 108 (90 BASE) MCG/ACT IN AERS: 2.0000 | INHALATION_SPRAY | Freq: Four times a day (QID) | RESPIRATORY_TRACT | 0 refills | Status: AC | PRN

## 2023-10-15 NOTE — Patient Instructions (Signed)
 Medication Instructions:  REFILLED Albuterol  *If you need a refill on your cardiac medications before your next appointment, please call your pharmacy*  Testing/Procedures: [facility] Sleep Study Your physician has recommended that you have a sleep study. This test records several body functions during sleep, including: brain activity, eye movement, oxygen and carbon dioxide blood levels, heart rate and rhythm, breathing rate and rhythm, the flow of air through your mouth and nose, snoring, body muscle movements, and chest and belly movement.  Follow-Up: At [facility], you and your health needs are our priority.  As part of our continuing mission to provide you with exceptional heart care, our providers are all part of one team.  This team includes your primary Cardiologist (physician) and Advanced Practice Providers or APPs (Physician Assistants and Nurse Practitioners) who all work together to provide you with the care you need, when you need it.  Your next appointment:   3 month(s)  Provider:   Ola Berger, MD

## 2023-10-15 NOTE — Progress Notes (Signed)
  Cardiology Office Note:  .   Date:  10/15/2023  ID:  Molly Peterson, DOB 18-Jan-1966, MRN [id number] PCP: Dorothe Gaster, NP  [facility] Providers Cardiologist:  Ola Berger, MD    History of Present Illness: Molly Peterson is a 58 y.o. female patient of Dr. Avanell Bob.  She has a history of hypertension, hyperlipidemia, anxiety, aortic calcification, depression, palpitations and a family history of coronary artery disease.  She called into the office last week complaining of being extremely fatigued and waking up fatigued.  She has had progressive shortness of breath.  She has been having lots of issues with allergies.  Has had some DOE while climbing stairs  Has an inhaler ( is expired )   Has lots of stress  Her 27 yo son lives with her ( bipolar, narcolepsy) Is always late for work   Engineer, agricultural a very poor diet Lots of sweets ( circus peanuts, chololate )  Knows that she does not eat correctly    Is the Arboriculturist for [employer]    Does not get regular exercise  Used to exercise quite a bit but no cardio recently   ROS:   Studies Reviewed: .         Risk Assessment/Calculations:             Physical Exam:   VS:  BP 134/86   Pulse 76   Ht 5\' 2"  (1.575 m)   Wt 148 lb 12.8 oz (67.5 kg)   SpO2 95%   BMI 27.22 kg/m    Wt Readings from Last 3 Encounters:  10/15/23 148 lb 12.8 oz (67.5 kg)  07/09/23 151 lb 6 oz (68.7 kg)  04/22/23 151 lb (68.5 kg)    GEN: Well nourished, well developed in no acute distress NECK: No JVD; No carotid bruits CARDIAC: RRR, no murmurs, rubs, gallops RESPIRATORY:  Clear to auscultation without rales, wheezing or rhonchi  ABDOMEN: Soft, non-tender, non-distended EXTREMITIES:  No edema; No deformity   ASSESSMENT AND PLAN: .   Shortness of breath with exertion: Came to the presents with for further evaluation of shortness of breath with exertion.  Her echocardiogram last year looked normal.  She is  not volume overloaded.  She is not having any chest tightness that would be suggestive of ischemia.  She eats an extremely poor diet including lots of candy all day long.  She frequently skips real meals but instead will eat chips or cookies .  I suspect a lot of her problem is due to not feeling her body correctly.  She is also under lots of stress.  Her 79 year old son with bipolar disease and narcolepsy now lives with them and is causing lots of stress in the house.   She had an inhaler that has now expired.  Will refill her albuterol  inhaler.  Will screen her for home sleep study. She will work on improving her diet and exercise regimen.  Will have her see Dr. Avanell Bob in 3 to 4 months.        Dispo: 3 months with Dr. Avanell Bob   Signed, Ahmad Alert, MD

## 2023-10-15 NOTE — Telephone Encounter (Signed)
 Patient agreement reviewed and signed on 10/15/2023.  WatchPAT issued to patient on 10/15/2023 by Nathalie Baize. Patient aware to not open the WatchPAT box until contacted with the activation PIN. Patient profile initialized in CloudPAT on 10/15/2023 by Jenise Mixer. Device serial number: [id number]  Please list Reason for Call as Advice Only and type "WatchPAT issued to patient" in the comment box.

## 2023-10-17 DIAGNOSIS — R7309 Other abnormal glucose: Secondary | ICD-10-CM | POA: Diagnosis not present

## 2023-11-01 ENCOUNTER — Encounter: Payer: Self-pay | Admitting: Cardiovascular Disease

## 2023-11-05 ENCOUNTER — Telehealth: Payer: Self-pay | Admitting: Cardiovascular Disease

## 2023-11-05 NOTE — Telephone Encounter (Signed)
 Ordering provider: DR Alroy Aspen Associated diagnoses: R40.0 WatchPAT PA obtained on 11/05/2023 by Gaylene Kays, CMA. Authorization: No; tracking ID NO PA REQ Patient notified of PIN (1234) on 11/05/2023 via Notification Method: phone.

## 2023-11-05 NOTE — Telephone Encounter (Signed)
 New Messaged;     Patient called. She says she has the watch for her Sleep Study. She says she have had it for 2 weeks. She says she needs the password or whatever it is to open it up.

## 2023-11-12 NOTE — Telephone Encounter (Signed)
 Ordering provider: PHILLIP NASHER Associated diagnoses: R40.0-I10 WatchPAT PA obtained on 11/12/2023 by Gaylene Kays, CMA. Authorization: No; tracking ID NO PA REQ Patient notified of PIN (1234) on 11/12/2023 via Notification Method: phone. Left message

## 2023-11-13 ENCOUNTER — Encounter (INDEPENDENT_AMBULATORY_CARE_PROVIDER_SITE_OTHER): Admitting: Cardiology

## 2023-11-13 DIAGNOSIS — G4733 Obstructive sleep apnea (adult) (pediatric): Secondary | ICD-10-CM | POA: Diagnosis not present

## 2023-11-17 DIAGNOSIS — R7309 Other abnormal glucose: Secondary | ICD-10-CM | POA: Diagnosis not present

## 2023-11-18 ENCOUNTER — Ambulatory Visit: Attending: Cardiovascular Disease

## 2023-11-18 DIAGNOSIS — I10 Essential (primary) hypertension: Secondary | ICD-10-CM

## 2023-11-18 DIAGNOSIS — R400 Somnolence: Secondary | ICD-10-CM

## 2023-11-18 NOTE — Procedures (Signed)
   SLEEP STUDY REPORT Patient Information Study Date: 11/13/2023 Patient Name: Molly Peterson Patient ID: [id number] Birth Date: 1965-08-18 Age: 58 Gender: Female BMI: 27.2 (W=148 lb, H=5' 2'') Stopbang: 6 Referring Physician: Ahmad Alert, MD  TEST DESCRIPTION: Home sleep apnea testing was completed using the WatchPat, a Type 1 device, utilizing peripheral arterial tonometry (PAT), chest movement, actigraphy, pulse oximetry, pulse rate, body position and snore. AHI was calculated with apnea and hypopnea using valid sleep time as the denominator. RDI includes apneas, hypopneas, and RERAs. The data acquired and the scoring of sleep and all associated events were performed in accordance with the recommended standards and specifications as outlined in the AASM Manual for the Scoring of Sleep and Associated Events 2.2.0 (2015).  FINDINGS:  1. Mild Obstructive Sleep Apnea with AHI 8.3/hr. Most events occurred in the supine and prone positions.  2. No Central Sleep Apnea with pAHIc 1.9/hr.  3. Oxygen desaturations as low as 90%.  4. Mild to moderate snoring was present. O2 sats were < 88% for 0 min.  5. Total sleep time was 5 hrs and 17 min.  6. 12.1% of total sleep time was spent in REM sleep.  7. Normal sleep onset latency at 20 min.  8. Normal REM sleep onset latency at 98 min.  9. Total awakenings were 6. 10. Arrhythmia detection: None  DIAGNOSIS: Mild Obstructive Sleep Apnea (G47.33)  RECOMMENDATIONS: 1. Clinical correlation of these findings is necessary. The decision to treat obstructive sleep apnea (OSA) is usually based on the presence of apnea symptoms or the presence of associated medical conditions such as Hypertension, Congestive Heart Failure, Atrial Fibrillation or Obesity. The most common symptoms of OSA are snoring, gasping for breath while sleeping, daytime sleepiness and fatigue. 2. Initiating apnea therapy is recommended given the presence of symptoms and/or  associated conditions. Recommend proceeding with one of the following:  a. Auto-CPAP therapy with a pressure range of 5-20cm H2O.  b. An oral appliance (OA) that can be obtained from certain dentists with expertise in sleep medicine. These are primarily of use in non-obese patients with mild and moderate disease.  c. An ENT consultation which may be useful to look for specific causes of obstruction and possible treatment options.  d. If patient is intolerant to PAP therapy, consider referral to ENT for evaluation for hypoglossal nerve stimulator. 3. Close follow-up is necessary to ensure success with CPAP or oral appliance therapy for maximum benefit . 4. A follow-up oximetry study on CPAP is recommended to assess the adequacy of therapy and determine the need for supplemental oxygen or the potential need for Bi-level therapy. An arterial blood gas to determine the adequacy of baseline ventilation and oxygenation should also be considered. 5. Healthy sleep recommendations include: adequate nightly sleep (normal 7-9 hrs/night), avoidance of caffeine after noon and alcohol near bedtime, and maintaining a sleep environment that is cool, dark and quiet. 6. Weight loss for overweight patients is recommended. Even modest amounts of weight loss can significantly improve the severity of sleep apnea. 7. Snoring recommendations include: weight loss where appropriate, side sleeping, and avoidance of alcohol before bed. 8. Operation of motor vehicle should be avoided when sleepy.  Signature: Gaylyn Keas, MD; [facility]; Diplomat, American Board of Sleep Medicine Electronically Signed: 11/18/2023 11:19:56 AM 11/13/2023,[id number],1965/07/23,Female Page 2 of 5 Rev. 351 P

## 2023-11-21 ENCOUNTER — Telehealth: Payer: Self-pay

## 2023-11-21 NOTE — Telephone Encounter (Signed)
-----   Message from Gaylyn Keas sent at 11/18/2023 11:22 AM EDT ----- Mild OSA - mainly occurs in the supine and prone positions.  Patient has very mild OSA - set up OV to discuss treatment options.

## 2023-11-21 NOTE — Telephone Encounter (Signed)
 Left VM with callback number for patient to receive sleep study results and recommendations.

## 2023-11-26 ENCOUNTER — Telehealth: Payer: Self-pay

## 2023-11-26 DIAGNOSIS — I700 Atherosclerosis of aorta: Secondary | ICD-10-CM

## 2023-11-26 DIAGNOSIS — I10 Essential (primary) hypertension: Secondary | ICD-10-CM

## 2023-11-26 DIAGNOSIS — E785 Hyperlipidemia, unspecified: Secondary | ICD-10-CM

## 2023-11-26 DIAGNOSIS — G4733 Obstructive sleep apnea (adult) (pediatric): Secondary | ICD-10-CM

## 2023-11-26 DIAGNOSIS — R0602 Shortness of breath: Secondary | ICD-10-CM

## 2023-11-26 DIAGNOSIS — R400 Somnolence: Secondary | ICD-10-CM

## 2023-11-26 NOTE — Telephone Encounter (Signed)
 Notified patient of sleep study results and recommendations. All questions were answered and patient verbalized understanding. Message sent to LN to get patient scheduled for OV with Dr. Micael Adas to discuss treatment options.

## 2023-11-26 NOTE — Telephone Encounter (Signed)
-----   Message from Gaylyn Keas sent at 11/18/2023 11:22 AM EDT ----- Mild OSA - mainly occurs in the supine and prone positions.  Patient has very mild OSA - set up OV to discuss treatment options.

## 2023-11-27 NOTE — Telephone Encounter (Signed)
 I would recomm:   CBC, CMET, TSH, Vit D, lipomed panel and Lpa

## 2023-12-03 NOTE — Telephone Encounter (Signed)
 Left a message for the pt to call back... lab orders have been placed.

## 2023-12-09 ENCOUNTER — Encounter: Payer: Self-pay | Admitting: Cardiovascular Disease

## 2023-12-11 ENCOUNTER — Ambulatory Visit: Attending: Cardiology | Admitting: Cardiology

## 2023-12-11 VITALS — BP 124/84 | HR 86 | Ht 63.0 in | Wt 142.0 lb

## 2023-12-11 DIAGNOSIS — G4733 Obstructive sleep apnea (adult) (pediatric): Secondary | ICD-10-CM | POA: Diagnosis not present

## 2023-12-11 DIAGNOSIS — E785 Hyperlipidemia, unspecified: Secondary | ICD-10-CM

## 2023-12-11 DIAGNOSIS — I700 Atherosclerosis of aorta: Secondary | ICD-10-CM

## 2023-12-11 DIAGNOSIS — I10 Essential (primary) hypertension: Secondary | ICD-10-CM

## 2023-12-11 NOTE — Progress Notes (Signed)
 Order for auto CPAP from 4 to 15 cm H2O with heated humidity using a nasal pillow mask and supplies sent to [facility] today.

## 2023-12-11 NOTE — Progress Notes (Signed)
 [facility] Return Visit   Referring Provider Wendee Lynwood HERO, NP [address] [place],  [place] [address]  Primary Care Provider Wendee, Lynwood HERO, NP  Patient Profile: Molly Peterson is a 58 y.o. female who returns to the [facility] for follow-up of the problem(s) noted below.  Problem List: Suspected IBS-C leading to abdominal pain. Constipation, incomplete fecal evacuation Mild colon diverticulosis S/P cholecystectomy, appendectomy, hysterectomy Personal history of sessile serrated adenomatous, tubular adenomatous colon polyps Partial pancreas divisum on CT imaging   History of Present Illness   Molly Peterson was last seen in the GI office 07/09/2023  Today she participates in a video visit  Current GI Meds  Dicyclomine  10-20 mg p.o. 3 times daily as needed abdominal pain -not taking as she states it does not help Omeprazole  20 mg orally daily Sucralfate  1 g p.o. 4 times daily  Interval History   History obtained at the time of Molly Peterson's last visit included the following:  Molly Peterson returns to the office today for follow-up of irritable bowel syndrome  She informs me that her gastrointestinal problems began in 2020 when she was experiencing significant abdominal pain During that evaluation she was found to have gallbladder polyps prompting cholecystectomy Since that time she has experienced irregular bowel habits with infrequent stools that are typically hard soft/thick in nature but not diarrhea  Currently reports skipping several days in a row going to the bathroom When she is constipated she will take over-the-counter stool softeners as well as a Slim fast shake States that Slim fast shake contains a high amount of fiber and seems to prompt bowel movements Stools are described as being thick in consistency and can then occur several times a day; stools are never hard No blood or mucus in her stool Endorses some straining with  defecation  Reports consuming a high amount of fiber in her diet Eats oatmeal, yogurt, berries, bananas, oranges, whole-wheat bread, turnip greens and beans Stays well-hydrated with water Has also tried fiber supplements that have not been beneficial  At the time of her last appointment was advised to increase dicyclomine  from 10 mg p.o. 3 times daily to 20 mg p.o. 3 times daily She does not feel that this has been beneficial  Describes upper abdominal pain that can awaken her from sleep 3-4 nights a week Reviewed that this could represent constipation  Reports various sources of stress related to her busy job at [employer] and caring for her adult son with narcolepsy  Interval history: At the conclusion of Molly Peterson's last visit she was prescribed Linzess  72 mcg orally daily, advised to continue high-fiber diet and Slim fast shake Reports she did not note much difference with Linzess  72 mcg dosing -when increased to 145 mcg that resulted in diarrhea and she therefore stopped it Continues to endorse a sense of not emptying her bowels completely Describes her stools as being soft and sometimes loose in consistency with an oily layer -not necessarily hard -she worries about long-term impact of this Denies straining to defecate or other symptoms of pelvic floor dysfunction Has symptoms of fecal urgency that can be problematic when she is attempting to eat out -seems worse when dining at restaurants As above, the use of antispasmodics has not mitigated this Symptoms appear to be impacting quality of life  States that her mother has had significant GI issues and worries about a similar pattern for herself Mother had what may have also been pancreatic disease him leading  to pancreatitis Molly Peterson has not had a documented history of pancreatitis Most recent cross-sectional imaging performed in 2022 did not show any pancreatic abnormalities   Reports that her abdominal pain seems to be better with the  use of Gaviscon She is taking Gaviscon as needed 2-3 times per month Not endorsing nausea or vomiting  At the time of today's visit, Molly Peterson reports having a recent allergic reaction developing hives around her neck that have spread to the rest of her body She initially thought it was related to wearing an elastic necklace but now queries if there may be another propagating factor Has taken Benadryl but still having significant itching No respiratory issues  Last colonoscopy: 06/2021 - Two 7-8 mm polyps in [facility], left colon diverticulosis, IH/EH Last endoscopy: None  Last Abd CT/CTE/MRE: CTAP 06/2020 - normal  GI Review of Symptoms Significant for abdominal pain, constipation. Otherwise negative.  General Review of Systems  Review of systems is significant for the pertinent positives and negatives as listed per the HPI.  Full ROS is otherwise negative.  Past Medical History   Past Medical History:  Diagnosis Date   Acute pharyngitis    ANXIETY 09/18/2006   on PRN meds for situational anxiety   Chronic kidney disease    hx kidney stones   DEPRESSION 09/18/2006   DERMATITIS, SEBORRHEIC NEC 01/06/2007   Edema    symptom   Flatulence, eructation, and gas pain    Hyperlipidemia    on meds   HYPERTENSION 09/18/2006   on meds   Osteopenia    Palpitations 09/18/2006   REACTION, ACUTE STRESS W/EMOTIONAL DSTURB 12/23/2006   Seasonal allergies    Unspecified constipation    Urinary tract infection, site not specified      Past Surgical History   Past Surgical History:  Procedure Laterality Date   ABDOMINAL HYSTERECTOMY     partial-still has ovaries   APPENDECTOMY  unknown   CESAREAN SECTION     x 2   CHOLECYSTECTOMY     COLONOSCOPY  2017   MS-MAC-goor prep-int/ext hem/SSP/adenoma-5 yr recall   CYSTOSCOPY  2020   POLYPECTOMY  2017   SSP/adenoma   TONSILLECTOMY  unknown     Allergies and Medications   Allergies  Allergen Reactions   Other Anaphylaxis, Hives and  Other (See Comments)    Lobster cause large blister on tongue   Sulfa  Antibiotics Other (See Comments) and Hives    Dizziness   Latex Itching and Swelling    Eye and facial swelling    No outpatient medications have been marked as taking for the 12/13/23 encounter (Appointment) with Suzann Inocente HERO, MD.     Family History   Family History  Problem Relation Age of Onset   Colon polyps Mother    Heart disease Mother        PVC @age  2   Atrial fibrillation Mother 41   Pancreatitis Mother    Colon polyps Father    Heart disease Father        MI @ags  56 , CABG @55    Hypertension Father    Hyperlipidemia Father    Hearing loss Father    Heart attack Father 26   Asthma Father    Kidney cancer Father    Dementia Maternal Grandmother    Heart failure Maternal Grandfather    Heart attack Maternal Grandfather    Stroke Paternal Grandmother 61   Heart attack Paternal Grandfather        young  Depression Son    Colon cancer Neg Hx    Esophageal cancer Neg Hx    Stomach cancer Neg Hx    Pancreatic cancer Neg Hx    Rectal cancer Neg Hx    Social History   Social History   Tobacco Use   Smoking status: Former    Current packs/day: 0.00    Average packs/day: 0.3 packs/day for 16.0 years (4.0 ttl pk-yrs)    Types: Cigarettes    Start date: 06/18/1988    Quit date: 06/18/2004    Years since quitting: 19.4   Smokeless tobacco: Never  Vaping Use   Vaping status: Never Used  Substance Use Topics   Alcohol use: Yes    Comment: occasionally-no specific pattern, rare-wine   Drug use: No   Molly Peterson reports that she quit smoking about 19 years ago. Her smoking use included cigarettes. She started smoking about 35 years ago. She has a 4 pack-year smoking history. She has never used smokeless tobacco. She reports current alcohol use. She reports that she does not use drugs.  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[employer], 1 son and 1 daughter  Vital Signs and  Physical Examination   No PE - video visit  Review of Data  The following data was reviewed at the time of this encounter:  Laboratory Studies      Latest Ref Rng & Units 04/02/2023    1:25 PM 09/12/2022   10:47 AM 08/14/2021    2:54 PM  CBC  WBC 3.4 - 10.8 x10E3/uL 7.8  5.4  6.4   Hemoglobin 11.1 - 15.9 g/dL 86.3  86.1  86.4   Hematocrit 34.0 - 46.6 % 41.9  41.1  41.0   Platelets 150 - 450 x10E3/uL 250  214  220     Lab Results  Component Value Date   LIPASE 43 04/02/2023      Latest Ref Rng & Units 04/02/2023    1:25 PM 09/12/2022   10:47 AM 06/14/2021    9:41 AM  CMP  Glucose 70 - 99 mg/dL 89  75  89   BUN 6 - 24 mg/dL 19  18  14    Creatinine 0.57 - 1.00 mg/dL 9.20  9.21  9.18   Sodium 134 - 144 mmol/L 138  140  141   Potassium 3.5 - 5.2 mmol/L 4.4  4.7  4.6   Chloride 96 - 106 mmol/L 100  101  103   CO2 20 - 29 mmol/L 21  29  25    Calcium  8.7 - 10.2 mg/dL 9.4  9.7  9.3   Total Protein 6.0 - 8.5 g/dL 7.0  6.5  6.5   Total Bilirubin 0.0 - 1.2 mg/dL 0.4  0.4  0.3   Alkaline Phos 44 - 121 IU/L 72  70  57   AST 0 - 40 IU/L 18  15  14    ALT 0 - 32 IU/L 15  13  13     Lab Results  Component Value Date   TSH 1.430 09/12/2022   H. pylori breath test 04/02/2023-negative   Imaging Studies  CT AP 06/30/2020 1. No acute abnormality. No evidence of bowel obstruction or acute bowel inflammation. Mild diffuse colonic diverticulosis, with no evidence of acute diverticulitis. 2. Aortic Atherosclerosis (ICD10-I70.0).  Abdominal ultrasound 05/29/2019 1. 7 mm and 2 mm gallbladder polyps. 2. Otherwise, unremarkable examination.  CT AP 01/26/2019 1. 5 by 5 by 3 mm left proximal ureteral calculus  associated with borderline left hydronephrosis, but with contrast medium extending distal to the stone on delayed images. 2. 7 by 5 by 5 mm non-dependent filling defect in the gallbladder on image 23/2. This is most likely a polyp, less likely adherent gallstone. Follow up  sonography is recommended in 6 months time. This recommendation complies with the 2017 joint guidelines between the European Society of Gastrointestinal and Abdominal Radiology (ESGAR), European Association for Endoscopic Surgery and other Interventional Techniques (EAES), International Society of Digestive Surgery - European Federation (EFISDS) and European Society of Gastrointestinal Endoscopy (ESGE). 3.  Aortic Atherosclerosis (ICD10-I70.0). 4. Mild sigmoid colon diverticulosis. 5. At least partial pancreas divisum.  GI Procedures and Studies  Colonoscopy 06/28/2021 Two 7-8 mm polyps in [facility], left colon diverticulosis, IH/EH Path: TA and HP  Colonoscopy 05/15/2016 One 7 mm polyp in TC, IH/EH Path: SSA   Clinical Impression  It is my clinical impression that Ms. Molly Peterson is a 58 y.o. female with;  Suspected IBS-C leading to abdominal pain. Constipation, incomplete fecal evacuation Mild colon diverticulosis S/P cholecystectomy, appendectomy, hysterectomy Personal history of sessile serrated adenomatous, tubular adenomatous colon polyps Partial pancreatic divisum on CT imaging  Molly Peterson participated in a video visit today for follow-up of irritable bowel syndrome that appears to be constipation predominant.  She reports that she developed irregular bowel habits after cholecystectomy for gallbladder polyps in 2020.  She describes having infrequent stools but when she does defecate a sense of incomplete evacuation and thick stools/loose stools.  At the time of her last visit I suggested a trial of Linzess .  She did not find benefit from the 72 mcg low dose and subsequently developed diarrhea on a higher dose of 145 mcg.  At today's visit she describes more concern about the consistency of her stools being looser in nature and less so bowel frequency.  This could potentially be related to her previous cholecystectomy.  I recommended trial of low-dose cholestyramine 4 mg daily.  We also  discussed other prescription options for constipation which she is not inclined to pursue at this time.  Molly Peterson previously endorsed abdominal pain in her upper mid abdomen that can awaken her 3-4 nights of the week.  She did not found dicyclomine  beneficial and feels that the dosing regimen 3 times a day is inconvenient.  More recently she has utilize Gaviscon which has ameliorated her abdominal discomfort.  She is using this 2-3 times per month.  She expresses concern regarding a family history and personal history of pancreatic divisum.  Cross-sectional imaging has not shown other pancreatic abnormalities.  She does not have a history of pancreatitis.  At this juncture her symptoms are not necessarily consistent with pancreatic exocrine insufficiency.  This can be monitored conservatively over time.  Plan  Trial of cholestyramine 4 g packet p.o. once daily to try to improve the consistency of her stools which she describes as being soft and oily If bowel frequency remains an issue can explore other options of senna, smooth move tea, Trulance, Motegrity, Ibsrela Continue high-fiber diet and the use to Slim fast shakes. Continue Gaviscon as needed Surveillance colonoscopy due January 2030  Planned Follow Up 3-4 months  The patient or caregiver verbalized understanding of the material covered, with no barriers to understanding. All questions were answered. Patient or caregiver is agreeable with the plan outlined above.    It was a pleasure to see Molly Peterson.  If you have any questions or concerns regarding this evaluation, do not hesitate to contact me.  Inocente Hausen, MD  Gastroenterology   This video encounter was conducted with the patient's (or proxy's) verbal consent via secure, interactive audio and video telecommunications while I was  in the [place] gastroenterology office  The patient (or proxy) was instructed to have this encounter in a suitably private space and to only have  persons present to whom they give permission to participate.  The patient confirmed he was in her home residence at the time of this encounter.  In addition, patient identity was confirmed by use of name plus an additional identifier.  I spent total of 30 minutes in both face-to-face (20 minutes interview) and non-face-to-face (10 minutes chart review, care coordination, documentation) activities, excluding procedures performed, for the visit on the date of this encounter.

## 2023-12-11 NOTE — Addendum Note (Signed)
 Addended by: BEVELY CONNELL BROCKS on: 12/11/2023 03:49 PM   Modules accepted: Orders

## 2023-12-11 NOTE — Progress Notes (Signed)
 SLEEP MEDICINE VIRTUAL CONSULT NOTE via Video Note   Because of Molly Peterson's co-morbid illnesses, she is at least at moderate risk for complications without adequate follow up.  This format is felt to be most appropriate for this patient at this time.  All issues noted in this document were discussed and addressed.  A limited physical exam was performed with this format.  Please refer to the patient's chart for her consent to telehealth for [facility].      Date:  12/11/2023   ID:  Molly Peterson, DOB 1965-12-23, MRN [id number] The patient was identified using 2 identifiers.  Patient Location: Home Provider Location: Office/Clinic   PCP:  Wendee Lynwood HERO, NP   [facility] Providers Cardiologist:  Vina Gull, MD     Evaluation Performed:  New Patient Evaluation  Chief Complaint:  OSA  History of Present Illness:    Molly Peterson is a 58 y.o. female who is being seen today for the evaluation of OSA at the request of Vina Gull, MD.  ANIJA BRICKNER is a 58 y.o. female with a history of anxiety, depression, hyperlipidemia, hypertension who was recently seen by Dr. Alveta as a work in because she was having a lot of fatigue.  She has also a son who has bipolar and narcolepsy lives with her and causes a lot of stress.  She is a Arboriculturist for [employer].  She does not get a lot of exercise.  She had told Dr. Alveta that she was not sleeping well and would wake up a lot at night.  She also grinds her teeth a lot. She does not feel rested in the am.  She does not nap during the day. No am HAs. She has been told that she snores and she wakes herself up snoring.   It was suggested that she get a sleep study to rule out sleep apnea as a cause fatigue.  She underwent a home sleep study showing mild obstructive sleep apnea with an AHI of 8.3/h and no significant nocturnal hypoxemia.  Most of her events occurred in the supine  position with an AHI 13.6/h during supine sleep and 5.9/h during nonsupine sleep.  She has now been referred for sleep medicine consultation to discuss treatment options.   Past Medical History:  Diagnosis Date   Acute pharyngitis    ANXIETY 09/18/2006   on PRN meds for situational anxiety   Chronic kidney disease    hx kidney stones   DEPRESSION 09/18/2006   DERMATITIS, SEBORRHEIC NEC 01/06/2007   Edema    symptom   Flatulence, eructation, and gas pain    Hyperlipidemia    on meds   HYPERTENSION 09/18/2006   on meds   Osteopenia    Palpitations 09/18/2006   REACTION, ACUTE STRESS W/EMOTIONAL DSTURB 12/23/2006   Seasonal allergies    Unspecified constipation    Urinary tract infection, site not specified    Past Surgical History:  Procedure Laterality Date   ABDOMINAL HYSTERECTOMY     partial-still has ovaries   APPENDECTOMY  unknown   CESAREAN SECTION     x 2   CHOLECYSTECTOMY     COLONOSCOPY  2017   MS-MAC-goor prep-int/ext hem/SSP/adenoma-5 yr recall   CYSTOSCOPY  2020   POLYPECTOMY  2017   SSP/adenoma   TONSILLECTOMY  unknown     No outpatient medications have been marked as taking for the 12/11/23 encounter (Video  Visit) with Shlomo Wilbert SAUNDERS, MD.     Allergies:   Other, Sulfa  antibiotics, and Latex   Social History   Tobacco Use   Smoking status: Former    Current packs/day: 0.00    Average packs/day: 0.3 packs/day for 16.0 years (4.0 ttl pk-yrs)    Types: Cigarettes    Start date: 06/18/1988    Quit date: 06/18/2004    Years since quitting: 19.4   Smokeless tobacco: Never  Vaping Use   Vaping status: Never Used  Substance Use Topics   Alcohol use: Yes    Comment: occasionally-no specific pattern, rare-wine   Drug use: No     Family Hx: The patient's family history includes Asthma in her father; Atrial fibrillation (age of onset: 37) in her mother; Colon polyps in her father and mother; Dementia in her maternal grandmother; Depression in her son;  Hearing loss in her father; Heart attack in her maternal grandfather and paternal grandfather; Heart attack (age of onset: 1) in her father; Heart disease in her father and mother; Heart failure in her maternal grandfather; Hyperlipidemia in her father; Hypertension in her father; Kidney cancer in her father; Pancreatitis in her mother; Stroke (age of onset: 51) in her paternal grandmother. There is no history of Colon cancer, Esophageal cancer, Stomach cancer, Pancreatic cancer, or Rectal cancer.  ROS:   Please see the history of present illness.     All other systems reviewed and are negative.   Prior Sleep studies:   The following studies were reviewed today:  Home sleep stud  Labs/Other Tests and Data Reviewed:     Recent Labs: 04/02/2023: ALT 15; BUN 19; Creatinine, Ser 0.79; Hemoglobin 13.6; Platelets 250; Potassium 4.4; Sodium 138    Wt Readings from Last 3 Encounters:  10/15/23 148 lb 12.8 oz (67.5 kg)  07/09/23 151 lb 6 oz (68.7 kg)  04/22/23 151 lb (68.5 kg)     Risk Assessment/Calculations:      STOP-Bang Score:  5      Objective:    Vital Signs:  There were no vitals taken for this visit.   VITAL SIGNS:  reviewed GEN:  no acute distress EYES:  sclerae anicteric, EOMI - Extraocular Movements Intact RESPIRATORY:  normal respiratory effort, symmetric expansion CARDIOVASCULAR:  no peripheral edema SKIN:  no rash, lesions or ulcers. MUSCULOSKELETAL:  no obvious deformities. NEURO:  alert and oriented x 3, no obvious focal deficit PSYCH:  normal affect  ASSESSMENT & PLAN:    OSA - She was complaining of a lot of fatigue so a home sleep study was ordered - Sleep study showed mild obstructive sleep apnea with an AHI of 8.3/h and no significant nocturnal hypoxemia.  Most of her events occurred in the supine position with an AHI 13.6/h during supine sleep and 5.9/h during nonsupine sleep. - Treatment options were discussed including referral to sleep dentistry for  oral device versus a trial of CPAP therapy with auto CPAP from 4 to 15 cm H2O. - The oral device she feels is cost prohibitive - I recommend starting auto CPAP from 4 to 15 cm H2O with heated humidity using a nasal pillow mask - I will see her back in 6 weeks after she gets her device  Hypertension - BP controlled at home - Continue Toprol -XL 50 mg daily with as needed refills   Time:   Today, I have spent 20 minutes with the patient with telehealth technology discussing the above problems.     Medication  Adjustments/Labs and Tests Ordered: Current medicines are reviewed at length with the patient today.  Concerns regarding medicines are outlined above.   Tests Ordered: No orders of the defined types were placed in this encounter.   Medication Changes: No orders of the defined types were placed in this encounter.   Follow Up:  In Person in 6 week(s) after she gets her PAP device  Signed, Wilbert Bihari, MD  12/11/2023 11:35 AM    [facility]

## 2023-12-13 ENCOUNTER — Telehealth: Payer: Self-pay | Admitting: Pediatrics

## 2023-12-13 ENCOUNTER — Telehealth (INDEPENDENT_AMBULATORY_CARE_PROVIDER_SITE_OTHER): Admitting: Pediatrics

## 2023-12-13 DIAGNOSIS — R198 Other specified symptoms and signs involving the digestive system and abdomen: Secondary | ICD-10-CM | POA: Diagnosis not present

## 2023-12-13 DIAGNOSIS — Z860100 Personal history of colon polyps, unspecified: Secondary | ICD-10-CM | POA: Diagnosis not present

## 2023-12-13 DIAGNOSIS — R1084 Generalized abdominal pain: Secondary | ICD-10-CM

## 2023-12-13 DIAGNOSIS — K5900 Constipation, unspecified: Secondary | ICD-10-CM | POA: Diagnosis not present

## 2023-12-13 MED ORDER — CHOLESTYRAMINE 4 G PO PACK: 4.0000 g | PACK | Freq: Every day | ORAL | 1 refills | Status: DC

## 2023-12-13 NOTE — Telephone Encounter (Signed)
 Patient advised. Visit changed

## 2023-12-13 NOTE — Telephone Encounter (Signed)
 Good Morning Dr Suzann   Patient is scheduled for f/u at 3 pm today.   Inquiring if she is able to have a video visit instead of coming into office due to her taking benadryl this morning. States she has a rash from wearing and elastic band.   Please review and advise.   Thank you

## 2023-12-13 NOTE — Patient Instructions (Signed)
 We have sent the following medications to your pharmacy for you to pick up at your convenience: Cholestyramine 1 packet daily  You have been scheduled for a follow up with Dr Suzann on Tuesday 03/10/24 at 9:30 am.  _______________________________________________________  If your blood pressure at your visit was 140/90 or greater, please contact your primary care physician to follow up on this.  _______________________________________________________  If you are age 58 or older, your body mass index should be between 23-30. Your There is no height or weight on file to calculate BMI. If this is out of the aforementioned range listed, please consider follow up with your Primary Care Provider.  If you are age 32 or younger, your body mass index should be between 19-25. Your There is no height or weight on file to calculate BMI. If this is out of the aformentioned range listed, please consider follow up with your Primary Care Provider.   ________________________________________________________  The [facility] providers would like to encourage you to use MYCHART to communicate with providers for non-urgent requests or questions.  Due to long hold times on the telephone, sending your provider a message by [facility] may be a faster and more efficient way to get a response.  Please allow 48 business hours for a response.  Please remember that this is for non-urgent requests.  _______________________________________________________  Due to recent changes in healthcare laws, you may see the results of your imaging and laboratory studies on MyChart before your provider has had a chance to review them.  We understand that in some cases there may be results that are confusing or concerning to you. Not all laboratory results come back in the same time frame and the provider may be waiting for multiple results in order to interpret others.  Please give us  48 hours in order for your provider to thoroughly review  all the results before contacting the office for clarification of your results.

## 2023-12-14 ENCOUNTER — Encounter: Payer: Self-pay | Admitting: Pediatrics

## 2023-12-16 ENCOUNTER — Other Ambulatory Visit: Payer: Self-pay | Admitting: Internal Medicine

## 2023-12-17 ENCOUNTER — Telehealth: Payer: Self-pay | Admitting: Pharmacy Technician

## 2023-12-17 DIAGNOSIS — R7309 Other abnormal glucose: Secondary | ICD-10-CM | POA: Diagnosis not present

## 2023-12-17 NOTE — Telephone Encounter (Signed)
 My Chart message sent to the pt... she has not called me back.. orders placed.

## 2023-12-17 NOTE — Telephone Encounter (Signed)
   [facility] and they said this cmm was in error. They said this med was filled 12/16/23

## 2024-01-07 ENCOUNTER — Telehealth: Payer: Self-pay | Admitting: Cardiology

## 2024-01-07 NOTE — Telephone Encounter (Signed)
 Patient says she has been waiting to hear from someone regarding sleep mask. Please provide updates.

## 2024-01-16 ENCOUNTER — Other Ambulatory Visit: Payer: Self-pay | Admitting: Internal Medicine

## 2024-01-17 DIAGNOSIS — R7309 Other abnormal glucose: Secondary | ICD-10-CM | POA: Diagnosis not present

## 2024-01-20 DIAGNOSIS — L821 Other seborrheic keratosis: Secondary | ICD-10-CM | POA: Diagnosis not present

## 2024-01-20 DIAGNOSIS — L814 Other melanin hyperpigmentation: Secondary | ICD-10-CM | POA: Diagnosis not present

## 2024-01-20 DIAGNOSIS — R202 Paresthesia of skin: Secondary | ICD-10-CM | POA: Diagnosis not present

## 2024-01-24 DIAGNOSIS — E785 Hyperlipidemia, unspecified: Secondary | ICD-10-CM | POA: Diagnosis not present

## 2024-01-24 DIAGNOSIS — I10 Essential (primary) hypertension: Secondary | ICD-10-CM | POA: Diagnosis not present

## 2024-01-24 DIAGNOSIS — R400 Somnolence: Secondary | ICD-10-CM | POA: Diagnosis not present

## 2024-01-24 DIAGNOSIS — I700 Atherosclerosis of aorta: Secondary | ICD-10-CM | POA: Diagnosis not present

## 2024-01-24 DIAGNOSIS — G4733 Obstructive sleep apnea (adult) (pediatric): Secondary | ICD-10-CM | POA: Diagnosis not present

## 2024-01-24 NOTE — Telephone Encounter (Signed)
 Community message sent to [facility]; representative confirmed receipt of CPAP order today.  LMOVM (DPR) for patient. Apologized for delay and advised that she should hear back from [facility] within the next 14 business days. Requested that she call back if she doesn't hear from them. Direct number provided for questions/concerns.

## 2024-01-27 ENCOUNTER — Ambulatory Visit: Payer: Self-pay | Admitting: Internal Medicine

## 2024-01-27 DIAGNOSIS — E785 Hyperlipidemia, unspecified: Secondary | ICD-10-CM

## 2024-01-27 DIAGNOSIS — I10 Essential (primary) hypertension: Secondary | ICD-10-CM

## 2024-01-27 LAB — LIPOPROTEIN A (LPA): Lipoprotein (a): 16.4 nmol/L (ref <75.0)

## 2024-01-27 LAB — COMPREHENSIVE METABOLIC PANEL WITH GFR
ALT: 14 IU/L (ref 0–32)
AST: 14 IU/L (ref 0–40)
Albumin: 4.6 g/dL (ref 3.8–4.9)
Alkaline Phosphatase: 63 IU/L (ref 44–121)
BUN/Creatinine Ratio: 19 (ref 9–23)
BUN: 17 mg/dL (ref 6–24)
Bilirubin Total: 0.7 mg/dL (ref 0.0–1.2)
CO2: 23 mmol/L (ref 20–29)
Calcium: 9.7 mg/dL (ref 8.7–10.2)
Chloride: 104 mmol/L (ref 96–106)
Creatinine, Ser: 0.89 mg/dL (ref 0.57–1.00)
Globulin, Total: 2.2 g/dL (ref 1.5–4.5)
Glucose: 80 mg/dL (ref 70–99)
Potassium: 4.7 mmol/L (ref 3.5–5.2)
Sodium: 142 mmol/L (ref 134–144)
Total Protein: 6.8 g/dL (ref 6.0–8.5)
eGFR: 75 mL/min/1.73 (ref >59)

## 2024-01-27 LAB — TSH: TSH: 1.64 u[IU]/mL (ref 0.450–4.500)

## 2024-01-27 LAB — CBC
Hematocrit: 42.9 % (ref 34.0–46.6)
Hemoglobin: 13.7 g/dL (ref 11.1–15.9)
MCH: 29.6 pg (ref 26.6–33.0)
MCHC: 31.9 g/dL (ref 31.5–35.7)
MCV: 93 fL (ref 79–97)
Platelets: 227 x10E3/uL (ref 150–450)
RBC: 4.63 x10E6/uL (ref 3.77–5.28)
RDW: 12.8 % (ref 11.7–15.4)
WBC: 5.2 x10E3/uL (ref 3.4–10.8)

## 2024-01-27 LAB — NMR, LIPOPROFILE
Cholesterol, Total: 176 mg/dL (ref 100–199)
HDL Particle Number: 31.8 umol/L (ref >=30.5)
HDL-C: 44 mg/dL (ref >39)
LDL Particle Number: 1223 nmol/L — ABNORMAL HIGH (ref <1000)
LDL Size: 20 nm — ABNORMAL LOW (ref >20.5)
LDL-C (NIH Calc): 109 mg/dL — ABNORMAL HIGH (ref 0–99)
LP-IR Score: 60 — ABNORMAL HIGH (ref <=45)
Small LDL Particle Number: 735 nmol/L — ABNORMAL HIGH (ref <=527)
Triglycerides: 126 mg/dL (ref 0–149)

## 2024-01-27 LAB — VITAMIN D 25 HYDROXY (VIT D DEFICIENCY, FRACTURES): Vit D, 25-Hydroxy: 39.1 ng/mL (ref 30.0–100.0)

## 2024-01-28 MED ORDER — ROSUVASTATIN CALCIUM 10 MG PO TABS: 10.0000 mg | ORAL_TABLET | Freq: Every day | ORAL | Status: AC

## 2024-02-02 NOTE — Progress Notes (Unsigned)
 .    Cardiology Office Note   Date:  02/03/2024   ID:  Molly Peterson, DOB 1966/02/16, MRN [id number]  PCP:  Molly Lynwood HERO, NP  Cardiologist:   Molly Gull, MD   F/U of HTN and HL    History of Present Illness: Molly Peterson is a 58 y.o. female with a history of HTN, hyperlipidemia, very mild aortic plaquing, palpitations, chest pressure and OSA    Pt also with GI issues since choley  2019  CCTA  Ca score 0  Minimal plaquing in aorta  2022  Echo  LVEF 60 to 65%      HTN: The pt had problems with lisinopril   Had swelling of hands and feet.  Placed on aldactone   Was on HCTZ  HCTZ was stopped   Toprol  added    I saw the pt in 2023  Since then she has been seen a few times in clinic.  Last by P Nahser   At the time she complained of fatigue and progressive SOB  Phil though a lot of her symptoms due to dietary choices and processed foods    Summer 2025  Set up for sleep study which showed mild OSA  Seen by ONEIDA Bihari   REcomm CPAP  STill waiting on this  The pt just went to [employer] she walked every day   Ate fish, ate well  No snacking   Symptoms above went away  Bowels OK    Came back home and symtpoms returned     I spoke to her last week to review labs   REcomm increasing Crestor     Current Meds  Medication Sig   albuterol  (VENTOLIN  HFA) 108 (90 Base) MCG/ACT inhaler Inhale 2 puffs into the lungs every 6 (six) hours as needed for wheezing or shortness of breath.   ALPRAZolam  (XANAX ) 0.25 MG tablet Take 1 tablet (0.25 mg total) by mouth daily as needed for anxiety.   cholestyramine  (QUESTRAN ) 4 g packet Take 1 packet (4 g total) by mouth daily.   estradiol  (ESTRACE ) 0.1 MG/GM vaginal cream Place 0.5 g vaginally as needed.   fluticasone  (FLONASE ) 50 MCG/ACT nasal spray Place 1 spray into both nostrils daily. (Patient taking differently: Place 1 spray into both nostrils as needed for allergies or rhinitis.)   metoprolol  succinate (TOPROL -XL) 50 MG 24 hr tablet TAKE ONE TABLET  BY MOUTH EVERY DAY IMMEDIATELY FOLLOWING A MEAL   MIEBO 1.338 GM/ML SOLN Place 1 drop into both eyes 4 (four) times daily.   rosuvastatin  (CRESTOR ) 10 MG tablet Take 1 tablet (10 mg total) by mouth daily.     Allergies:   Other, Sulfa  antibiotics, and Latex   Past Medical History:  Diagnosis Date   Acute pharyngitis    ANXIETY 09/18/2006   on PRN meds for situational anxiety   Chronic kidney disease    hx kidney stones   DEPRESSION 09/18/2006   DERMATITIS, SEBORRHEIC NEC 01/06/2007   Edema    symptom   Flatulence, eructation, and gas pain    Hyperlipidemia    on meds   HYPERTENSION 09/18/2006   on meds   Osteopenia    Palpitations 09/18/2006   REACTION, ACUTE STRESS W/EMOTIONAL DSTURB 12/23/2006   Seasonal allergies    Unspecified constipation    Urinary tract infection, site not specified     Past Surgical History:  Procedure Laterality Date   ABDOMINAL HYSTERECTOMY     partial-still has ovaries   APPENDECTOMY  unknown   CESAREAN SECTION     x 2   CHOLECYSTECTOMY     COLONOSCOPY  2017   MS-MAC-goor prep-int/ext hem/SSP/adenoma-5 yr recall   CYSTOSCOPY  2020   POLYPECTOMY  2017   SSP/adenoma   TONSILLECTOMY  unknown     Social History:  The patient  reports that she quit smoking about 19 years ago. Her smoking use included cigarettes. She started smoking about 35 years ago. She has a 4 pack-year smoking history. She has never used smokeless tobacco. She reports current alcohol use. She reports that she does not use drugs.   Family History:  The patient's family history includes Asthma in her father; Atrial fibrillation (age of onset: 17) in her mother; Colon polyps in her father and mother; Dementia in her maternal grandmother; Depression in her son; Hearing loss in her father; Heart attack in her maternal grandfather and paternal grandfather; Heart attack (age of onset: 33) in her father; Heart disease in her father and mother; Heart failure in her maternal  grandfather; Hyperlipidemia in her father; Hypertension in her father; Kidney cancer in her father; Pancreatitis in her mother; Stroke (age of onset: 23) in her paternal grandmother.    ROS:  Please see the history of present illness. All other systems are reviewed and  Negative to the above problem except as noted.    PHYSICAL EXAM: VS:  BP 132/70   Pulse 66   Ht 5' 3 (1.6 m)   Wt 150 lb 3.2 oz (68.1 kg)   SpO2 98%   BMI 26.61 kg/m   GEN: Well nourished, well developed, in no acute distress  HEENT: normal  Neck: no JVD; no carotid bruits Cardiac: RRR; no murmur  Respiratory:  clear to auscultation  GI: soft, No masses    MS: no deformity Moving all extremities   Extt  No LE edema    EKG:  EKG is not ordered today      Lipid Panel    Component Value Date/Time   CHOL 165 06/14/2021 0941   TRIG 211 (H) 06/14/2021 0941   HDL 44 06/14/2021 0941   CHOLHDL 3.8 06/14/2021 0941   CHOLHDL 4.8 06/01/2016 1534   VLDL 32 (H) 06/01/2016 1534   LDLCALC 85 06/14/2021 0941   LDLDIRECT 140.0 07/21/2014 0839      Wt Readings from Last 3 Encounters:  02/03/24 150 lb 3.2 oz (68.1 kg)  12/11/23 142 lb (64.4 kg)  10/15/23 148 lb 12.8 oz (67.5 kg)      ASSESSMENT AND PLAN:  1  HTN   Pt says BP is good at home  120s/  FOllow      2  Lipids   Recent lipids Lpa 16  LDL 109  Small particle 735    Recomm increasing Crestor  to 10 but also reviewed diet   Needs to cut out UPF that she is eating a lot of   May need to pull back if makes these changes   3  Chest discomfort   Denies     4  PAD   Minimal plaquing on CT scan       5  GI   Very irritbable since choley   I really think she needs to modify diet      6  OSA   Mild  Waiting on CPAP  DIscussed lifestyle modifications to achieve symptom ctonrol    She will try to employ  [employer] at [employer] which has a new wellness center  Current medicines are reviewed at length with the patient today.  The patient does not have concerns  regarding medicines.  Signed, Molly Gull, MD  02/03/2024 9:22 AM    [facility] [address] [place], [place], [place]  [address] Phone: [phone]; Fax: [phone]

## 2024-02-03 ENCOUNTER — Encounter: Payer: Self-pay | Admitting: Internal Medicine

## 2024-02-03 ENCOUNTER — Ambulatory Visit: Attending: Internal Medicine | Admitting: Internal Medicine

## 2024-02-03 VITALS — BP 132/70 | HR 66 | Ht 63.0 in | Wt 150.2 lb

## 2024-02-03 DIAGNOSIS — I10 Essential (primary) hypertension: Secondary | ICD-10-CM

## 2024-02-03 NOTE — Patient Instructions (Signed)
 Medication Instructions:  Your physician recommends that you continue on your current medications as directed. Please refer to the Current Medication list given to you today.  *If you need a refill on your cardiac medications before your next appointment, please call your pharmacy*  Lab Work: TODAY: LFTs, Apo B, NMR, Hgb A1c If you have labs (blood work) drawn today and your tests are completely normal, you will receive your results only by: MyChart Message (if you have MyChart) OR A paper copy in the mail If you have any lab test that is abnormal or we need to change your treatment, we will call you to review the results.  Follow-Up: At [facility], you and your health needs are our priority.  As part of our continuing mission to provide you with exceptional heart care, our providers are all part of one team.  This team includes your primary Cardiologist (physician) and Advanced Practice Providers or APPs (Physician Assistants and Nurse Practitioners) who all work together to provide you with the care you need, when you need it.  Your next appointment:   1 year(s)  Provider:   Vina Gull, MD  We recommend signing up for the patient portal called MyChart.  Sign up information is provided on this After Visit Summary.  MyChart is used to connect with patients for Virtual Visits (Telemedicine).  Patients are able to view lab/test results, encounter notes, upcoming appointments, etc.  Non-urgent messages can be sent to your provider as well.    To learn more about what you can do with MyChart, go to [email or web address].

## 2024-02-05 DIAGNOSIS — Z01419 Encounter for gynecological examination (general) (routine) without abnormal findings: Secondary | ICD-10-CM | POA: Diagnosis not present

## 2024-02-05 DIAGNOSIS — Z6827 Body mass index (BMI) 27.0-27.9, adult: Secondary | ICD-10-CM | POA: Diagnosis not present

## 2024-02-17 DIAGNOSIS — R7309 Other abnormal glucose: Secondary | ICD-10-CM | POA: Diagnosis not present

## 2024-02-21 ENCOUNTER — Other Ambulatory Visit: Payer: Self-pay | Admitting: Internal Medicine

## 2024-02-28 ENCOUNTER — Other Ambulatory Visit: Payer: Self-pay | Admitting: Nurse Practitioner

## 2024-02-28 DIAGNOSIS — Z1231 Encounter for screening mammogram for malignant neoplasm of breast: Secondary | ICD-10-CM

## 2024-03-06 DIAGNOSIS — M5416 Radiculopathy, lumbar region: Secondary | ICD-10-CM | POA: Diagnosis not present

## 2024-03-09 ENCOUNTER — Telehealth: Payer: Self-pay | Admitting: Pediatrics

## 2024-03-09 NOTE — Telephone Encounter (Signed)
 Appt changed to MyChart video visit. MyChart message sent to patient.

## 2024-03-09 NOTE — Telephone Encounter (Signed)
 Inbound call from patient stating that she has some severe spinal irritated in the lower lumbar and she doe snot believe she can come in tomorrow. Patient is wanting to know if Dr. Suzann is willing to a Virtual consult. Patient states that she is in severe pain and the medication that was given to her has not help and she lives 30 minutes from here. Patient is requesting a call back. please advise.

## 2024-03-09 NOTE — Progress Notes (Unsigned)
 [facility] Return Visit   Referring Provider Wendee Lynwood HERO, NP [address] [place],  [place] [address]  Primary Care Provider Wendee, Lynwood HERO, NP  Patient Profile: Molly Peterson is a 58 y.o. female who returns to the [facility] for follow-up of the problem(s) noted below.  Problem List: Suspected IBS-C leading to abdominal pain. Constipation, incomplete fecal evacuation Mild colon diverticulosis S/P cholecystectomy, appendectomy, hysterectomy Personal history of sessile serrated adenomatous, tubular adenomatous colon polyps Partial pancreas divisum on CT imaging   History of Present Illness   Ms. Morea was last seen in the GI office 07/09/2023  Today she participates in a video visit  Current GI Meds  Dicyclomine  10-20 mg p.o. 3 times daily as needed abdominal pain -not taking as she states it does not help Omeprazole  20 mg orally daily Sucralfate  1 g p.o. 4 times daily  Interval History   History obtained at the time of Molly Peterson's last visit included the following:  Molly Peterson returns to the office today for follow-up of irritable bowel syndrome  She informs me that her gastrointestinal problems began in 2020 when she was experiencing significant abdominal pain During that evaluation she was found to have gallbladder polyps prompting cholecystectomy Since that time she has experienced irregular bowel habits with infrequent stools that are typically hard soft/thick in nature but not diarrhea  Currently reports skipping several days in a row going to the bathroom When she is constipated she will take over-the-counter stool softeners as well as a Slim fast shake States that Slim fast shake contains a high amount of fiber and seems to prompt bowel movements Stools are described as being thick in consistency and can then occur several times a day; stools are never hard No blood or mucus in her stool Endorses some straining with  defecation  Reports consuming a high amount of fiber in her diet Eats oatmeal, yogurt, berries, bananas, oranges, whole-wheat bread, turnip greens and beans Stays well-hydrated with water Has also tried fiber supplements that have not been beneficial  At the time of her last appointment was advised to increase dicyclomine  from 10 mg p.o. 3 times daily to 20 mg p.o. 3 times daily She does not feel that this has been beneficial  Describes upper abdominal pain that can awaken her from sleep 3-4 nights a week Reviewed that this could represent constipation  Reports various sources of stress related to her busy job at [employer] and caring for her adult son with narcolepsy  Interval history: At the conclusion of Rafeef's last visit she was prescribed Linzess  72 mcg orally daily, advised to continue high-fiber diet and Slim fast shake Reports she did not note much difference with Linzess  72 mcg dosing -when increased to 145 mcg that resulted in diarrhea and she therefore stopped it Continues to endorse a sense of not emptying her bowels completely Describes her stools as being soft and sometimes loose in consistency with an oily layer -not necessarily hard -she worries about long-term impact of this Denies straining to defecate or other symptoms of pelvic floor dysfunction Has symptoms of fecal urgency that can be problematic when she is attempting to eat out -seems worse when dining at restaurants As above, the use of antispasmodics has not mitigated this Symptoms appear to be impacting quality of life  States that her mother has had significant GI issues and worries about a similar pattern for herself Mother had what may have also been pancreatic disease him leading  to pancreatitis Kiondra has not had a documented history of pancreatitis Most recent cross-sectional imaging performed in 2022 did not show any pancreatic abnormalities   Reports that her abdominal pain seems to be better with the  use of Gaviscon She is taking Gaviscon as needed 2-3 times per month Not endorsing nausea or vomiting  At the time of today's visit, Alda reports having a recent allergic reaction developing hives around her neck that have spread to the rest of her body She initially thought it was related to wearing an elastic necklace but now queries if there may be another propagating factor Has taken Benadryl but still having significant itching No respiratory issues  Last colonoscopy: 06/2021 - Two 7-8 mm polyps in [facility], left colon diverticulosis, IH/EH Last endoscopy: None  Last Abd CT/CTE/MRE: CTAP 06/2020 - normal  GI Review of Symptoms Significant for abdominal pain, constipation. Otherwise negative.  General Review of Systems  Review of systems is significant for the pertinent positives and negatives as listed per the HPI.  Full ROS is otherwise negative.  Past Medical History   Past Medical History:  Diagnosis Date   Acute pharyngitis    ANXIETY 09/18/2006   on PRN meds for situational anxiety   Chronic kidney disease    hx kidney stones   DEPRESSION 09/18/2006   DERMATITIS, SEBORRHEIC NEC 01/06/2007   Edema    symptom   Flatulence, eructation, and gas pain    Hyperlipidemia    on meds   HYPERTENSION 09/18/2006   on meds   Osteopenia    Palpitations 09/18/2006   REACTION, ACUTE STRESS W/EMOTIONAL DSTURB 12/23/2006   Seasonal allergies    Unspecified constipation    Urinary tract infection, site not specified      Past Surgical History   Past Surgical History:  Procedure Laterality Date   ABDOMINAL HYSTERECTOMY     partial-still has ovaries   APPENDECTOMY  unknown   CESAREAN SECTION     x 2   CHOLECYSTECTOMY     COLONOSCOPY  2017   MS-MAC-goor prep-int/ext hem/SSP/adenoma-5 yr recall   CYSTOSCOPY  2020   POLYPECTOMY  2017   SSP/adenoma   TONSILLECTOMY  unknown     Allergies and Medications   Allergies  Allergen Reactions   Other Anaphylaxis, Hives and  Other (See Comments)    Lobster cause large blister on tongue   Sulfa  Antibiotics Other (See Comments) and Hives    Dizziness   Latex Itching and Swelling    Eye and facial swelling    No outpatient medications have been marked as taking for the 03/10/24 encounter (Appointment) with Suzann Inocente HERO, MD.     Family History   Family History  Problem Relation Age of Onset   Colon polyps Mother    Heart disease Mother        PVC @age  13   Atrial fibrillation Mother 45   Pancreatitis Mother    Colon polyps Father    Heart disease Father        MI @ags  18 , CABG @55    Hypertension Father    Hyperlipidemia Father    Hearing loss Father    Heart attack Father 62   Asthma Father    Kidney cancer Father    Dementia Maternal Grandmother    Heart failure Maternal Grandfather    Heart attack Maternal Grandfather    Stroke Paternal Grandmother 52   Heart attack Paternal Grandfather        young  Depression Son    Colon cancer Neg Hx    Esophageal cancer Neg Hx    Stomach cancer Neg Hx    Pancreatic cancer Neg Hx    Rectal cancer Neg Hx    Social History   Social History   Tobacco Use   Smoking status: Former    Current packs/day: 0.00    Average packs/day: 0.3 packs/day for 16.0 years (4.0 ttl pk-yrs)    Types: Cigarettes    Start date: 06/18/1988    Quit date: 06/18/2004    Years since quitting: 19.7   Smokeless tobacco: Never  Vaping Use   Vaping status: Never Used  Substance Use Topics   Alcohol use: Yes    Comment: occasionally-no specific pattern, rare-wine   Drug use: No   Kerstin reports that she quit smoking about 19 years ago. Her smoking use included cigarettes. She started smoking about 35 years ago. She has a 4 pack-year smoking history. She has never used smokeless tobacco. She reports current alcohol use. She reports that she does not use drugs.  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[employer], 1 son and 1 daughter  Vital Signs and  Physical Examination   No PE - video visit  Review of Data  The following data was reviewed at the time of this encounter:  Laboratory Studies      Latest Ref Rng & Units 01/24/2024   11:43 AM 04/02/2023    1:25 PM 09/12/2022   10:47 AM  CBC  WBC 3.4 - 10.8 x10E3/uL 5.2  7.8  5.4   Hemoglobin 11.1 - 15.9 g/dL 86.2  86.3  86.1   Hematocrit 34.0 - 46.6 % 42.9  41.9  41.1   Platelets 150 - 450 x10E3/uL 227  250  214     Lab Results  Component Value Date   LIPASE 43 04/02/2023      Latest Ref Rng & Units 01/24/2024   11:43 AM 04/02/2023    1:25 PM 09/12/2022   10:47 AM  CMP  Glucose 70 - 99 mg/dL 80  89  75   BUN 6 - 24 mg/dL 17  19  18    Creatinine 0.57 - 1.00 mg/dL 9.10  9.20  9.21   Sodium 134 - 144 mmol/L 142  138  140   Potassium 3.5 - 5.2 mmol/L 4.7  4.4  4.7   Chloride 96 - 106 mmol/L 104  100  101   CO2 20 - 29 mmol/L 23  21  29    Calcium  8.7 - 10.2 mg/dL 9.7  9.4  9.7   Total Protein 6.0 - 8.5 g/dL 6.8  7.0  6.5   Total Bilirubin 0.0 - 1.2 mg/dL 0.7  0.4  0.4   Alkaline Phos 44 - 121 IU/L 63  72  70   AST 0 - 40 IU/L 14  18  15    ALT 0 - 32 IU/L 14  15  13     Lab Results  Component Value Date   TSH 1.640 01/24/2024   H. pylori breath test 04/02/2023-negative   Imaging Studies  CT AP 06/30/2020 1. No acute abnormality. No evidence of bowel obstruction or acute bowel inflammation. Mild diffuse colonic diverticulosis, with no evidence of acute diverticulitis. 2. Aortic Atherosclerosis (ICD10-I70.0).  Abdominal ultrasound 05/29/2019 1. 7 mm and 2 mm gallbladder polyps. 2. Otherwise, unremarkable examination.  CT AP 01/26/2019 1. 5 by 5 by 3 mm left proximal ureteral calculus associated with  borderline left hydronephrosis, but with contrast medium extending distal to the stone on delayed images. 2. 7 by 5 by 5 mm non-dependent filling defect in the gallbladder on image 23/2. This is most likely a polyp, less likely adherent gallstone. Follow up sonography  is recommended in 6 months time. This recommendation complies with the 2017 joint guidelines between the European Society of Gastrointestinal and Abdominal Radiology (ESGAR), European Association for Endoscopic Surgery and other Interventional Techniques (EAES), International Society of Digestive Surgery - European Federation (EFISDS) and European Society of Gastrointestinal Endoscopy (ESGE). 3.  Aortic Atherosclerosis (ICD10-I70.0). 4. Mild sigmoid colon diverticulosis. 5. At least partial pancreas divisum.  GI Procedures and Studies  Colonoscopy 06/28/2021 Two 7-8 mm polyps in [facility], left colon diverticulosis, IH/EH Path: TA and HP  Colonoscopy 05/15/2016 One 7 mm polyp in TC, IH/EH Path: SSA   Clinical Impression  It is my clinical impression that Ms. Molly Peterson is a 58 y.o. female with;  Suspected IBS-C leading to abdominal pain. Constipation, incomplete fecal evacuation Mild colon diverticulosis S/P cholecystectomy, appendectomy, hysterectomy Personal history of sessile serrated adenomatous, tubular adenomatous colon polyps Partial pancreatic divisum on CT imaging  Molly Peterson participated in a video visit today for follow-up of irritable bowel syndrome that appears to be constipation predominant.  She reports that she developed irregular bowel habits after cholecystectomy for gallbladder polyps in 2020.  She describes having infrequent stools but when she does defecate a sense of incomplete evacuation and thick stools/loose stools.  At the time of her last visit I suggested a trial of Linzess .  She did not find benefit from the 72 mcg low dose and subsequently developed diarrhea on a higher dose of 145 mcg.  At today's visit she describes more concern about the consistency of her stools being looser in nature and less so bowel frequency.  This could potentially be related to her previous cholecystectomy.  I recommended trial of low-dose cholestyramine  4 mg daily.  We also discussed other  prescription options for constipation which she is not inclined to pursue at this time.  Lean previously endorsed abdominal pain in her upper mid abdomen that can awaken her 3-4 nights of the week.  She did not found dicyclomine  beneficial and feels that the dosing regimen 3 times a day is inconvenient.  More recently she has utilize Gaviscon which has ameliorated her abdominal discomfort.  She is using this 2-3 times per month.  She expresses concern regarding a family history and personal history of pancreatic divisum.  Cross-sectional imaging has not shown other pancreatic abnormalities.  She does not have a history of pancreatitis.  At this juncture her symptoms are not necessarily consistent with pancreatic exocrine insufficiency.  This can be monitored conservatively over time.  Plan  Trial of cholestyramine  4 g packet p.o. once daily to try to improve the consistency of her stools which she describes as being soft and oily If bowel frequency remains an issue can explore other options of senna, smooth move tea, Trulance, Motegrity, Ibsrela Continue high-fiber diet and the use to Slim fast shakes. Continue Gaviscon as needed Surveillance colonoscopy due January 2030  Planned Follow Up 3-4 months  The patient or caregiver verbalized understanding of the material covered, with no barriers to understanding. All questions were answered. Patient or caregiver is agreeable with the plan outlined above.    It was a pleasure to see Molly Peterson.  If you have any questions or concerns regarding this evaluation, do not hesitate to contact me.  Inocente  Suzann, MD [facility]   This video encounter was conducted with the patient's (or proxy's) verbal consent via secure, interactive audio and video telecommunications while I was  in the [facility] office  The patient (or proxy) was instructed to have this encounter in a suitably private space and to only have persons present  to whom they give permission to participate.  The patient confirmed he was in her home residence at the time of this encounter.  In addition, patient identity was confirmed by use of name plus an additional identifier.  I spent total of 30 minutes in both face-to-face (20 minutes interview) and non-face-to-face (10 minutes chart review, care coordination, documentation) activities, excluding procedures performed, for the visit on the date of this encounter.

## 2024-03-10 ENCOUNTER — Telehealth (INDEPENDENT_AMBULATORY_CARE_PROVIDER_SITE_OTHER): Admitting: Pediatrics

## 2024-03-10 DIAGNOSIS — K582 Mixed irritable bowel syndrome: Secondary | ICD-10-CM

## 2024-03-12 ENCOUNTER — Encounter: Payer: Self-pay | Admitting: Pediatrics

## 2024-03-13 ENCOUNTER — Telehealth: Payer: Self-pay

## 2024-03-13 NOTE — Telephone Encounter (Signed)
-----   Message from Inocente CHRISTELLA Hausen sent at 03/12/2024  9:07 PM EDT ----- Regarding: Return clinic visit I saw Ms. Porzio via video visit earlier this week.  Please schedule her for a follow-up visit for IBS with me or an APP in 6 to 12 months  Thanks,  Inocente

## 2024-03-13 NOTE — Telephone Encounter (Signed)
 Reminder placed for message below.  Please contact pt and schedule pt as requested.

## 2024-03-18 DIAGNOSIS — R7309 Other abnormal glucose: Secondary | ICD-10-CM | POA: Diagnosis not present

## 2024-03-20 ENCOUNTER — Ambulatory Visit
Admission: RE | Admit: 2024-03-20 | Discharge: 2024-03-20 | Disposition: A | Discharge status: Home / Self Care | Source: Ambulatory Visit | Attending: Nurse Practitioner | Admitting: Nurse Practitioner

## 2024-03-20 DIAGNOSIS — Z1231 Encounter for screening mammogram for malignant neoplasm of breast: Secondary | ICD-10-CM

## 2024-03-27 ENCOUNTER — Ambulatory Visit: Payer: Self-pay | Admitting: Nurse Practitioner

## 2024-04-13 DIAGNOSIS — I10 Essential (primary) hypertension: Secondary | ICD-10-CM | POA: Diagnosis not present

## 2024-04-13 DIAGNOSIS — G4733 Obstructive sleep apnea (adult) (pediatric): Secondary | ICD-10-CM | POA: Diagnosis not present

## 2024-04-18 DIAGNOSIS — R7309 Other abnormal glucose: Secondary | ICD-10-CM | POA: Diagnosis not present

## 2024-04-20 ENCOUNTER — Encounter: Payer: Self-pay | Admitting: Medical

## 2024-04-20 ENCOUNTER — Ambulatory Visit (INDEPENDENT_AMBULATORY_CARE_PROVIDER_SITE_OTHER): Payer: Self-pay | Admitting: Medical

## 2024-04-20 ENCOUNTER — Other Ambulatory Visit: Payer: Self-pay

## 2024-04-20 VITALS — BP 110/90 | HR 87 | Temp 97.2°F | Ht 63.0 in | Wt 149.0 lb

## 2024-04-20 DIAGNOSIS — R051 Acute cough: Secondary | ICD-10-CM

## 2024-04-20 DIAGNOSIS — J069 Acute upper respiratory infection, unspecified: Secondary | ICD-10-CM

## 2024-04-20 MED ORDER — PROMETHAZINE-DM 6.25-15 MG/5ML PO SYRP: 5.0000 mL | ORAL_SOLUTION | Freq: Four times a day (QID) | ORAL | 0 refills | Status: DC | PRN

## 2024-04-20 NOTE — Progress Notes (Signed)
 [facility] [address] [place], [place] [address]   Office Visit Note  Patient Name: Molly Peterson Date of Birth [id number]  Medical Record number [id number]  Date of Service: 04/20/2024  Chief Complaint  Patient presents with   Acute Visit    Patient c/o persistent cough with mucus production and rhinorrhea since last Thursday. She states coughing so much has caused her to have a headache at times. She tried Nyquil and Delsym. Today she has been having chest tightness and SOB. She took Mucinex  DM this morning. Patient states she has a hx of getting bronchitis.     HPI Discussed the use of AI scribe software for clinical note transcription with the patient, who gave verbal consent to proceed.  History of Present Illness Molly Peterson is a 58 year old female who presents with persistent cough and respiratory symptoms.  She has experienced a persistent cough and rhinorrhea since Thursday (4 days ago), with symptoms worsening at night. Initially, she had a sore throat and fatigue. Her symptoms improved slightly two days ago but worsened that evening, with frequent coughing through the night.  The cough is mildly productive with occasional yellow sputum. Mild nasal d/c, clear or yellow. No fever, chills or significant sinus pressure, though she did have a headache from excessive coughing. No body aches.  She experienced a sensation of chest heaviness and difficulty breathing during a coughing episode this morning when getting ready for work in a steamy bathroom, which improved after few minutes and use of albuterol  inhaler. No other shortness of breath. Throat is raw from coughing.  She has tried various over-the-counter medications, including NyQuil, Tylenol  Cold and Flu, Delsym, and Mucinex  DM, with little relief. Inhaler helps more with chest heaviness, less with cough, when tried. Drinking tea with honey and lemon provides temporary relief.  Her husband  began coughing a few days before her and is still experiencing symptoms.   Former smoker, quit many years ago. Developed bronchitis after COVID-19 dx couple years ago. Denies hx of COPD or asthma.  Current Medication:  Outpatient Encounter Medications as of 04/20/2024  Medication Sig   albuterol  (VENTOLIN  HFA) 108 (90 Base) MCG/ACT inhaler Inhale 2 puffs into the lungs every 6 (six) hours as needed for wheezing or shortness of breath.   ALPRAZolam  (XANAX ) 0.25 MG tablet Take 1 tablet (0.25 mg total) by mouth daily as needed for anxiety.   estradiol  (ESTRACE ) 0.1 MG/GM vaginal cream Place 0.5 g vaginally as needed.   fluticasone  (FLONASE ) 50 MCG/ACT nasal spray Place 1 spray into both nostrils daily. (Patient taking differently: Place 1 spray into both nostrils daily as needed.)   metoprolol  succinate (TOPROL -XL) 50 MG 24 hr tablet TAKE ONE TABLET BY MOUTH ONCE DAILY IMMEDIATELY FOLLOWING A MEAL   MIEBO 1.338 GM/ML SOLN Place 1 drop into both eyes 4 (four) times daily.   risedronate (ACTONEL) 150 MG tablet Take 150 mg by mouth every 30 (thirty) days.   rosuvastatin  (CRESTOR ) 10 MG tablet Take 1 tablet (10 mg total) by mouth daily.   [DISCONTINUED] cholestyramine  (QUESTRAN ) 4 g packet Take 1 packet (4 g total) by mouth daily.   [DISCONTINUED] dicyclomine  (BENTYL ) 10 MG capsule Take 1 capsule (10 mg total) by mouth 3 (three) times daily before meals. (Patient not taking: Reported on 02/03/2024)   [DISCONTINUED] linaclotide  (LINZESS ) 72 MCG capsule Take 1 capsule (72 mcg total) by mouth daily before breakfast. (Patient not taking: Reported on 02/03/2024)   No  facility-administered encounter medications on file as of 04/20/2024.      Medical History: Past Medical History:  Diagnosis Date   Acute pharyngitis    ANXIETY 09/18/2006   on PRN meds for situational anxiety   Chronic kidney disease    hx kidney stones   DEPRESSION 09/18/2006   DERMATITIS, SEBORRHEIC NEC 01/06/2007   Edema     symptom   Flatulence, eructation, and gas pain    Hyperlipidemia    on meds   HYPERTENSION 09/18/2006   on meds   Osteopenia    Palpitations 09/18/2006   REACTION, ACUTE STRESS W/EMOTIONAL DSTURB 12/23/2006   Seasonal allergies    Unspecified constipation    Urinary tract infection, site not specified      Vital Signs: BP (!) 110/90   Pulse 87   Temp (!) 97.2 F (36.2 C)   Ht 5' 3 (1.6 m)   Wt 149 lb (67.6 kg)   SpO2 97%   BMI 26.39 kg/m    Review of Systems  Constitutional:  Positive for fatigue. Negative for chills and fever.  HENT:  Positive for congestion, rhinorrhea and sore throat. Negative for sinus pressure and trouble swallowing.   Respiratory:  Positive for cough and shortness of breath (episode this morning, not otherwise).   Cardiovascular:        Brief feeling of heavy chest this AM, no other chest pain/discomfort.  Musculoskeletal:  Negative for myalgias and neck pain.  Neurological:  Positive for headaches.    Physical Exam Vitals reviewed.  Constitutional:      General: She is not in acute distress.    Appearance: She is not ill-appearing.     Comments: Tired appearing  HENT:     Head: Normocephalic.     Right Ear: Ear canal and external ear normal.     Left Ear: Ear canal and external ear normal.     Ears:     Comments: TMs slightly dull    Nose: Mucosal edema present. No congestion or rhinorrhea.     Mouth/Throat:     Mouth: Mucous membranes are moist. No oral lesions.     Pharynx: Posterior oropharyngeal erythema (mild) present. No pharyngeal swelling.     Comments: Tonsils surgically absent Cardiovascular:     Rate and Rhythm: Normal rate and regular rhythm.     Heart sounds: No murmur heard.    No friction rub. No gallop.  Pulmonary:     Effort: Pulmonary effort is normal. No respiratory distress.     Breath sounds: Normal breath sounds. No decreased air movement. No wheezing, rhonchi or rales.     Comments: Intermittent dry  cough Musculoskeletal:     Cervical back: Neck supple. No rigidity.  Lymphadenopathy:     Cervical: No cervical adenopathy.  Neurological:     Mental Status: She is alert.     Assessment/Plan: 1. Acute upper respiratory infection (Primary) 2. Acute cough Suspect viral. Low suspicion for COVID-19/flu. Will trial Promethazine-DM at night, discussed Mucinex  DM during day. Continue albuterol  inhaler as needed for chest pressure/tightness. Encouraged supportive measures (rest, hydration, cough drops, honey, tea). Encouraged to follow up if new/worsening symptoms develop (i.e. fever, recurrent/persistent shortness of breath or chest discomfort) or if symptoms not improving with recommended treatment over next 5-7 days.   - promethazine-dextromethorphan (PROMETHAZINE-DM) 6.25-15 MG/5ML syrup; Take 5 mLs by mouth 4 (four) times daily as needed for cough.  Dispense: 118 mL; Refill: 0  Patient Instructions  -Rest and stay well  hydrated (by drinking water and other liquids). -Try Promethazine-DM syrup at bedtime. Continue Mucinex  DM during day. Take Tylenol  for sore throat or headache as needed. -Use Flonase /Fluticasone  nasal spray, 2 sprays to each nostril once a day. -Use albuterol  inhaler every 4-6 hours if needed for shortness of breath, wheezing, chest tightness or persistent cough. -For your sore throat/cough, use cough drops/throat lozenges, gargle warm salt water and/or drink warm liquids (like tea with honey). -Call, send MyChart message to provider or schedule return visit as needed for new/worsening symptoms (fever, recurrent/persistent shortness of breath or chest discomfort) or if symptoms do not improve as discussed with recommended treatment over next 5-7 days.     General Counseling: fatina sprankle understanding of the findings of todays visit and plan of treatment. she has been encouraged to call the office with any questions or concerns that should arise related to todays  visit.    Time spent:20 Minutes    Joen Arts PA-C Physician Assistant

## 2024-04-20 NOTE — Patient Instructions (Addendum)
-  Rest and stay well hydrated (by drinking water and other liquids). -Try Promethazine-DM syrup at bedtime. Continue Mucinex  DM during day. Take Tylenol  for sore throat or headache as needed. -Use Flonase /Fluticasone  nasal spray, 2 sprays to each nostril once a day. -Use albuterol  inhaler every 4-6 hours if needed for shortness of breath, wheezing, chest tightness or persistent cough. -For your sore throat/cough, use cough drops/throat lozenges, gargle warm salt water and/or drink warm liquids (like tea with honey). -Call, send MyChart message to provider or schedule return visit as needed for new/worsening symptoms (fever, recurrent/persistent shortness of breath or chest discomfort) or if symptoms do not improve as discussed with recommended treatment over next 5-7 days.

## 2024-04-21 ENCOUNTER — Other Ambulatory Visit: Payer: Self-pay | Admitting: Medical

## 2024-04-21 DIAGNOSIS — R051 Acute cough: Secondary | ICD-10-CM

## 2024-04-21 DIAGNOSIS — J069 Acute upper respiratory infection, unspecified: Secondary | ICD-10-CM

## 2024-04-21 MED ORDER — GUAIFENESIN-CODEINE 100-10 MG/5ML PO SOLN: 5.0000 mL | Freq: Every evening | ORAL | 0 refills | Status: DC | PRN

## 2024-04-21 NOTE — Progress Notes (Signed)
 Patient called stating she did not sleep last night despite taking two doses of Promethazine-DM cough syrup prescribed yesterday. She was under the impression she was being prescribed cough medicine with codeine . Also had nosebleed last night (one side only), noting some blood when blows nose since then. No fever/chills, shortness of breath or chest discomfort.   She does not currently take any opioid pain medicines. She is prescribed Xanax  PRN but takes it rarely.  Agreed to prescribe Guaifenesin  with codeine  for bedtime use next few days. Continue Dayquil or Mucinex  DM during day. Discussed humidifier, vaseline/Aquaphor inside nostrils at night, saline nasal spray and drinking plenty of water.  Patient will follow up as needed with new/worsening/persistent symptoms.

## 2024-04-22 ENCOUNTER — Other Ambulatory Visit: Payer: Self-pay | Admitting: Medical

## 2024-04-22 DIAGNOSIS — J069 Acute upper respiratory infection, unspecified: Secondary | ICD-10-CM

## 2024-04-22 DIAGNOSIS — R051 Acute cough: Secondary | ICD-10-CM

## 2024-04-22 MED ORDER — GUAIFENESIN-CODEINE 100-10 MG/5ML PO SOLN
5.0000 mL | Freq: Every evening | ORAL | 0 refills | Status: DC | PRN
Start: 2024-04-22 — End: 2024-04-22

## 2024-04-22 MED ORDER — GUAIFENESIN-CODEINE 100-10 MG/5ML PO SOLN: 5.0000 mL | Freq: Every evening | ORAL | 0 refills | Status: AC | PRN

## 2024-04-22 NOTE — Progress Notes (Unsigned)
 Patient called. Pharmacy did not receive cough syrup prescription yesterday. Prescription re-sent and patient notified.

## 2024-04-23 ENCOUNTER — Encounter: Payer: Self-pay | Admitting: Adult Health

## 2024-04-23 ENCOUNTER — Ambulatory Visit
Admission: RE | Admit: 2024-04-23 | Discharge: 2024-04-23 | Disposition: A | Discharge status: Home / Self Care | Source: Ambulatory Visit | Attending: Adult Health | Admitting: Adult Health

## 2024-04-23 ENCOUNTER — Ambulatory Visit: Payer: Self-pay | Admitting: Adult Health

## 2024-04-23 ENCOUNTER — Ambulatory Visit
Admission: RE | Admit: 2024-04-23 | Discharge: 2024-04-23 | Disposition: A | Discharge status: Home / Self Care | Attending: Adult Health | Admitting: Adult Health

## 2024-04-23 VITALS — BP 114/82 | HR 77 | Temp 96.4°F

## 2024-04-23 DIAGNOSIS — R051 Acute cough: Secondary | ICD-10-CM

## 2024-04-23 DIAGNOSIS — R52 Pain, unspecified: Secondary | ICD-10-CM

## 2024-04-23 DIAGNOSIS — R059 Cough, unspecified: Secondary | ICD-10-CM | POA: Diagnosis not present

## 2024-04-23 LAB — POC COVID19/FLU A&B COMBO
Covid Antigen, POC: NEGATIVE
Influenza A Antigen, POC: NEGATIVE
Influenza B Antigen, POC: NEGATIVE

## 2024-04-23 MED ORDER — PREDNISONE 10 MG PO TABS: ORAL_TABLET | ORAL | 0 refills | Status: AC

## 2024-04-23 MED ORDER — DOXYCYCLINE HYCLATE 100 MG PO TABS: 100.0000 mg | ORAL_TABLET | Freq: Two times a day (BID) | ORAL | 0 refills | Status: DC

## 2024-04-23 NOTE — Progress Notes (Signed)
 [facility] [address] [place], [place] [address]   Office Visit Note  Patient Name: Molly Peterson Date of Birth [id number]  Medical Record number [id number]  Date of Service: 04/23/2024  Chief Complaint  Patient presents with   Sick visit    Patient c/o productive cough, rhinorrhea, body aches, and headache. She feels symptoms have worsened since her initial visit 3 days ago. Cough syrup with codeine  kept her awake but did not suppress cough. She took 2 Excedrin and Mucinex  this morning.     HPI Pt is here for a sick visit. She reports headache, cough, runny nose and fatigue are worse.    Current Medication:  Outpatient Encounter Medications as of 04/23/2024  Medication Sig   albuterol  (VENTOLIN  HFA) 108 (90 Base) MCG/ACT inhaler Inhale 2 puffs into the lungs every 6 (six) hours as needed for wheezing or shortness of breath.   ALPRAZolam  (XANAX ) 0.25 MG tablet Take 1 tablet (0.25 mg total) by mouth daily as needed for anxiety.   doxycycline  (VIBRA -TABS) 100 MG tablet Take 1 tablet (100 mg total) by mouth 2 (two) times daily.   estradiol  (ESTRACE ) 0.1 MG/GM vaginal cream Place 0.5 g vaginally as needed.   fluticasone  (FLONASE ) 50 MCG/ACT nasal spray Place 1 spray into both nostrils daily. (Patient taking differently: Place 1 spray into both nostrils daily as needed.)   guaiFENesin -codeine  100-10 MG/5ML syrup Take 5-10 mLs by mouth at bedtime as needed for up to 7 days for cough.   metoprolol  succinate (TOPROL -XL) 50 MG 24 hr tablet TAKE ONE TABLET BY MOUTH ONCE DAILY IMMEDIATELY FOLLOWING A MEAL   MIEBO 1.338 GM/ML SOLN Place 1 drop into both eyes 4 (four) times daily.   predniSONE  (DELTASONE ) 10 MG tablet Take 6 tablets (60 mg total) by mouth daily with breakfast for 1 day, THEN 5 tablets (50 mg total) daily with breakfast for 1 day, THEN 4 tablets (40 mg total) daily with breakfast for 1 day, THEN 3 tablets (30 mg total) daily with breakfast for 1 day, THEN 2 tablets  (20 mg total) daily with breakfast for 1 day, THEN 1 tablet (10 mg total) daily with breakfast for 1 day.   risedronate (ACTONEL) 150 MG tablet Take 150 mg by mouth every 30 (thirty) days.   rosuvastatin  (CRESTOR ) 10 MG tablet Take 1 tablet (10 mg total) by mouth daily.   [DISCONTINUED] promethazine-dextromethorphan (PROMETHAZINE-DM) 6.25-15 MG/5ML syrup Take 5 mLs by mouth 4 (four) times daily as needed for cough.   No facility-administered encounter medications on file as of 04/23/2024.      Medical History: Past Medical History:  Diagnosis Date   Acute pharyngitis    ANXIETY 09/18/2006   on PRN meds for situational anxiety   Chronic kidney disease    hx kidney stones   DEPRESSION 09/18/2006   DERMATITIS, SEBORRHEIC NEC 01/06/2007   Edema    symptom   Flatulence, eructation, and gas pain    Hyperlipidemia    on meds   HYPERTENSION 09/18/2006   on meds   Osteopenia    Palpitations 09/18/2006   REACTION, ACUTE STRESS W/EMOTIONAL DSTURB 12/23/2006   Seasonal allergies    Unspecified constipation    Urinary tract infection, site not specified      Vital Signs: BP 114/82   Pulse 77   Temp (!) 96.4 F (35.8 C)   SpO2 99%    Review of Systems  Constitutional:  Positive for fatigue. Negative for chills and fever.  HENT:  Positive for rhinorrhea. Negative for sinus pain and sore throat.   Eyes:  Negative for pain and itching.  Respiratory:  Positive for cough and chest tightness.   Cardiovascular:  Negative for chest pain.  Gastrointestinal:  Negative for diarrhea, nausea and vomiting.  Musculoskeletal:  Positive for myalgias.    Physical Exam Vitals reviewed.  Constitutional:      Appearance: Normal appearance.  HENT:     Head: Normocephalic.     Nose: Nose normal.     Mouth/Throat:     Mouth: Mucous membranes are moist.  Eyes:     Pupils: Pupils are equal, round, and reactive to light.  Pulmonary:     Effort: Pulmonary effort is normal.     Comments: Clear  but mildly diminished.  Lymphadenopathy:     Cervical: No cervical adenopathy.  Neurological:     Mental Status: She is alert.    Results for orders placed or performed in visit on 04/23/24 (from the past 24 hours)  POC Covid19/Flu A&B Antigen     Status: None   Collection Time: 04/23/24 10:15 AM  Result Value Ref Range   Influenza A Antigen, POC Negative Negative   Influenza B Antigen, POC Negative Negative   Covid Antigen, POC Negative Negative    Assessment/Plan: 1. Acute cough (Primary) Take Doxycycline , prednisone  as discussed. Get CXR and follow up in mychart. Follow up via MyChart messenger if symptoms fail to improve or may return to clinic as needed for worsening symptoms.   - POC Covid19/Flu A&B Antigen - DG Chest 2 View; Future - doxycycline  (VIBRA -TABS) 100 MG tablet; Take 1 tablet (100 mg total) by mouth 2 (two) times daily.  Dispense: 20 tablet; Refill: 0 - predniSONE  (DELTASONE ) 10 MG tablet; Take 6 tablets (60 mg total) by mouth daily with breakfast for 1 day, THEN 5 tablets (50 mg total) daily with breakfast for 1 day, THEN 4 tablets (40 mg total) daily with breakfast for 1 day, THEN 3 tablets (30 mg total) daily with breakfast for 1 day, THEN 2 tablets (20 mg total) daily with breakfast for 1 day, THEN 1 tablet (10 mg total) daily with breakfast for 1 day.  Dispense: 21 tablet; Refill: 0  2. Body aches - POC Covid19/Flu A&B Antigen     General Counseling: Molly Peterson verbalizes understanding of the findings of todays visit and agrees with plan of treatment. I have discussed any further diagnostic evaluation that may be needed or ordered today. We also reviewed her medications today. she has been encouraged to call the office with any questions or concerns that should arise related to todays visit.   Orders Placed This Encounter  Procedures   DG Chest 2 View   POC Covid19/Flu A&B Antigen    Meds ordered this encounter  Medications   doxycycline  (VIBRA -TABS) 100 MG  tablet    Sig: Take 1 tablet (100 mg total) by mouth 2 (two) times daily.    Dispense:  20 tablet    Refill:  0   predniSONE  (DELTASONE ) 10 MG tablet    Sig: Take 6 tablets (60 mg total) by mouth daily with breakfast for 1 day, THEN 5 tablets (50 mg total) daily with breakfast for 1 day, THEN 4 tablets (40 mg total) daily with breakfast for 1 day, THEN 3 tablets (30 mg total) daily with breakfast for 1 day, THEN 2 tablets (20 mg total) daily with breakfast for 1 day, THEN 1 tablet (10 mg total) daily with  breakfast for 1 day.    Dispense:  21 tablet    Refill:  0    Time spent:15 Minutes    Juliene DOROTHA Howells AGNP-C Nurse Practitioner

## 2024-05-01 NOTE — Telephone Encounter (Signed)
 [facility] representative confirmed patient was setup with CPAP on 04/13/24:  Zott, Stacy  Brayant Dorr, Damien BROCKS, RN; [place], [place]; Darrel Boyer Yes, the patient was set up on 04/13/24.  Forwarding to scheduler for assistance arranging compliance appointment.

## 2024-05-06 ENCOUNTER — Other Ambulatory Visit: Payer: Self-pay | Admitting: Nurse Practitioner

## 2024-05-06 DIAGNOSIS — F419 Anxiety disorder, unspecified: Secondary | ICD-10-CM

## 2024-05-06 NOTE — Telephone Encounter (Signed)
 Copied from CRM #[id number]. Topic: Clinical - Medication Refill >> May 06, 2024  8:24 AM Larissa RAMAN wrote: Medication:  ALPRAZolam  (XANAX ) 0.25 MG tablet  Has the patient contacted their pharmacy? Yes (Agent: If no, request that the patient contact the pharmacy for the refill. If patient does not wish to contact the pharmacy document the reason why and proceed with request.) (Agent: If yes, when and what did the pharmacy advise?)  This is the patient's preferred pharmacy:  [facility] [address] [facility] - [place], [place] - [address] [facility] [address] [place] [address] Phone: [phone] Fax: [phone]   Is this the correct pharmacy for this prescription? Yes If no, delete pharmacy and type the correct one.   Has the prescription been filled recently? No  Is the patient out of the medication? Yes  Has the patient been seen for an appointment in the last year OR does the patient have an upcoming appointment? Yes  Can we respond through MyChart? Yes  Agent: Please be advised that Rx refills may take up to 3 business days. We ask that you follow-up with your pharmacy.

## 2024-05-13 ENCOUNTER — Other Ambulatory Visit: Payer: Self-pay | Admitting: Nurse Practitioner

## 2024-05-13 ENCOUNTER — Encounter: Payer: Self-pay | Admitting: Nurse Practitioner

## 2024-05-13 DIAGNOSIS — F419 Anxiety disorder, unspecified: Secondary | ICD-10-CM

## 2024-05-18 NOTE — Telephone Encounter (Signed)
 Can we call the pharmacy and make sure they got the alprazolam  refill sent in on 05/15/2024

## 2024-05-20 MED ORDER — ALPRAZOLAM 0.25 MG PO TABS: ORAL_TABLET | ORAL | 0 refills | Status: AC

## 2024-05-20 NOTE — Telephone Encounter (Signed)
 Contacted [facility] pharmacy. Associate stated that there were no refills sent in.

## 2024-05-20 NOTE — Telephone Encounter (Signed)
I have resent the medication

## 2024-05-25 ENCOUNTER — Telehealth: Payer: Self-pay | Admitting: *Deleted

## 2024-05-25 ENCOUNTER — Telehealth: Payer: Self-pay | Admitting: Internal Medicine

## 2024-05-25 MED ORDER — METOPROLOL SUCCINATE ER 50 MG PO TB24: 50.0000 mg | ORAL_TABLET | Freq: Two times a day (BID) | ORAL | 3 refills | Status: AC

## 2024-05-25 MED ORDER — FUROSEMIDE 20 MG PO TABS: 20.0000 mg | ORAL_TABLET | ORAL | 0 refills | Status: AC | PRN

## 2024-05-25 NOTE — Telephone Encounter (Signed)
 Called and made patient aware of Dr. Okey recommendations to increased metoprolol  succinate 50 mg bid and lasix  20mg  as needed for swelling. Prescription sent to [facility] as requested and made patient aware to continue to check blood pressure and heart rate and send those readings. Patient verbalized an understanding.

## 2024-05-25 NOTE — Telephone Encounter (Signed)
 Patient is unable to make appt. Please advise

## 2024-05-25 NOTE — Telephone Encounter (Signed)
 Pt c/o BP issue: STAT if pt c/o blurred vision, one-sided weakness or slurred speech.  STAT if BP is GREATER than 180/120 TODAY.  STAT if BP is LESS than 90/60 and SYMPTOMATIC TODAY  1. What is your BP concern? Elevated   2. Have you taken any BP medication today? yes   3. What are your last 5 BP readings?150s/90s average in a week  4. Are you having any other symptoms (ex. Dizziness, headache, blurred vision, passed out)? Headache, a little SOB and swelling in the hands, feet and legs  Patient said in a week her BP been running to 150s/90s and her HR is always elevated in the evening around 105-110. Her hands, feet and legs are also getting swollen and she thinks she had gained 3 lbs in a week.

## 2024-05-25 NOTE — Telephone Encounter (Signed)
 Patient reports after taking Toprol  XL 50 MG as ordered per Provider. Blood pressure has ranged from  mid 158 systolic and diastolic low 90's. Patient scheduled for appt tomorrow but unable to make it. Denies dizziness, headaches, lightheadedness, no weakness or slurred speech verbalized. Per patient she has noticed swelling in her bilateral lower extremity for about a week. Per patient she has noticed palpitations  and increase in heart rate for about a month now. Made patient aware that this will be forward to Dr. Okey for advice and recommendations.  ED precaution reviewed, Pt verbalized an understanding.

## 2024-05-25 NOTE — Telephone Encounter (Signed)
 Increase metoprolol  succinate to 50 bid Add lasix  20mg  tablets   Take one as needed for swelling     Fill 30   No refill Pt needs to fill both at [facility] in [place]

## 2024-05-25 NOTE — Telephone Encounter (Signed)
 Called patient and medications orders sent per Dr. Okey to increase Toprol  XL 50mg  bid and lasix  20mg  as needed for swelling. Prescription sent to [facility] and pt made aware. Pt verbalized an understanding.

## 2024-05-25 NOTE — Telephone Encounter (Signed)
 Called patient and patient reports blood pressure 156/94 and two night was 156/98 and heart  rate 105 and per pt she was just laying in bed . Patient reports she check blood pressure and heart rate daily. Patient reports heart heart now 86 and blood pressure 148/88. She reports heart is more elevated during the night at rest. Pt denies dizziness but reports some lightheadedness.  Patient reports headache and contributes to sinuses. Patient reports swelling both legs that she noticed about week. Advise pt to wear compression stocking . Advise pt to elevate legs and pt reported low sodium diet. Patient requested an appt with provider and appt made with provider for 12/9. ED precautions reviewed with patient and patient verbalized an understanding.

## 2024-05-25 NOTE — Progress Notes (Deleted)
 Cardiology Clinic Note   Patient Name: Molly Peterson Date of Encounter: 05/25/2024  Primary Care Provider:  Wendee Lynwood HERO, NP Primary Cardiologist:  Vina Gull, MD  Patient Profile    Molly Peterson 58 year old female presents to the clinic today for evaluation of her elevated blood pressure and heart rate.  Past Medical History    Past Medical History:  Diagnosis Date   Acute pharyngitis    ANXIETY 09/18/2006   on PRN meds for situational anxiety   Chronic kidney disease    hx kidney stones   DEPRESSION 09/18/2006   DERMATITIS, SEBORRHEIC NEC 01/06/2007   Edema    symptom   Flatulence, eructation, and gas pain    Hyperlipidemia    on meds   HYPERTENSION 09/18/2006   on meds   Osteopenia    Palpitations 09/18/2006   REACTION, ACUTE STRESS W/EMOTIONAL DSTURB 12/23/2006   Seasonal allergies    Unspecified constipation    Urinary tract infection, site not specified    Past Surgical History:  Procedure Laterality Date   ABDOMINAL HYSTERECTOMY     partial-still has ovaries   APPENDECTOMY  unknown   CESAREAN SECTION     x 2   CHOLECYSTECTOMY     COLONOSCOPY  2017   MS-MAC-goor prep-int/ext hem/SSP/adenoma-5 yr recall   CYSTOSCOPY  2020   POLYPECTOMY  2017   SSP/adenoma   TONSILLECTOMY  unknown    Allergies  Allergies  Allergen Reactions   Other Anaphylaxis, Hives and Other (See Comments)    Lobster cause large blister on tongue   Sulfa  Antibiotics Other (See Comments) and Hives    Dizziness   Latex Itching and Swelling    Eye and facial swelling    History of Present Illness    Molly Peterson has a PMH of HTN, hyperlipidemia, mild aortic atherosclerosis, palpitations, chest pressure, and OSA.  She previously reported having GI issues since having cholecystectomy.  She had coronary CTA in 2019 which showed a coronary calcium  score of 0 with minimal plaque in her aorta.  Echocardiogram 2022 showed LVEF of 60-65%.  She is followed by Dr.  Gull..  During that time she noted problems with taking lisinopril .  She had been placed on spironolactone .  She had also noted issues with HCTZ.  Metoprolol  was added to her medication regimen.  She was last seen in follow-up by Dr. Gull on 02/03/2024.  She reported that she had just been to the beach.  She had walked every day.  She had been eating fish.  Her bowel symptoms had gone away.  She returned home and her symptoms returned.  It was recommended that her rosuvastatin  be increased.  Her blood pressure was noted to be 132/70.  Her pulse was 66.  She contacted the nurse triage line on 05/25/2024.  She reported elevated blood pressures in the 150s over 90s.  She noted that her heart rate had been up in the low 100s.  At the time of the call her blood pressure was 148/88 and her pulse was noted to be 86.  She did note some lightheadedness.  She denied dizziness.  She reported headache which she attributed to her sinuses.  She also noted some swelling in her lower extremities.  She was advised to elevate her legs and wear lower extremity compression stockings.  She reported eating low-sodium diet.  She was added to my schedule for today.  She presents to the clinic today for evaluation and states***.  ***  denies chest pain, shortness of breath, lower extremity edema, fatigue, palpitations, melena, hematuria, hemoptysis, diaphoresis, weakness, presyncope, syncope, orthopnea, and PND.  Essential hypertension-BP today*** Heart healthy low-sodium diet Increase physical activity as tolerated Secondary causes of hypertension reviewed Continue metoprolol  Maintain blood pressure log  Hyperlipidemia, aortic atherosclerosis-LDL***. Continue rosuvastatin  High-fiber diet  Dyspnea-no increased work of breathing today.  Stable at baseline and performing baseline physical activities. Continue Flonase  Increase physical activity as tolerated Follows with PCP  Fatigue-no increased activity intolerance or  decreased endurance.  Does have history of anemia due to B12 deficiency. Maintain well-balanced diet Increase physical activity as tolerated Follows with PCP    Home Medications    Prior to Admission medications   Medication Sig Start Date End Date Taking? Authorizing Provider  albuterol  (VENTOLIN  HFA) 108 (90 Base) MCG/ACT inhaler Inhale 2 puffs into the lungs every 6 (six) hours as needed for wheezing or shortness of breath. 10/15/23   Nahser, Aleene PARAS, MD  ALPRAZolam  (XANAX ) 0.25 MG tablet TAKE ONE TABLET BY MOUTH ONE TIME DAILY AS NEEDED FOR ANXIETY 05/20/24   Wendee Lynwood HERO, NP  doxycycline  (VIBRA -TABS) 100 MG tablet Take 1 tablet (100 mg total) by mouth 2 (two) times daily. 04/23/24   Scarboro, Adam J, NP  estradiol  (ESTRACE ) 0.1 MG/GM vaginal cream Place 0.5 g vaginally as needed. 01/18/23   [provider]  fluticasone  (FLONASE ) 50 MCG/ACT nasal spray Place 1 spray into both nostrils daily. Patient taking differently: Place 1 spray into both nostrils daily as needed.    [provider]  metoprolol  succinate (TOPROL -XL) 50 MG 24 hr tablet TAKE ONE TABLET BY MOUTH ONCE DAILY IMMEDIATELY FOLLOWING A MEAL 02/21/24   Okey Vina GAILS, MD  MIEBO 1.338 GM/ML SOLN Place 1 drop into both eyes 4 (four) times daily. 07/03/23   [provider]  risedronate (ACTONEL) 150 MG tablet Take 150 mg by mouth every 30 (thirty) days. 07/08/23   [provider]  rosuvastatin  (CRESTOR ) 10 MG tablet Take 1 tablet (10 mg total) by mouth daily. 01/28/24   Okey Vina GAILS, MD    Family History    Family History  Problem Relation Age of Onset   Colon polyps Mother    Heart disease Mother        PVC @age  70   Atrial fibrillation Mother 51   Pancreatitis Mother    Colon polyps Father    Heart disease Father        MI @ags  42 , CABG @55    Hypertension Father    Hyperlipidemia Father    Hearing loss Father    Heart attack Father 40   Asthma Father    Kidney cancer Father     Dementia Maternal Grandmother    Heart failure Maternal Grandfather    Heart attack Maternal Grandfather    Stroke Paternal Grandmother 84   Heart attack Paternal Grandfather        young   Depression Son    Colon cancer Neg Hx    Esophageal cancer Neg Hx    Stomach cancer Neg Hx    Pancreatic cancer Neg Hx    Rectal cancer Neg Hx    She indicated that her mother is alive. She indicated that her father is alive. She indicated that her maternal grandmother is deceased. She indicated that her maternal grandfather is deceased. She indicated that her paternal grandmother is deceased. She indicated that her paternal grandfather is deceased. She indicated that her son is alive. She  indicated that the status of her neg hx is unknown.  Social History    Social History   Socioeconomic History   Marital status: Married    Spouse name: Zachary   Number of children: 2   Years of education: College - [employer]   Highest education level: Not on file  Occupational History    Employer: UNEMPLOYED  Tobacco Use   Smoking status: Former    Current packs/day: 0.00    Average packs/day: 0.3 packs/day for 16.0 years (4.0 ttl pk-yrs)    Types: Cigarettes    Start date: 06/18/1988    Quit date: 06/18/2004    Years since quitting: 19.9   Smokeless tobacco: Never  Vaping Use   Vaping status: Never Used  Substance and Sexual Activity   Alcohol use: Yes    Comment: occasionally-no specific pattern, rare-wine   Drug use: No   Sexual activity: Yes    Birth control/protection: Surgical  Other Topics Concern   Not on file  Social History Narrative   Lives with [place] L[place], Husband   2 Children - Licensed Conveyancer (lives at home) and Karleen   Enjoy: not sure anymore, attending [employer] sports events in college   Work: mudlogger at [employer]   Social Drivers of [employer]: Low Risk  (08/25/2018)   Overall Financial Resource Strain (CARDIA)    Difficulty of Paying Living Expenses: Not hard  at all  Food Insecurity: Not on file  Transportation Needs: Not on file  Physical Activity: Not on file  Stress: Not on file  Social Connections: Not on file  Intimate Partner Violence: Not on file     Review of Systems    General:  No chills, fever, night sweats or weight changes.  Cardiovascular:  No chest pain, dyspnea on exertion, edema, orthopnea, palpitations, paroxysmal nocturnal dyspnea. Dermatological: No rash, lesions/masses Respiratory: No cough, dyspnea Urologic: No hematuria, dysuria Abdominal:   No nausea, vomiting, diarrhea, bright red blood per rectum, melena, or hematemesis Neurologic:  No visual changes, wkns, changes in mental status. All other systems reviewed and are otherwise negative except as noted above.  Physical Exam    VS:  There were no vitals taken for this visit. , BMI There is no height or weight on file to calculate BMI. GEN: Well nourished, well developed, in no acute distress. HEENT: normal. Neck: Supple, no JVD, carotid bruits, or masses. Cardiac: RRR, no murmurs, rubs, or gallops. No clubbing, cyanosis, edema.  Radials/DP/PT 2+ and equal bilaterally.  Respiratory:  Respirations regular and unlabored, clear to auscultation bilaterally. GI: Soft, nontender, nondistended, BS + x 4. MS: no deformity or atrophy. Skin: warm and dry, no rash. Neuro:  Strength and sensation are intact. Psych: Normal affect.  Accessory Clinical Findings    Recent Labs: 01/24/2024: ALT 14; BUN 17; Creatinine, Ser 0.89; Hemoglobin 13.7; Platelets 227; Potassium 4.7; Sodium 142; TSH 1.640   Recent Lipid Panel    Component Value Date/Time   CHOL 165 06/14/2021 0941   TRIG 211 (H) 06/14/2021 0941   HDL 44 06/14/2021 0941   CHOLHDL 3.8 06/14/2021 0941   CHOLHDL 4.8 06/01/2016 1534   VLDL 32 (H) 06/01/2016 1534   LDLCALC 85 06/14/2021 0941   LDLDIRECT 140.0 07/21/2014 0839    No BP recorded.  {Refresh Note OR Click here to enter BP  :1}***    ECG personally  reviewed by me today- ***    Echocardiogram 10/02/2022  IMPRESSIONS     1.  Left ventricular ejection fraction, by estimation, is 60 to 65%. The  left ventricle has normal function. The left ventricle has no regional  wall motion abnormalities. There is mild asymmetric left ventricular  hypertrophy of the basal-septal segment.  Left ventricular diastolic parameters are consistent with Grade I  diastolic dysfunction (impaired relaxation). The average left ventricular  global longitudinal strain is -19.4 %.   2. Right ventricular systolic function is normal. The right ventricular  size is normal. Tricuspid regurgitation signal is inadequate for assessing  PA pressure.   3. The mitral valve is normal in structure. No evidence of mitral valve  regurgitation. No evidence of mitral stenosis.   4. The aortic valve was not well visualized. Aortic valve regurgitation  is not visualized. No aortic stenosis is present.   5. The inferior vena cava is normal in size with greater than 50%  respiratory variability, suggesting right atrial pressure of 3 mmHg.   FINDINGS   Left Ventricle: Left ventricular ejection fraction, by estimation, is 60  to 65%. The left ventricle has normal function. The left ventricle has no  regional wall motion abnormalities. The average left ventricular global  longitudinal strain is -19.4 %.  The left ventricular internal cavity size was normal in size. There is  mild asymmetric left ventricular hypertrophy of the basal-septal segment.  Left ventricular diastolic parameters are consistent with Grade I  diastolic dysfunction (impaired  relaxation).   Right Ventricle: The right ventricular size is normal. No increase in  right ventricular wall thickness. Right ventricular systolic function is  normal. Tricuspid regurgitation signal is inadequate for assessing PA  pressure.   Left Atrium: Left atrial size was normal in size.   Right Atrium: Right atrial size was  normal in size.   Pericardium: There is no evidence of pericardial effusion.   Mitral Valve: The mitral valve is normal in structure. No evidence of  mitral valve regurgitation. No evidence of mitral valve stenosis.   Tricuspid Valve: The tricuspid valve is normal in structure. Tricuspid  valve regurgitation is not demonstrated. No evidence of tricuspid  stenosis.   Aortic Valve: The aortic valve was not well visualized. Aortic valve  regurgitation is not visualized. No aortic stenosis is present. Aortic  valve mean gradient measures 3.5 mmHg. Aortic valve peak gradient measures  6.1 mmHg. Aortic valve area, by VTI  measures 2.25 cm.   Pulmonic Valve: The pulmonic valve was normal in structure. Pulmonic valve  regurgitation is not visualized. No evidence of pulmonic stenosis.   Aorta: The aortic root is normal in size and structure.   Venous: The inferior vena cava is normal in size with greater than 50%  respiratory variability, suggesting right atrial pressure of 3 mmHg.   IAS/Shunts: No atrial level shunt detected by color flow Doppler.       Assessment & Plan   1.  ***   Josefa HERO. Brainard Highfill NP-C     05/25/2024, 1:10 PM [facility] [address] [place], [place] [address] Office [phone]    Notice: This dictation was prepared with Dragon dictation along with smaller phrase technology. Any transcriptional errors that result from this process are unintentional and may not be corrected upon review.   I spent***minutes examining this patient, reviewing medications, and using patient centered shared decision making involving their cardiac care.   I spent  20 minutes reviewing past medical history,  medications, and prior cardiac tests.

## 2024-05-26 ENCOUNTER — Ambulatory Visit: Admitting: General Practice

## 2024-05-27 ENCOUNTER — Ambulatory Visit: Payer: Self-pay

## 2024-05-27 NOTE — Telephone Encounter (Signed)
 noted

## 2024-05-27 NOTE — Telephone Encounter (Signed)
 FYI Only or Action Required?: FYI only for provider: appointment scheduled on 06/01/24.  Patient was last seen in primary care on 04/02/2023 by Molly Lynwood HERO, NP.  Called Nurse Triage reporting Hypertension.  Symptoms began several weeks ago.  Interventions attempted: Prescription medications: Metoprolol .  Symptoms are: unchanged.  Triage Disposition: See PCP When Office is Open (Within 3 Days)  Patient/caregiver understands and will follow disposition?: Yes  Reason for Disposition  [1] Taking BP medications AND [2] feels is having side effects (e.g., impotence, cough, dizzy upon standing)  Answer Assessment - Initial Assessment Questions Patient states that she has been having high blood pressure readings around 150/90 for the last 2 weeks. Most recent reading was 148/88 yesterday. She denies chest pain, SOB, unilateral numbness/weakness. States that she was diagnosed with Bronchitis around 04/17/24 and has not gotten back to feeling 100% yet. Does report occasional dizziness at least once/day and fatigue. Office visit advised.   1. BLOOD PRESSURE: What is your blood pressure? Did you take at least two measurements 5 minutes apart?     148/88   2. ONSET: When did you take your blood pressure?     yesterday, has not been able to check a reading since  3. HOW: How did you take your blood pressure? (e.g., automatic home BP monitor, visiting nurse)     BP monitor  4. HISTORY: Do you have a history of high blood pressure?     Yes  5. MEDICINES: Are you taking any medicines for blood pressure? Have you missed any doses recently?     No missed doses  6. OTHER SYMPTOMS: Do you have any symptoms? (e.g., blurred vision, chest pain, difficulty breathing, headache, weakness)     Occasional dizziness, fatigue  7. PREGNANCY: Is there any chance you are pregnant? When was your last menstrual period?     NA  Protocols used: Blood Pressure - High-A-AH  Copied from  CRM #[id number]. Topic: Clinical - Red Word Triage >> May 27, 2024  9:15 AM Avram MATSU wrote: Red Word that prompted transfer to Nurse Triage: bp has been really high, extreme fatigue, don't feel like herself, brain fog.

## 2024-05-31 NOTE — Progress Notes (Unsigned)
° ° ° °  Molly Fabio T. Pristine Gladhill, MD, CAQ Sports Medicine [facility] [address] [place], [address]  Phone: [phone]  FAX: [phone]  Molly Peterson - 58 y.o. female  MRN [id number]  Date of Birth: 24-Sep-1965  Date: 06/01/2024  PCP: Molly Lynwood HERO, NP  Referral: Molly Lynwood HERO, NP  No chief complaint on file.  Subjective:   Molly Peterson is a 58 y.o. very pleasant female patient with There is no height or weight on file to calculate BMI. who presents with the following:  Discussed the use of AI scribe software for clinical note transcription with the patient, who gave verbal consent to proceed.  Patient here is for concerns regarding hypertension.  She currently takes Lasix  20 mg, metoprolol  XL 50 mg. History of Present Illness     Review of Systems is noted in the HPI, as appropriate  Objective:   There were no vitals taken for this visit.  GEN: No acute distress; alert,appropriate. PULM: Breathing comfortably in no respiratory distress PSYCH: Normally interactive.   Laboratory and Imaging Data:  Assessment and Plan:     ICD-10-CM   1. Essential hypertension  I10      Assessment & Plan   Medication Management during today's office visit: No orders of the defined types were placed in this encounter.  There are no discontinued medications.  Orders placed today for conditions managed today: No orders of the defined types were placed in this encounter.   Disposition: No follow-ups on file.  Dragon [employer] One speech-to-text software was used for transcription in this dictation.  Possible transcriptional errors can occur using Animal nutritionist.   Signed,  Jacques DASEN. Libby Goehring, MD   Outpatient Encounter Medications as of 06/01/2024  Medication Sig   albuterol  (VENTOLIN  HFA) 108 (90 Base) MCG/ACT inhaler Inhale 2 puffs into the lungs every 6 (six) hours as needed for wheezing or shortness of breath.    ALPRAZolam  (XANAX ) 0.25 MG tablet TAKE ONE TABLET BY MOUTH ONE TIME DAILY AS NEEDED FOR ANXIETY   doxycycline  (VIBRA -TABS) 100 MG tablet Take 1 tablet (100 mg total) by mouth 2 (two) times daily.   estradiol  (ESTRACE ) 0.1 MG/GM vaginal cream Place 0.5 g vaginally as needed.   fluticasone  (FLONASE ) 50 MCG/ACT nasal spray Place 1 spray into both nostrils daily. (Patient taking differently: Place 1 spray into both nostrils daily as needed.)   furosemide  (LASIX ) 20 MG tablet Take 1 tablet (20 mg total) by mouth as needed (swelling).   metoprolol  succinate (TOPROL  XL) 50 MG 24 hr tablet Take 1 tablet (50 mg total) by mouth 2 (two) times daily at 10 AM and 5 PM. Take with or immediately following a meal.   MIEBO 1.338 GM/ML SOLN Place 1 drop into both eyes 4 (four) times daily.   risedronate (ACTONEL) 150 MG tablet Take 150 mg by mouth every 30 (thirty) days.   rosuvastatin  (CRESTOR ) 10 MG tablet Take 1 tablet (10 mg total) by mouth daily.   No facility-administered encounter medications on file as of 06/01/2024.

## 2024-06-01 ENCOUNTER — Ambulatory Visit

## 2024-06-01 ENCOUNTER — Ambulatory Visit: Admitting: Family Medicine

## 2024-06-01 ENCOUNTER — Ambulatory Visit: Attending: Family Medicine

## 2024-06-01 ENCOUNTER — Encounter: Payer: Self-pay | Admitting: Family Medicine

## 2024-06-01 VITALS — BP 132/92 | HR 67 | Temp 98.2°F | Ht 62.75 in | Wt 151.0 lb

## 2024-06-01 DIAGNOSIS — R000 Tachycardia, unspecified: Secondary | ICD-10-CM

## 2024-06-01 DIAGNOSIS — F321 Major depressive disorder, single episode, moderate: Secondary | ICD-10-CM | POA: Insufficient documentation

## 2024-06-01 DIAGNOSIS — D518 Other vitamin B12 deficiency anemias: Secondary | ICD-10-CM

## 2024-06-01 DIAGNOSIS — R002 Palpitations: Secondary | ICD-10-CM

## 2024-06-01 DIAGNOSIS — F411 Generalized anxiety disorder: Secondary | ICD-10-CM

## 2024-06-01 DIAGNOSIS — R5383 Other fatigue: Secondary | ICD-10-CM

## 2024-06-01 DIAGNOSIS — I10 Essential (primary) hypertension: Secondary | ICD-10-CM

## 2024-06-01 DIAGNOSIS — F430 Acute stress reaction: Secondary | ICD-10-CM

## 2024-06-01 MED ORDER — BUPROPION HCL ER (XL) 150 MG PO TB24: 150.0000 mg | ORAL_TABLET | Freq: Every day | ORAL | 2 refills | Status: DC

## 2024-06-01 MED ORDER — ESCITALOPRAM OXALATE 5 MG PO TABS: 5.0000 mg | ORAL_TABLET | Freq: Every day | ORAL | 2 refills | Status: DC

## 2024-06-02 ENCOUNTER — Telehealth: Payer: Self-pay

## 2024-06-02 ENCOUNTER — Ambulatory Visit: Payer: Self-pay | Admitting: Family Medicine

## 2024-06-02 NOTE — Telephone Encounter (Signed)
 Looks like the lexapro  was discontinued on our  end not sure if it was sent prior to that.  We can call the pharmacy to make sure that it is canceled   She can take the xanax  as needed with the wellbutrin 

## 2024-06-02 NOTE — Telephone Encounter (Signed)
 Left voicemail for patient to call the office back.    Left a mychart message.

## 2024-06-02 NOTE — Telephone Encounter (Signed)
 Copied from CRM #[id number]. Topic: Clinical - Medication Question >> Jun 02, 2024  9:45 AM Alexandria E wrote: Reason for CRM: Patient went to the pharmacy to pick up her medication and they had Lexapro  and Wellbutrin  called in for her. Pharmacy needs PCP or prescribing provider to take Lexapro  off as the patient would only like Wellbutrin . Patient also questioning if she can take her ALPRAZolam  (XANAX ) 0.25 MG tablet as needed with the Wellbutrin .

## 2024-06-04 LAB — CBC WITH DIFFERENTIAL/PLATELET
Basophils Absolute: 0.1 x10E3/uL (ref 0.0–0.2)
Basos: 1 %
EOS (ABSOLUTE): 0.2 x10E3/uL (ref 0.0–0.4)
Eos: 4 %
Hematocrit: 43 % (ref 34.0–46.6)
Hemoglobin: 13.7 g/dL (ref 11.1–15.9)
Immature Grans (Abs): 0 x10E3/uL (ref 0.0–0.1)
Immature Granulocytes: 0 %
Lymphocytes Absolute: 2.4 x10E3/uL (ref 0.7–3.1)
Lymphs: 36 %
MCH: 29.1 pg (ref 26.6–33.0)
MCHC: 31.9 g/dL (ref 31.5–35.7)
MCV: 92 fL (ref 79–97)
Monocytes Absolute: 0.5 x10E3/uL (ref 0.1–0.9)
Monocytes: 7 %
Neutrophils Absolute: 3.5 x10E3/uL (ref 1.4–7.0)
Neutrophils: 52 %
Platelets: 244 x10E3/uL (ref 150–450)
RBC: 4.7 x10E6/uL (ref 3.77–5.28)
RDW: 12.5 % (ref 11.7–15.4)
WBC: 6.6 x10E3/uL (ref 3.4–10.8)

## 2024-06-04 LAB — HEMOGLOBIN A1C
Est. average glucose Bld gHb Est-mCnc: 111 mg/dL
Hgb A1c MFr Bld: 5.5 % (ref 4.8–5.6)

## 2024-06-04 LAB — VITAMIN B12: Vitamin B-12: 411 pg/mL (ref 232–1245)

## 2024-06-04 LAB — BASIC METABOLIC PANEL WITH GFR
BUN/Creatinine Ratio: 24 — ABNORMAL HIGH (ref 9–23)
BUN: 19 mg/dL (ref 6–24)
CO2: 28 mmol/L (ref 20–29)
Calcium: 9.5 mg/dL (ref 8.7–10.2)
Chloride: 103 mmol/L (ref 96–106)
Creatinine, Ser: 0.78 mg/dL (ref 0.57–1.00)
Glucose: 89 mg/dL (ref 70–99)
Potassium: 5 mmol/L (ref 3.5–5.2)
Sodium: 142 mmol/L (ref 134–144)
eGFR: 88 mL/min/1.73 (ref >59)

## 2024-06-04 LAB — HEPATIC FUNCTION PANEL
ALT: 16 IU/L (ref 0–32)
AST: 16 IU/L (ref 0–40)
Albumin: 4.5 g/dL (ref 3.8–4.9)
Alkaline Phosphatase: 59 IU/L (ref 49–135)
Bilirubin Total: 0.4 mg/dL (ref 0.0–1.2)
Bilirubin, Direct: 0.1 mg/dL (ref 0.00–0.40)
Total Protein: 6.7 g/dL (ref 6.0–8.5)

## 2024-06-04 LAB — T3, FREE: T3, Free: 3.4 pg/mL (ref 2.0–4.4)

## 2024-06-04 LAB — T4, FREE: Free T4: 1.1 ng/dL (ref 0.82–1.77)

## 2024-06-04 LAB — TSH: TSH: 1.4 u[IU]/mL (ref 0.450–4.500)

## 2024-06-25 ENCOUNTER — Encounter: Payer: Self-pay | Admitting: Internal Medicine

## 2024-06-29 NOTE — Telephone Encounter (Signed)
 Reviewed with pt  BP came down when rechecked

## 2024-07-17 ENCOUNTER — Telehealth: Payer: Self-pay | Admitting: Internal Medicine

## 2024-07-17 NOTE — Telephone Encounter (Signed)
 Called pt   She had texted   Suddne onset R scapular /shoulder pain  Pleuritic   Denies SOB     No injury/overexertion No L sided CP      No F/C    No LE swelling   Recomm:  Trial motrin   Follow     Sounds pleuritic or musculosckeletal Call back if worsens/changes

## 2024-07-17 NOTE — Telephone Encounter (Signed)
 Pt states she was getting ready this morning and started having severe sharp pain in R shoulder/shoulder blade and has gotten so severe it hurts to move, hurts to breathe too deeply.  Pt also states the only thing different this am is taking her osteoporosis medication that is prescribed. Pt asking if this could be muscle pain, side effect from Rifedronate, or if she slept wrong. Pt states this feels like a muscle pain but has done nothing different this morning. Pt asking if she can take some Motrin for this but doesn't want to mask if it is heart attack related. Has done absolutely nothing different this morning to have this pain. No chest tightness, no chest pain, 148/86 after this started, and no other symptoms. Pt given ED precautions or to call 911 if worsens. Will send to DOD for further advisement.

## 2024-07-17 NOTE — Telephone Encounter (Signed)
" °  Pt c/o of Chest Pain: STAT if active CP, including tightness, pressure, jaw pain, radiating pain to shoulder/upper arm/back, CP unrelieved by Nitro. Symptoms reported of SOB, nausea, vomiting, sweating.  1. Are you having CP right now?   Yes - Patient states severe pain in her upper shoulder area   2. Are you experiencing any other symptoms (ex. SOB, nausea, vomiting, sweating)?   No  3. Is your CP continuous or coming and going?   Continuous  4. Have you taken Nitroglycerin ?   No  5. How long have you been experiencing CP?   Started about 20-30 minutes ago    6. If NO CP at time of call then end call with telling Pt to call back or call 911 if Chest pain returns prior to return call from triage team.   Patient stated it hurts to breathe.  Patient is concerned her pain may be heart related. "

## 2024-07-17 NOTE — Telephone Encounter (Signed)
 Pt contacted. See phone note.

## 2024-07-23 ENCOUNTER — Ambulatory Visit: Admitting: Nurse Practitioner

## 2024-07-23 VITALS — BP 126/82 | HR 60 | Temp 98.1°F | Ht 62.75 in | Wt 151.2 lb

## 2024-07-23 DIAGNOSIS — F419 Anxiety disorder, unspecified: Secondary | ICD-10-CM

## 2024-07-23 DIAGNOSIS — F321 Major depressive disorder, single episode, moderate: Secondary | ICD-10-CM

## 2024-07-23 MED ORDER — SERTRALINE HCL 50 MG PO TABS: ORAL_TABLET | ORAL | 0 refills | Status: AC

## 2024-07-23 NOTE — Patient Instructions (Addendum)
 Nice to see you today Stop the wellbutrin  and start the sertraline  (zoloft ) Follow up with me in 6 weeks  [facility]  Address: [address] [place], [place], [place] [address] Hours:  Closes soon ? 5?PM ? Opens 7?AM Tue Confirmed by this business 7 weeks ago Phone: [phone]

## 2024-07-23 NOTE — Progress Notes (Signed)
 "  Established Patient Office Visit  Subjective   Patient ID: Molly Peterson, female    DOB: 1965/10/23  Age: 59 y.o. MRN: [id number]  Chief Complaint  Patient presents with   Follow-up    HPI  Discussed the use of AI scribe software for clinical note transcription with the patient, who gave verbal consent to proceed.  History of Present Illness Molly Peterson is a 59 year old female with supraventricular tachycardia who presents with palpitations and anxiety.  She has been experiencing palpitations and a fast heart rate for several weeks. She is currently taking Wellbutrin , but notes no significant improvement in energy, focus, or mood. She experienced dizziness after missing doses for two consecutive days.  She has a history of supraventricular tachycardia and is on metoprolol , taking 75 to 100 mg daily, adjusting the dose based on blood pressure readings. She reports episodes of her heart 'flying' and has been using a heart monitor, which recorded heart rates between 48 and 174 bpm, with the longest episode lasting 15 seconds.  She describes a recent episode of severe shoulder pain, likened to a 'knife in my shoulder blade,' accompanied by difficulty breathing. She contacted her cardiologist, who spoke with her during the episode and discussed her symptoms. She was prescribed prednisone  and a muscle relaxer, which made her sleepy but did not fully alleviate the pain.  This was evaluated and prescribed by orthopedist  She experiences anxiety, describing a panic attack while driving, with symptoms of chest tightness and heaviness. She feels her weight contributes to her discomfort, particularly in her breasts, which she describes as heavy and lacking support. She is concerned about her posture and the impact of her work environment, although she uses a standing desk and tries to stay active.    Review of Systems  Constitutional:  Negative for chills and fever.  Respiratory:   Negative for shortness of breath.   Cardiovascular:  Negative for chest pain.  Musculoskeletal:  Positive for joint pain.  Psychiatric/Behavioral:  Negative for hallucinations and suicidal ideas.       Objective:     BP 126/82   Pulse 60   Temp 98.1 F (36.7 C) (Oral)   Ht 5' 2.75 (1.594 m)   Wt 151 lb 3.2 oz (68.6 kg)   SpO2 99%   BMI 27.00 kg/m  BP Readings from Last 3 Encounters:  07/23/24 126/82  06/01/24 (!) 132/92  04/23/24 114/82   Wt Readings from Last 3 Encounters:  07/23/24 151 lb 3.2 oz (68.6 kg)  06/01/24 151 lb (68.5 kg)  04/20/24 149 lb (67.6 kg)   SpO2 Readings from Last 3 Encounters:  07/23/24 99%  06/01/24 99%  04/23/24 99%      Physical Exam Vitals and nursing note reviewed.  Constitutional:      Appearance: Normal appearance.  Cardiovascular:     Rate and Rhythm: Normal rate and regular rhythm.     Heart sounds: Normal heart sounds.  Pulmonary:     Effort: Pulmonary effort is normal.     Breath sounds: Normal breath sounds.  Neurological:     Mental Status: She is alert.      No results found for any visits on 07/23/24.    The ASCVD Risk score (Arnett DK, et al., 2019) failed to calculate for the following reasons:   Cannot find a previous HDL lab    Assessment & Plan:   Problem List Items Addressed This Visit  Other   Anxiety - Primary   Relevant Medications   sertraline  (ZOLOFT ) 50 MG tablet   Current moderate episode of major depressive disorder without prior episode (HCC)   Relevant Medications   sertraline  (ZOLOFT ) 50 MG tablet   Assessment and Plan Assessment & Plan Anxiety with panic attacks Wellbutrin  ineffective, may worsen anxiety. Zoloft  considered for anxiety and depression. Discussed Zoloft  side effects and therapy importance. - Initiated Zoloft  25 mg at night for 10 days, then 50 mg daily. - Follow-up in 6 weeks to assess Zoloft  response. - Discussed therapy as long-term  management.  Supraventricular tachycardia Heart rate 48-174 bpm, longest tachycardia 15 seconds. Metoprolol  for rate and BP control. Awaiting electrophysiologist's interpretation. - Continue metoprolol , adjust dose based on BP. - Await electrophysiologist's heart monitor interpretation.  Cervical radiculopathy with shoulder pain Suspected cervical radiculopathy due to nerve-related shoulder pain. Orthopedic evaluation suggests pinched nerve or rotator cuff issues. - Appointment with Dr. Ubaldo for further evaluation. - Consider imaging studies as needed.  Chronic sacrococcygeal pain Chronic pain since childbirth, managed with exercise. Previous evaluations indicated no need for injections. - Continue exercise management.   Return in about 6 weeks (around 09/03/2024) for MDD.    Adina Crandall, NP  "

## 2024-07-27 ENCOUNTER — Ambulatory Visit: Admitting: Family Medicine

## 2024-09-07 ENCOUNTER — Ambulatory Visit: Admitting: Nurse Practitioner
